# Patient Record
Sex: Female | Born: 1955 | Race: White | Hispanic: No | Smoking: Never smoker
Health system: Southern US, Community
[De-identification: ages and names within clinical notes are randomized; demographics above are authoritative.]

## PROBLEM LIST (undated history)

## (undated) DIAGNOSIS — F419 Anxiety disorder, unspecified: Secondary | ICD-10-CM

## (undated) DIAGNOSIS — E876 Hypokalemia: Secondary | ICD-10-CM

## (undated) DIAGNOSIS — F329 Major depressive disorder, single episode, unspecified: Secondary | ICD-10-CM

## (undated) DIAGNOSIS — R112 Nausea with vomiting, unspecified: Secondary | ICD-10-CM

## (undated) DIAGNOSIS — F32A Depression, unspecified: Secondary | ICD-10-CM

## (undated) DIAGNOSIS — Z9889 Other specified postprocedural states: Secondary | ICD-10-CM

## (undated) DIAGNOSIS — M47816 Spondylosis without myelopathy or radiculopathy, lumbar region: Secondary | ICD-10-CM

## (undated) DIAGNOSIS — R29898 Other symptoms and signs involving the musculoskeletal system: Secondary | ICD-10-CM

## (undated) DIAGNOSIS — G4730 Sleep apnea, unspecified: Secondary | ICD-10-CM

## (undated) DIAGNOSIS — K219 Gastro-esophageal reflux disease without esophagitis: Secondary | ICD-10-CM

## (undated) DIAGNOSIS — M549 Dorsalgia, unspecified: Secondary | ICD-10-CM

## (undated) DIAGNOSIS — I10 Essential (primary) hypertension: Secondary | ICD-10-CM

## (undated) HISTORY — DX: Depression, unspecified: F32.A

## (undated) HISTORY — PX: NECK SURGERY: SHX720

## (undated) HISTORY — PX: CHOLECYSTECTOMY: SHX55

## (undated) HISTORY — DX: Anxiety disorder, unspecified: F41.9

## (undated) HISTORY — DX: Other symptoms and signs involving the musculoskeletal system: R29.898

## (undated) HISTORY — PX: BACK SURGERY: SHX140

## (undated) HISTORY — DX: Dorsalgia, unspecified: M54.9

## (undated) HISTORY — DX: Major depressive disorder, single episode, unspecified: F32.9

## (undated) HISTORY — PX: ABDOMINAL HYSTERECTOMY: SHX81

## (undated) HISTORY — DX: Spondylosis without myelopathy or radiculopathy, lumbar region: M47.816

## (undated) HISTORY — PX: APPENDECTOMY: SHX54

---

## 1999-06-08 ENCOUNTER — Encounter: Payer: Self-pay | Admitting: Nephrology

## 1999-06-08 ENCOUNTER — Observation Stay (HOSPITAL_COMMUNITY): Admission: AD | Admit: 1999-06-08 | Discharge: 1999-06-09 | Payer: Self-pay | Admitting: Nephrology

## 2003-11-21 ENCOUNTER — Encounter: Admission: RE | Admit: 2003-11-21 | Discharge: 2003-11-21 | Payer: Self-pay | Admitting: Orthopedic Surgery

## 2010-06-22 ENCOUNTER — Emergency Department (HOSPITAL_BASED_OUTPATIENT_CLINIC_OR_DEPARTMENT_OTHER)
Admission: EM | Admit: 2010-06-22 | Discharge: 2010-06-22 | Disposition: A | Discharge status: Home / Self Care | Payer: Managed Care, Other (non HMO) | Attending: Emergency Medicine | Admitting: Emergency Medicine

## 2010-06-22 DIAGNOSIS — E119 Type 2 diabetes mellitus without complications: Secondary | ICD-10-CM | POA: Insufficient documentation

## 2010-06-22 DIAGNOSIS — E860 Dehydration: Secondary | ICD-10-CM | POA: Insufficient documentation

## 2010-06-22 DIAGNOSIS — I951 Orthostatic hypotension: Secondary | ICD-10-CM | POA: Insufficient documentation

## 2010-06-22 DIAGNOSIS — E785 Hyperlipidemia, unspecified: Secondary | ICD-10-CM | POA: Insufficient documentation

## 2010-06-22 DIAGNOSIS — J45909 Unspecified asthma, uncomplicated: Secondary | ICD-10-CM | POA: Insufficient documentation

## 2010-06-22 DIAGNOSIS — I10 Essential (primary) hypertension: Secondary | ICD-10-CM | POA: Insufficient documentation

## 2010-06-22 DIAGNOSIS — E876 Hypokalemia: Secondary | ICD-10-CM | POA: Insufficient documentation

## 2010-06-22 DIAGNOSIS — Z79899 Other long term (current) drug therapy: Secondary | ICD-10-CM | POA: Insufficient documentation

## 2010-06-22 DIAGNOSIS — G8929 Other chronic pain: Secondary | ICD-10-CM | POA: Insufficient documentation

## 2010-06-22 LAB — BASIC METABOLIC PANEL
BUN: 24 mg/dL — ABNORMAL HIGH (ref 6–23)
CO2: 27 mEq/L (ref 19–32)
Calcium: 11.1 mg/dL — ABNORMAL HIGH (ref 8.4–10.5)
Chloride: 86 mEq/L — ABNORMAL LOW (ref 96–112)
Creatinine, Ser: 0.9 mg/dL (ref 0.4–1.2)
GFR calc Af Amer: >60 mL/min (ref >60)
GFR calc non Af Amer: >60 mL/min (ref >60)
Glucose, Bld: 273 mg/dL — ABNORMAL HIGH (ref 70–99)
Potassium: 2.8 mEq/L — ABNORMAL LOW (ref 3.5–5.1)
Sodium: 132 mEq/L — ABNORMAL LOW (ref 135–145)

## 2010-06-22 LAB — CBC
HCT: 44.4 % (ref 36.0–46.0)
Hemoglobin: 15.6 g/dL — ABNORMAL HIGH (ref 12.0–15.0)
MCH: 28.7 pg (ref 26.0–34.0)
MCHC: 35.1 g/dL (ref 30.0–36.0)
MCV: 81.6 fL (ref 78.0–100.0)
Platelets: 385 10*3/uL (ref 150–400)
RBC: 5.44 MIL/uL — ABNORMAL HIGH (ref 3.87–5.11)
RDW: 13.1 % (ref 11.5–15.5)
WBC: 13.9 10*3/uL — ABNORMAL HIGH (ref 4.0–10.5)

## 2010-06-22 LAB — CK TOTAL AND CKMB (NOT AT ARMC)
CK, MB: 1.7 ng/mL (ref 0.3–4.0)
Relative Index: 1.4 (ref 0.0–2.5)
Total CK: 123 U/L (ref 7–177)

## 2010-06-22 LAB — URINALYSIS, ROUTINE W REFLEX MICROSCOPIC
Bilirubin Urine: NEGATIVE
Glucose, UA: 250 mg/dL — AB
Hgb urine dipstick: NEGATIVE
Ketones, ur: NEGATIVE mg/dL
Nitrite: NEGATIVE
Protein, ur: NEGATIVE mg/dL
Specific Gravity, Urine: 1.016 (ref 1.005–1.030)
Urobilinogen, UA: 0.2 mg/dL (ref 0.0–1.0)
pH: 8 (ref 5.0–8.0)

## 2010-06-22 LAB — MAGNESIUM: Magnesium: 1.7 mg/dL (ref 1.5–2.5)

## 2010-06-22 LAB — DIFFERENTIAL
Basophils Absolute: 0 10*3/uL (ref 0.0–0.1)
Basophils Relative: 0 % (ref 0–1)
Eosinophils Absolute: 0 10*3/uL (ref 0.0–0.7)
Eosinophils Relative: 0 % (ref 0–5)
Lymphocytes Relative: 21 % (ref 12–46)
Lymphs Abs: 2.9 10*3/uL (ref 0.7–4.0)
Monocytes Absolute: 0.8 10*3/uL (ref 0.1–1.0)
Monocytes Relative: 6 % (ref 3–12)
Neutro Abs: 10.2 10*3/uL — ABNORMAL HIGH (ref 1.7–7.7)
Neutrophils Relative %: 73 % (ref 43–77)

## 2010-06-22 LAB — TROPONIN I: Troponin I: <0.3 ng/mL (ref <0.30)

## 2010-06-22 LAB — GLUCOSE, CAPILLARY: Glucose-Capillary: 317 mg/dL — ABNORMAL HIGH (ref 70–99)

## 2010-09-15 ENCOUNTER — Encounter: Payer: Self-pay | Admitting: Emergency Medicine

## 2010-09-15 ENCOUNTER — Emergency Department (INDEPENDENT_AMBULATORY_CARE_PROVIDER_SITE_OTHER): Payer: Managed Care, Other (non HMO)

## 2010-09-15 ENCOUNTER — Emergency Department (HOSPITAL_BASED_OUTPATIENT_CLINIC_OR_DEPARTMENT_OTHER)
Admission: EM | Admit: 2010-09-15 | Discharge: 2010-09-15 | Disposition: A | Discharge status: Other Acute Inpatient Hospital | Payer: Managed Care, Other (non HMO) | Attending: Emergency Medicine | Admitting: Emergency Medicine

## 2010-09-15 ENCOUNTER — Other Ambulatory Visit: Payer: Self-pay

## 2010-09-15 DIAGNOSIS — I959 Hypotension, unspecified: Secondary | ICD-10-CM | POA: Insufficient documentation

## 2010-09-15 DIAGNOSIS — R109 Unspecified abdominal pain: Secondary | ICD-10-CM | POA: Insufficient documentation

## 2010-09-15 DIAGNOSIS — E119 Type 2 diabetes mellitus without complications: Secondary | ICD-10-CM | POA: Insufficient documentation

## 2010-09-15 DIAGNOSIS — R42 Dizziness and giddiness: Secondary | ICD-10-CM | POA: Insufficient documentation

## 2010-09-15 DIAGNOSIS — I10 Essential (primary) hypertension: Secondary | ICD-10-CM | POA: Insufficient documentation

## 2010-09-15 DIAGNOSIS — E876 Hypokalemia: Secondary | ICD-10-CM | POA: Insufficient documentation

## 2010-09-15 DIAGNOSIS — Z79899 Other long term (current) drug therapy: Secondary | ICD-10-CM | POA: Insufficient documentation

## 2010-09-15 DIAGNOSIS — R079 Chest pain, unspecified: Secondary | ICD-10-CM

## 2010-09-15 HISTORY — DX: Essential (primary) hypertension: I10

## 2010-09-15 HISTORY — DX: Gastro-esophageal reflux disease without esophagitis: K21.9

## 2010-09-15 HISTORY — DX: Hypokalemia: E87.6

## 2010-09-15 LAB — CBC
HCT: 41.6 % (ref 36.0–46.0)
Hemoglobin: 14.4 g/dL (ref 12.0–15.0)
MCH: 29.4 pg (ref 26.0–34.0)
MCHC: 34.6 g/dL (ref 30.0–36.0)
MCV: 85.1 fL (ref 78.0–100.0)
Platelets: 394 10*3/uL (ref 150–400)
RBC: 4.89 MIL/uL (ref 3.87–5.11)
RDW: 13.1 % (ref 11.5–15.5)
WBC: 9.8 10*3/uL (ref 4.0–10.5)

## 2010-09-15 LAB — URINALYSIS, ROUTINE W REFLEX MICROSCOPIC
Bilirubin Urine: NEGATIVE
Glucose, UA: NEGATIVE mg/dL
Hgb urine dipstick: NEGATIVE
Ketones, ur: NEGATIVE mg/dL
Leukocytes, UA: NEGATIVE
Nitrite: NEGATIVE
Protein, ur: NEGATIVE mg/dL
Specific Gravity, Urine: 1.008 (ref 1.005–1.030)
Urobilinogen, UA: 0.2 mg/dL (ref 0.0–1.0)
pH: 7.5 (ref 5.0–8.0)

## 2010-09-15 LAB — DIFFERENTIAL
Basophils Absolute: 0 10*3/uL (ref 0.0–0.1)
Basophils Relative: 0 % (ref 0–1)
Eosinophils Absolute: 0.2 10*3/uL (ref 0.0–0.7)
Eosinophils Relative: 2 % (ref 0–5)
Lymphocytes Relative: 40 % (ref 12–46)
Lymphs Abs: 3.9 10*3/uL (ref 0.7–4.0)
Monocytes Absolute: 0.8 10*3/uL (ref 0.1–1.0)
Monocytes Relative: 8 % (ref 3–12)
Neutro Abs: 4.9 10*3/uL (ref 1.7–7.7)
Neutrophils Relative %: 49 % (ref 43–77)

## 2010-09-15 LAB — COMPREHENSIVE METABOLIC PANEL
ALT: 20 U/L (ref 0–35)
AST: 31 U/L (ref 0–37)
Albumin: 4.2 g/dL (ref 3.5–5.2)
Alkaline Phosphatase: 84 U/L (ref 39–117)
BUN: 12 mg/dL (ref 6–23)
CO2: 32 mEq/L (ref 19–32)
Calcium: 9.9 mg/dL (ref 8.4–10.5)
Chloride: 95 mEq/L — ABNORMAL LOW (ref 96–112)
Creatinine, Ser: 0.7 mg/dL (ref 0.50–1.10)
GFR calc Af Amer: >60 mL/min (ref >60)
GFR calc non Af Amer: >60 mL/min (ref >60)
Glucose, Bld: 94 mg/dL (ref 70–99)
Potassium: 2.9 mEq/L — ABNORMAL LOW (ref 3.5–5.1)
Sodium: 138 mEq/L (ref 135–145)
Total Bilirubin: 0.4 mg/dL (ref 0.3–1.2)
Total Protein: 7.8 g/dL (ref 6.0–8.3)

## 2010-09-15 LAB — POTASSIUM: Potassium: 2.9 mEq/L — ABNORMAL LOW (ref 3.5–5.1)

## 2010-09-15 MED ORDER — SODIUM CHLORIDE 0.9 % IV BOLUS (SEPSIS)
1000.0000 mL | Freq: Once | INTRAVENOUS | Status: AC
  Administered 2010-09-15: 1000 mL via INTRAVENOUS

## 2010-09-15 MED ORDER — POTASSIUM CHLORIDE 10 MEQ/100ML IV SOLN
10.0000 meq | Freq: Once | INTRAVENOUS | Status: AC
  Administered 2010-09-15: 10 meq via INTRAVENOUS
  Filled 2010-09-15: qty 100

## 2010-09-15 NOTE — ED Notes (Signed)
Pt ambulatory to [facility] with asst.; sts she feels "wobbly" when ambulating

## 2010-09-15 NOTE — ED Provider Notes (Signed)
History     CSN: [id number] Arrival date & time: 09/15/2010 11:18 AM  Chief Complaint  Patient presents with  . Abdominal Pain   HPI Comments: Pt states that over the last several days she has had dizziness,nausea and weakness. Pt states that last week she was admitted to [facility] for a syncopal episode and was told that she had low potassium:pt states that she has been taking supplement and had lab work drawn by pcp 2 days ago and was told that it was back down to 3:pt states that she is having muscle cramping and nausea from taking the potasium. Pt denies abdominal pain she just feels sick to her stomach.Pt denies fever, cp.  The history is provided by the patient. No language interpreter was used.    Past Medical History  Diagnosis Date  . Diabetes mellitus   . Hypokalemia   . GERD (gastroesophageal reflux disease)   . Asthma   . Hypertension     Past Surgical History  Procedure Date  . Cholecystectomy   . Appendectomy   . Abdominal hysterectomy   . Back surgery   . Neck surgery     No family history on file.  History  Substance Use Topics  . Smoking status: Never Smoker   . Smokeless tobacco: Not on file  . Alcohol Use: No    OB History    Grav Para Term Preterm Abortions TAB SAB Ect Mult Living                  Review of Systems  All other systems reviewed and are negative.    Physical Exam  BP 91/61  Pulse 78  Temp(Src) 98 F (36.7 C) (Oral)  Resp 16  Ht 5\' 3"  (1.6 m)  Wt 162 lb (73.483 kg)  BMI 28.70 kg/m2  SpO2 98%  Physical Exam  Nursing note and vitals reviewed. Constitutional: She appears well-developed and well-nourished.  HENT:  Head: Normocephalic and atraumatic.  Eyes: Pupils are equal, round, and reactive to light.  Neck: Normal range of motion. Neck supple.  Cardiovascular: Regular rhythm.   Pulmonary/Chest: Breath sounds normal.  Abdominal: Soft. Bowel sounds are normal.  Musculoskeletal: Normal range of motion.    Neurological: She is alert. Coordination normal.  Skin: Skin is warm and dry.  Psychiatric: She has a normal mood and affect.    ED Course  Procedures  Date: 09/15/2010  Rate: 69  Rhythm: normal sinus rhythm  QRS Axis: normal  Intervals: normal  ST/T Wave abnormalities: normal  Conduction Disutrbances:none  Narrative Interpretation:   Old EKG Reviewed: none available  Results for orders placed during the hospital encounter of 09/15/10  CBC      Component Value Range   WBC 9.8  4.0 - 10.5 (K/uL)   RBC 4.89  3.87 - 5.11 (MIL/uL)   Hemoglobin 14.4  12.0 - 15.0 (g/dL)   HCT 40.9  81.1 - 91.4 (%)   MCV 85.1  78.0 - 100.0 (fL)   MCH 29.4  26.0 - 34.0 (pg)   MCHC 34.6  30.0 - 36.0 (g/dL)   RDW 78.2  95.6 - 21.3 (%)   Platelets 394  150 - 400 (K/uL)  DIFFERENTIAL      Component Value Range   Neutrophils Relative 49  43 - 77 (%)   Neutro Abs 4.9  1.7 - 7.7 (K/uL)   Lymphocytes Relative 40  12 - 46 (%)   Lymphs Abs 3.9  0.7 - 4.0 (K/uL)  Monocytes Relative 8  3 - 12 (%)   Monocytes Absolute 0.8  0.1 - 1.0 (K/uL)   Eosinophils Relative 2  0 - 5 (%)   Eosinophils Absolute 0.2  0.0 - 0.7 (K/uL)   Basophils Relative 0  0 - 1 (%)   Basophils Absolute 0.0  0.0 - 0.1 (K/uL)  COMPREHENSIVE METABOLIC PANEL      Component Value Range   Sodium 138  135 - 145 (mEq/L)   Potassium 2.9 (*) 3.5 - 5.1 (mEq/L)   Chloride 95 (*) 96 - 112 (mEq/L)   CO2 32  19 - 32 (mEq/L)   Glucose, Bld 94  70 - 99 (mg/dL)   BUN 12  6 - 23 (mg/dL)   Creatinine, Ser 1.61  0.50 - 1.10 (mg/dL)   Calcium 9.9  8.4 - 09.6 (mg/dL)   Total Protein 7.8  6.0 - 8.3 (g/dL)   Albumin 4.2  3.5 - 5.2 (g/dL)   AST 31  0 - 37 (U/L)   ALT 20  0 - 35 (U/L)   Alkaline Phosphatase 84  39 - 117 (U/L)   Total Bilirubin 0.4  0.3 - 1.2 (mg/dL)   GFR calc non Af Amer >60  >60 (mL/min)   GFR calc Af Amer >60  >60 (mL/min)  URINALYSIS, ROUTINE W REFLEX MICROSCOPIC      Component Value Range   Color, Urine YELLOW  YELLOW     Appearance CLEAR  CLEAR    Specific Gravity, Urine 1.008  1.005 - 1.030    pH 7.5  5.0 - 8.0    Glucose, UA NEGATIVE  NEGATIVE (mg/dL)   Hgb urine dipstick NEGATIVE  NEGATIVE    Bilirubin Urine NEGATIVE  NEGATIVE    Ketones, ur NEGATIVE  NEGATIVE (mg/dL)   Protein, ur NEGATIVE  NEGATIVE (mg/dL)   Urobilinogen, UA 0.2  0.0 - 1.0 (mg/dL)   Nitrite NEGATIVE  NEGATIVE    Leukocytes, UA NEGATIVE  NEGATIVE      Results for orders placed during the hospital encounter of 09/15/10  CBC      Component Value Range   WBC 9.8  4.0 - 10.5 (K/uL)   RBC 4.89  3.87 - 5.11 (MIL/uL)   Hemoglobin 14.4  12.0 - 15.0 (g/dL)   HCT 04.5  40.9 - 81.1 (%)   MCV 85.1  78.0 - 100.0 (fL)   MCH 29.4  26.0 - 34.0 (pg)   MCHC 34.6  30.0 - 36.0 (g/dL)   RDW 91.4  78.2 - 95.6 (%)   Platelets 394  150 - 400 (K/uL)  DIFFERENTIAL      Component Value Range   Neutrophils Relative 49  43 - 77 (%)   Neutro Abs 4.9  1.7 - 7.7 (K/uL)   Lymphocytes Relative 40  12 - 46 (%)   Lymphs Abs 3.9  0.7 - 4.0 (K/uL)   Monocytes Relative 8  3 - 12 (%)   Monocytes Absolute 0.8  0.1 - 1.0 (K/uL)   Eosinophils Relative 2  0 - 5 (%)   Eosinophils Absolute 0.2  0.0 - 0.7 (K/uL)   Basophils Relative 0  0 - 1 (%)   Basophils Absolute 0.0  0.0 - 0.1 (K/uL)  COMPREHENSIVE METABOLIC PANEL      Component Value Range   Sodium 138  135 - 145 (mEq/L)   Potassium 2.9 (*) 3.5 - 5.1 (mEq/L)   Chloride 95 (*) 96 - 112 (mEq/L)   CO2 32  19 - 32 (  mEq/L)   Glucose, Bld 94  70 - 99 (mg/dL)   BUN 12  6 - 23 (mg/dL)   Creatinine, Ser 1.61  0.50 - 1.10 (mg/dL)   Calcium 9.9  8.4 - 09.6 (mg/dL)   Total Protein 7.8  6.0 - 8.3 (g/dL)   Albumin 4.2  3.5 - 5.2 (g/dL)   AST 31  0 - 37 (U/L)   ALT 20  0 - 35 (U/L)   Alkaline Phosphatase 84  39 - 117 (U/L)   Total Bilirubin 0.4  0.3 - 1.2 (mg/dL)   GFR calc non Af Amer >60  >60 (mL/min)   GFR calc Af Amer >60  >60 (mL/min)  URINALYSIS, ROUTINE W REFLEX MICROSCOPIC      Component Value Range    Color, Urine YELLOW  YELLOW    Appearance CLEAR  CLEAR    Specific Gravity, Urine 1.008  1.005 - 1.030    pH 7.5  5.0 - 8.0    Glucose, UA NEGATIVE  NEGATIVE (mg/dL)   Hgb urine dipstick NEGATIVE  NEGATIVE    Bilirubin Urine NEGATIVE  NEGATIVE    Ketones, ur NEGATIVE  NEGATIVE (mg/dL)   Protein, ur NEGATIVE  NEGATIVE (mg/dL)   Urobilinogen, UA 0.2  0.0 - 1.0 (mg/dL)   Nitrite NEGATIVE  NEGATIVE    Leukocytes, UA NEGATIVE  NEGATIVE    Dg Chest 2 View  09/15/2010  *RADIOLOGY REPORT*  Clinical Data: Dizziness.  Abdominal pain and cramping.  Chest pain.  CHEST - 2 VIEW  Comparison: None.  Findings: Lower cervical spine fixation.  Cholecystectomy clips. Midline trachea.  Normal heart size and mediastinal contours. No pleural effusion or pneumothorax.  Mildly low lung volumes. Clear lungs.  No free intraperitoneal air.  IMPRESSION: Mildly low lung volumes without acute disease.  Original Report Authenticated By: Consuello Bossier, M.D.    MDM Pt given a run of potassium and is taking 40 bid at home with continued low potassium:pt blood pressure here after 2 liters continues to be 85:will call [facility] to get admitted

## 2010-09-15 NOTE — ED Notes (Signed)
Pt reports she is having persistent abd pain & nausea, feels weak; was admitted to [facility] Sunday after syncopal episode- was found to have low K+; saw PMD Thurs, K+ was 3.0

## 2010-09-15 NOTE — ED Notes (Signed)
PCP Zollie [id number] [facility]

## 2010-09-15 NOTE — ED Notes (Signed)
Pt asymptomatic with hypotension.  Rubin Payor, NP made aware- orders received. NS rate increased to 999/hr.

## 2010-09-16 NOTE — ED Provider Notes (Signed)
Medical screening examination/treatment/procedure(s) were performed by non-physician practitioner and as supervising physician I was immediately available for consultation/collaboration.   Charles B. Bernette Mayers, MD 09/16/10 [phone]

## 2010-09-16 NOTE — ED Provider Notes (Signed)
Medical screening examination/treatment/procedure(s) were performed by non-physician practitioner and as supervising physician I was immediately available for consultation/collaboration.   Stefhanie Kachmar B. Bernette Mayers, MD 09/16/10 [phone]

## 2012-03-25 ENCOUNTER — Emergency Department (HOSPITAL_BASED_OUTPATIENT_CLINIC_OR_DEPARTMENT_OTHER)
Admission: EM | Admit: 2012-03-25 | Discharge: 2012-03-25 | Disposition: A | Discharge status: Home / Self Care | Payer: Managed Care, Other (non HMO) | Attending: Emergency Medicine | Admitting: Emergency Medicine

## 2012-03-25 ENCOUNTER — Encounter (HOSPITAL_BASED_OUTPATIENT_CLINIC_OR_DEPARTMENT_OTHER): Payer: Self-pay

## 2012-03-25 ENCOUNTER — Emergency Department (HOSPITAL_BASED_OUTPATIENT_CLINIC_OR_DEPARTMENT_OTHER): Payer: Managed Care, Other (non HMO)

## 2012-03-25 DIAGNOSIS — E876 Hypokalemia: Secondary | ICD-10-CM | POA: Insufficient documentation

## 2012-03-25 DIAGNOSIS — R197 Diarrhea, unspecified: Secondary | ICD-10-CM | POA: Insufficient documentation

## 2012-03-25 DIAGNOSIS — Z79899 Other long term (current) drug therapy: Secondary | ICD-10-CM | POA: Insufficient documentation

## 2012-03-25 DIAGNOSIS — Z794 Long term (current) use of insulin: Secondary | ICD-10-CM | POA: Insufficient documentation

## 2012-03-25 DIAGNOSIS — E119 Type 2 diabetes mellitus without complications: Secondary | ICD-10-CM | POA: Insufficient documentation

## 2012-03-25 DIAGNOSIS — R1110 Vomiting, unspecified: Secondary | ICD-10-CM

## 2012-03-25 DIAGNOSIS — I10 Essential (primary) hypertension: Secondary | ICD-10-CM | POA: Insufficient documentation

## 2012-03-25 DIAGNOSIS — J45909 Unspecified asthma, uncomplicated: Secondary | ICD-10-CM | POA: Insufficient documentation

## 2012-03-25 DIAGNOSIS — K219 Gastro-esophageal reflux disease without esophagitis: Secondary | ICD-10-CM | POA: Insufficient documentation

## 2012-03-25 DIAGNOSIS — R112 Nausea with vomiting, unspecified: Secondary | ICD-10-CM | POA: Insufficient documentation

## 2012-03-25 DIAGNOSIS — R109 Unspecified abdominal pain: Secondary | ICD-10-CM | POA: Insufficient documentation

## 2012-03-25 LAB — CBC WITH DIFFERENTIAL/PLATELET
Eosinophils Absolute: 0.1 10*3/uL (ref 0.0–0.7)
HCT: 42.1 % (ref 36.0–46.0)
Hemoglobin: 14.4 g/dL (ref 12.0–15.0)
Lymphs Abs: 4.3 10*3/uL — ABNORMAL HIGH (ref 0.7–4.0)
MCH: 29.9 pg (ref 26.0–34.0)
Monocytes Absolute: 0.6 10*3/uL (ref 0.1–1.0)
Monocytes Relative: 7 % (ref 3–12)
Neutro Abs: 3.4 10*3/uL (ref 1.7–7.7)
Neutrophils Relative %: 41 % — ABNORMAL LOW (ref 43–77)
RBC: 4.81 MIL/uL (ref 3.87–5.11)

## 2012-03-25 LAB — URINALYSIS, ROUTINE W REFLEX MICROSCOPIC
Bilirubin Urine: NEGATIVE
Glucose, UA: NEGATIVE mg/dL
Hgb urine dipstick: NEGATIVE
Specific Gravity, Urine: 1.005 (ref 1.005–1.030)

## 2012-03-25 LAB — COMPREHENSIVE METABOLIC PANEL
AST: 23 U/L (ref 0–37)
CO2: 29 mEq/L (ref 19–32)
Calcium: 9.9 mg/dL (ref 8.4–10.5)
Creatinine, Ser: 0.8 mg/dL (ref 0.50–1.10)
GFR calc non Af Amer: 81 mL/min — ABNORMAL LOW (ref >90)
Sodium: 139 mEq/L (ref 135–145)
Total Protein: 7.3 g/dL (ref 6.0–8.3)

## 2012-03-25 LAB — LIPASE, BLOOD: Lipase: 18 U/L (ref 11–59)

## 2012-03-25 MED ORDER — IOHEXOL 300 MG/ML  SOLN
50.0000 mL | Freq: Once | INTRAMUSCULAR | Status: AC | PRN
  Administered 2012-03-25: 50 mL via ORAL

## 2012-03-25 MED ORDER — ONDANSETRON HCL 4 MG/2ML IJ SOLN
4.0000 mg | Freq: Once | INTRAMUSCULAR | Status: AC
  Administered 2012-03-25: 4 mg via INTRAVENOUS
  Filled 2012-03-25: qty 2

## 2012-03-25 NOTE — ED Notes (Signed)
Pt states that she has sore throat, abdominal pain in bilateral lower abdomen, n/v with constipation.  Denies fever.  Pt has been seen by PCP, had EGD (03/17/2012) which showed gastroparesis (retained food noted in stomach at time of exam) and US performed on 03/24/2012.  Pt states that she wants a CT scan of her abdomen and IVF as per her discussion with PCP.

## 2012-03-25 NOTE — ED Provider Notes (Signed)
History     CSN: [id number]  Arrival date & time 03/25/12  1201   First MD Initiated Contact with Patient 03/25/12 1220      Chief Complaint  Patient presents with  . Abdominal Pain    (Consider location/radiation/quality/duration/timing/severity/associated sxs/prior treatment) HPI Comments: Pt states that she has had vomiting and diarrhea but in the last couple of days she has had vomiting:pt states that she has had ultrasound and egd from doctors and nothing was found:pt states that she was sent over by her pcp to have ultrasound:  Patient is a 57 y.o. female presenting with abdominal pain. The history is provided by the patient. No language interpreter was used.  Abdominal Pain Pain location:  LLQ and RLQ Pain quality: aching   Pain severity:  Moderate Onset quality:  Gradual Duration:  3 weeks Timing:  Intermittent Associated symptoms: diarrhea, nausea and vomiting   Associated symptoms: no fever     Past Medical History  Diagnosis Date  . Diabetes mellitus   . Hypokalemia   . GERD (gastroesophageal reflux disease)   . Asthma   . Hypertension     Past Surgical History  Procedure Laterality Date  . Cholecystectomy    . Appendectomy    . Abdominal hysterectomy    . Back surgery    . Neck surgery      History reviewed. No pertinent family history.  History  Substance Use Topics  . Smoking status: Never Smoker   . Smokeless tobacco: Never Used  . Alcohol Use: No    OB History   Grav Para Term Preterm Abortions TAB SAB Ect Mult Living                  Review of Systems  Constitutional: Negative for fever.  Respiratory: Negative.   Cardiovascular: Negative.   Gastrointestinal: Positive for nausea, vomiting, abdominal pain and diarrhea.    Allergies  Clarithromycin; Codeine; Ibuprofen; Darvocet; Other; Ranitidine hcl; Stadol; and Sulfa antibiotics  Home Medications   Current Outpatient Rx  Name  Route  Sig  Dispense  Refill  . acyclovir  (ZOVIRAX) 400 MG tablet   Oral   Take 400 mg by mouth 2 (two) times daily.           Marland Kitchen atenolol (TENORMIN) 25 MG tablet   Oral   Take 25 mg by mouth daily.           . budesonide-formoterol (SYMBICORT) 80-4.5 MCG/ACT inhaler   Inhalation   Inhale 2 puffs into the lungs 2 (two) times daily.           Marland Kitchen dexlansoprazole (DEXILANT) 60 MG capsule   Oral   Take 60 mg by mouth daily.           Marland Kitchen doxepin (SINEQUAN) 25 MG capsule   Oral   Take 25 mg by mouth at bedtime.           Marland Kitchen estradiol (CLIMARA) 0.1 mg/24hr   Transdermal   Place 1 patch onto the skin once a week.           . estrogen-methylTESTOSTERone (ESTRATEST) 1.25-2.5 MG per tablet   Oral   Take 1 tablet by mouth daily.           Marland Kitchen gabapentin (NEURONTIN) 600 MG tablet   Oral   Take 1,200 mg by mouth 2 (two) times daily.           Marland Kitchen guaifenesin (HUMIBID E) 400 MG TABS   Oral  Take 400 mg by mouth 2 (two) times daily.           . insulin aspart (NOVOLOG) 100 UNIT/ML injection   Subcutaneous   Inject into the skin.           Marland Kitchen levocetirizine (XYZAL) 5 MG tablet   Oral   Take 5 mg by mouth daily.           . metolazone (ZAROXOLYN) 2.5 MG tablet   Oral   Take 2.5 mg by mouth 2 (two) times daily.           . montelukast (SINGULAIR) 10 MG tablet   Oral   Take 10 mg by mouth daily.           . phentermine 37.5 MG capsule   Oral   Take 37.5 mg by mouth every morning.         . potassium chloride SA (K-DUR,KLOR-CON) 20 MEQ tablet   Oral   Take 20 mEq by mouth 2 (two) times daily.           . rosuvastatin (CRESTOR) 10 MG tablet   Oral   Take 10 mg by mouth daily.           Marland Kitchen topiramate (TOPAMAX) 25 MG capsule   Oral   Take 25 mg by mouth 2 (two) times daily.         Marland Kitchen esomeprazole (NEXIUM) 40 MG capsule   Oral   Take 40 mg by mouth at bedtime.           Marland Kitchen lubiprostone (AMITIZA) 8 MCG capsule   Oral   Take 8 mcg by mouth 2 (two) times daily with a meal.              BP 110/73  Pulse 74  Temp(Src) 97.5 F (36.4 C) (Oral)  Resp 18  Ht 5\' 3"  (1.6 m)  Wt 166 lb (75.297 kg)  BMI 29.41 kg/m2  SpO2 100%  Physical Exam  Nursing note and vitals reviewed. Constitutional: She is oriented to person, place, and time. She appears well-developed and well-nourished.  HENT:  Head: Normocephalic and atraumatic.  Eyes: Conjunctivae and EOM are normal.  Neck: Normal range of motion. Neck supple.  Cardiovascular: Normal rate and regular rhythm.   Pulmonary/Chest: Effort normal and breath sounds normal.  Abdominal: Soft. Bowel sounds are normal. There is no tenderness.  Musculoskeletal: Normal range of motion.  Neurological: She is alert and oriented to person, place, and time.  Skin: Skin is warm and dry.  Psychiatric: She has a normal mood and affect.    ED Course  Procedures (including critical care time)  Labs Reviewed  COMPREHENSIVE METABOLIC PANEL - Abnormal; Notable for the following:    Potassium 3.0 (*)    Glucose, Bld 119 (*)    GFR calc non Af Amer 81 (*)    All other components within normal limits  CBC WITH DIFFERENTIAL - Abnormal; Notable for the following:    Neutrophils Relative 41 (*)    Lymphocytes Relative 51 (*)    Lymphs Abs 4.3 (*)    All other components within normal limits  URINALYSIS, ROUTINE W REFLEX MICROSCOPIC  LIPASE, BLOOD   Ct Abdomen Pelvis Wo Contrast  03/25/2012  *RADIOLOGY REPORT*  Clinical Data: Vomiting and diarrhea since endoscopy on 03/17/2012.  CT ABDOMEN AND PELVIS WITHOUT CONTRAST  Technique:  Multidetector CT imaging of the abdomen and pelvis was performed following the standard protocol without intravenous contrast.  Comparison: None.  Findings: Lung bases are clear.  No pleural or pericardial fluid. Coronary artery calcification is noted.  The liver has a normal appearance without contrast.  There has been previous cholecystectomy.  The spleen is normal.  The pancreas is normal.  There are surgical clips  in the region of the pancreatic head.  The adrenal glands are normal.  The kidneys are normal.  No cyst, mass, stone or hydronephrosis.  The aorta and IVC are unremarkable.  No retroperitoneal mass or adenopathy.  No free intraperitoneal fluid or air.  There is a large amount of fecal matter in the colon.  Small bowel appears unremarkable.  There is been previous hysterectomy.  Bladder appears normal.  Ordinary degenerative changes effect the lower lumbar spine.  IMPRESSION: No acute finding.  Moderate amount of fecal matter throughout the colon.   Original Report Authenticated By: Paulina Fusi, M.D.      1. Abdominal pain   2. Vomiting       MDM  No acute abdominal finding noted:pt is okay to follow up with pcp:pt has not had vomiting here:pt has potasium at home        Teressa Lower, NP 03/25/12 1532

## 2012-03-26 NOTE — ED Provider Notes (Signed)
Medical screening examination/treatment/procedure(s) were performed by non-physician practitioner and as supervising physician I was immediately available for consultation/collaboration.  Taleigh Gero, MD 03/26/12 0724 

## 2014-06-02 DIAGNOSIS — Z87898 Personal history of other specified conditions: Secondary | ICD-10-CM | POA: Insufficient documentation

## 2014-07-10 DIAGNOSIS — F4329 Adjustment disorder with other symptoms: Secondary | ICD-10-CM | POA: Insufficient documentation

## 2014-07-10 DIAGNOSIS — F419 Anxiety disorder, unspecified: Secondary | ICD-10-CM | POA: Insufficient documentation

## 2014-07-10 DIAGNOSIS — R079 Chest pain, unspecified: Secondary | ICD-10-CM | POA: Insufficient documentation

## 2014-07-10 DIAGNOSIS — J45909 Unspecified asthma, uncomplicated: Secondary | ICD-10-CM | POA: Insufficient documentation

## 2014-07-10 DIAGNOSIS — E1169 Type 2 diabetes mellitus with other specified complication: Secondary | ICD-10-CM | POA: Insufficient documentation

## 2014-07-10 DIAGNOSIS — E039 Hypothyroidism, unspecified: Secondary | ICD-10-CM | POA: Insufficient documentation

## 2014-07-10 DIAGNOSIS — F4381 Prolonged grief disorder: Secondary | ICD-10-CM | POA: Insufficient documentation

## 2014-07-16 ENCOUNTER — Encounter (HOSPITAL_BASED_OUTPATIENT_CLINIC_OR_DEPARTMENT_OTHER): Payer: Self-pay | Admitting: *Deleted

## 2014-07-16 ENCOUNTER — Emergency Department (HOSPITAL_BASED_OUTPATIENT_CLINIC_OR_DEPARTMENT_OTHER)
Admission: EM | Admit: 2014-07-16 | Discharge: 2014-07-16 | Disposition: A | Discharge status: Home / Self Care | Payer: Medicare HMO | Attending: Emergency Medicine | Admitting: Emergency Medicine

## 2014-07-16 DIAGNOSIS — E119 Type 2 diabetes mellitus without complications: Secondary | ICD-10-CM | POA: Insufficient documentation

## 2014-07-16 DIAGNOSIS — Z794 Long term (current) use of insulin: Secondary | ICD-10-CM | POA: Diagnosis not present

## 2014-07-16 DIAGNOSIS — Z79899 Other long term (current) drug therapy: Secondary | ICD-10-CM | POA: Insufficient documentation

## 2014-07-16 DIAGNOSIS — S61211A Laceration without foreign body of left index finger without damage to nail, initial encounter: Secondary | ICD-10-CM | POA: Insufficient documentation

## 2014-07-16 DIAGNOSIS — I10 Essential (primary) hypertension: Secondary | ICD-10-CM | POA: Diagnosis not present

## 2014-07-16 DIAGNOSIS — Y9289 Other specified places as the place of occurrence of the external cause: Secondary | ICD-10-CM | POA: Diagnosis not present

## 2014-07-16 DIAGNOSIS — Y998 Other external cause status: Secondary | ICD-10-CM | POA: Diagnosis not present

## 2014-07-16 DIAGNOSIS — K219 Gastro-esophageal reflux disease without esophagitis: Secondary | ICD-10-CM | POA: Diagnosis not present

## 2014-07-16 DIAGNOSIS — E876 Hypokalemia: Secondary | ICD-10-CM | POA: Diagnosis not present

## 2014-07-16 DIAGNOSIS — W260XXA Contact with knife, initial encounter: Secondary | ICD-10-CM | POA: Insufficient documentation

## 2014-07-16 DIAGNOSIS — Y9389 Activity, other specified: Secondary | ICD-10-CM | POA: Insufficient documentation

## 2014-07-16 DIAGNOSIS — J45909 Unspecified asthma, uncomplicated: Secondary | ICD-10-CM | POA: Insufficient documentation

## 2014-07-16 NOTE — ED Notes (Signed)
Pt cut her right index finger and had it evaluated adn steri strips were placed.  Pt is concerned due to continued oozing and pain in her forearm.  Site is dry, steri strips in place, no bleeding, no s/s of infection.

## 2014-07-16 NOTE — Discharge Instructions (Signed)
Leave Steri-Strips in place until they follow off on their own.  Keep the wound clean and dry.  Follow-up with your primary Dr. next week for a wound check, and return to the ER if you develop increased redness, pus draining from the wound, or other new and concerning symptoms.   Laceration Care, Adult A laceration is a cut or lesion that goes through all layers of the skin and into the tissue just beneath the skin. TREATMENT  Some lacerations may not require closure. Some lacerations may not be able to be closed due to an increased risk of infection. It is important to see your caregiver as soon as possible after an injury to minimize the risk of infection and maximize the opportunity for successful closure. If closure is appropriate, pain medicines may be given, if needed. The wound will be cleaned to help prevent infection. Your caregiver will use stitches (sutures), staples, wound glue (adhesive), or skin adhesive strips to repair the laceration. These tools bring the skin edges together to allow for faster healing and a better cosmetic outcome. However, all wounds will heal with a scar. Once the wound has healed, scarring can be minimized by covering the wound with sunscreen during the day for 1 full year. HOME CARE INSTRUCTIONS  For sutures or staples:  Keep the wound clean and dry.  If you were given a bandage (dressing), you should change it at least once a day. Also, change the dressing if it becomes wet or dirty, or as directed by your caregiver.  Wash the wound with soap and water 2 times a day. Rinse the wound off with water to remove all soap. Pat the wound dry with a clean towel.  After cleaning, apply a thin layer of the antibiotic ointment as recommended by your caregiver. This will help prevent infection and keep the dressing from sticking.  You may shower as usual after the first 24 hours. Do not soak the wound in water until the sutures are removed.  Only take  over-the-counter or prescription medicines for pain, discomfort, or fever as directed by your caregiver.  Get your sutures or staples removed as directed by your caregiver. For skin adhesive strips:  Keep the wound clean and dry.  Do not get the skin adhesive strips wet. You may bathe carefully, using caution to keep the wound dry.  If the wound gets wet, pat it dry with a clean towel.  Skin adhesive strips will fall off on their own. You may trim the strips as the wound heals. Do not remove skin adhesive strips that are still stuck to the wound. They will fall off in time. For wound adhesive:  You may briefly wet your wound in the shower or bath. Do not soak or scrub the wound. Do not swim. Avoid periods of heavy perspiration until the skin adhesive has fallen off on its own. After showering or bathing, gently pat the wound dry with a clean towel.  Do not apply liquid medicine, cream medicine, or ointment medicine to your wound while the skin adhesive is in place. This may loosen the film before your wound is healed.  If a dressing is placed over the wound, be careful not to apply tape directly over the skin adhesive. This may cause the adhesive to be pulled off before the wound is healed.  Avoid prolonged exposure to sunlight or tanning lamps while the skin adhesive is in place. Exposure to ultraviolet light in the first year will darken the  scar. °· The skin adhesive will usually remain in place for 5 to 10 days, then naturally fall off the skin. Do not pick at the adhesive film. °You may need a tetanus shot if: °· You cannot remember when you had your last tetanus shot. °· You have never had a tetanus shot. °If you get a tetanus shot, your arm may swell, get red, and feel warm to the touch. This is common and not a problem. If you need a tetanus shot and you choose not to have one, there is a rare chance of getting tetanus. Sickness from tetanus can be serious. °SEEK MEDICAL CARE IF:  °· You  have redness, swelling, or increasing pain in the wound. °· You see a red line that goes away from the wound. °· You have yellowish-white fluid (pus) coming from the wound. °· You have a fever. °· You notice a bad smell coming from the wound or dressing. °· Your wound breaks open before or after sutures have been removed. °· You notice something coming out of the wound such as wood or glass. °· Your wound is on your hand or foot and you cannot move a finger or toe. °SEEK IMMEDIATE MEDICAL CARE IF:  °· Your pain is not controlled with prescribed medicine. °· You have severe swelling around the wound causing pain and numbness or a change in color in your arm, hand, leg, or foot. °· Your wound splits open and starts bleeding. °· You have worsening numbness, weakness, or loss of function of any joint around or beyond the wound. °· You develop painful lumps near the wound or on the skin anywhere on your body. °MAKE SURE YOU:  °· Understand these instructions. °· Will watch your condition. °· Will get help right away if you are not doing well or get worse. °Document Released: 01/28/2005 Document Revised: 04/22/2011 Document Reviewed: 07/24/2010 °[employer]® Patient Information ©2015 [employer]. This information is not intended to replace advice given to you by your health care provider. Make sure you discuss any questions you have with your health care provider. ° °

## 2014-07-16 NOTE — ED Provider Notes (Signed)
CSN: [id number]     Arrival date & time 07/16/14  1345 History  This chart was scribed for Geoffery Lyonsouglas Marks Scalera, MD by Lyndel SafeKaitlyn Shelton, ED Scribe. This patient was seen in room MHFT1/MHFT1 and the patient's care was started 4:00 PM.   Chief Complaint  Patient presents with  . Extremity Laceration   Patient is a 59 y.o. female presenting with skin laceration. The history is provided by the patient. No language interpreter was used.  Laceration Location:  Finger Finger laceration location:  L index finger Depth:  Cutaneous Bleeding: controlled   Time since incident:  20 hours Laceration mechanism:  Knife   HPI Comments: Carolyn Benson is a 59 y.o. female who presents to the Emergency Department complaining of a laceration to her left index finger that occurred 20 hours ago. She reports associated swelling, numbness, a cold feeling, pain and bleeding in her finger, and pains shooting down her left hand/wrist. The pt states she was opening a box with a steak knife when she cut her finger. She went to [facility] and they did not perform sutures due to the amount of hours since the laceration, per patient.  Past Medical History  Diagnosis Date  . Diabetes mellitus   . Hypokalemia   . GERD (gastroesophageal reflux disease)   . Asthma   . Hypertension    Past Surgical History  Procedure Laterality Date  . Cholecystectomy    . Appendectomy    . Abdominal hysterectomy    . Back surgery    . Neck surgery     No family history on file. History  Substance Use Topics  . Smoking status: Never Smoker   . Smokeless tobacco: Never Used  . Alcohol Use: No   OB History    No data available     Review of Systems  Constitutional: Negative for fever.  Skin: Positive for wound.   A complete 10 system review of systems was obtained and is otherwise negative except at noted in the HPI and PMH.  Allergies  Clarithromycin; Codeine; Ibuprofen; Omnipaque; Darvocet; Other; Ranitidine hcl; Stadol; and  Sulfa antibiotics  Home Medications   Prior to Admission medications   Medication Sig Start Date End Date Taking? Authorizing Provider  acyclovir (ZOVIRAX) 400 MG tablet Take 400 mg by mouth 2 (two) times daily.      Historical Provider, MD  atenolol (TENORMIN) 25 MG tablet Take 25 mg by mouth daily.      Historical Provider, MD  budesonide-formoterol (SYMBICORT) 80-4.5 MCG/ACT inhaler Inhale 2 puffs into the lungs 2 (two) times daily.      Historical Provider, MD  dexlansoprazole (DEXILANT) 60 MG capsule Take 60 mg by mouth daily.      Historical Provider, MD  doxepin (SINEQUAN) 25 MG capsule Take 25 mg by mouth at bedtime.      Historical Provider, MD  esomeprazole (NEXIUM) 40 MG capsule Take 40 mg by mouth at bedtime.      Historical Provider, MD  estradiol (CLIMARA) 0.1 mg/24hr Place 1 patch onto the skin once a week.      Historical Provider, MD  estrogen-methylTESTOSTERone (ESTRATEST) 1.25-2.5 MG per tablet Take 1 tablet by mouth daily.      Historical Provider, MD  gabapentin (NEURONTIN) 600 MG tablet Take 1,200 mg by mouth 2 (two) times daily.      Historical Provider, MD  guaifenesin (HUMIBID E) 400 MG TABS Take 400 mg by mouth 2 (two) times daily.      Historical  Provider, MD  insulin aspart (NOVOLOG) 100 UNIT/ML injection Inject into the skin.      Historical Provider, MD  levocetirizine (XYZAL) 5 MG tablet Take 5 mg by mouth daily.      Historical Provider, MD  lubiprostone (AMITIZA) 8 MCG capsule Take 8 mcg by mouth 2 (two) times daily with a meal.      Historical Provider, MD  metolazone (ZAROXOLYN) 2.5 MG tablet Take 2.5 mg by mouth 2 (two) times daily.      Historical Provider, MD  montelukast (SINGULAIR) 10 MG tablet Take 10 mg by mouth daily.      Historical Provider, MD  phentermine 37.5 MG capsule Take 37.5 mg by mouth every morning.    Historical Provider, MD  potassium chloride SA (K-DUR,KLOR-CON) 20 MEQ tablet Take 20 mEq by mouth 2 (two) times daily.      Historical  Provider, MD  rosuvastatin (CRESTOR) 10 MG tablet Take 10 mg by mouth daily.      Historical Provider, MD  topiramate (TOPAMAX) 25 MG capsule Take 25 mg by mouth 2 (two) times daily.    Historical Provider, MD   BP 123/74 mmHg  Pulse 89  Temp(Src) 98.2 F (36.8 C) (Oral)  Resp 18  Wt 166 lb (75.297 kg)  SpO2 95% Physical Exam  Constitutional: She appears well-developed and well-nourished.  HENT:  Head: Normocephalic and atraumatic.  Eyes: Conjunctivae are normal. Right eye exhibits no discharge. Left eye exhibits no discharge. No scleral icterus.  Cardiovascular: Normal rate, regular rhythm and normal heart sounds.   Pulmonary/Chest: Effort normal and breath sounds normal. No respiratory distress.  Neurological: She is alert. Coordination normal.  Skin: Skin is warm and dry. No rash noted. She is not diaphoretic. There is erythema.  Left index finger has a 1.5 cm laceration distal to PIP joint. Good ROM and is able to extend the finger with no limitation. There are two steri strips in place with good hemostasis. Cap refill is brisk distally.   Psychiatric: She has a normal mood and affect.  Nursing note and vitals reviewed.  ED Course  Procedures  DIAGNOSTIC STUDIES: Oxygen Saturation is 95% on RA, adequate by my interpretation.    COORDINATION OF CARE: 4:04 PM Discussed with pt that sutures are normally effective within 8 hours after a laceration and that draining of the laceration is normal.Discussed treatment plan to dress wound on her left index finger. Pt acknowledges and agrees to plan.   Labs Review Labs Reviewed - No data to display  Imaging Review No results found.   EKG Interpretation None      MDM   Final diagnoses:  None    Patient presents with a laceration to her right index finger that occurred yesterday evening. The laceration is to the dorsal aspect just distal to the PIP joint. She is able to flex and extend the finger without difficulty and I see  no evidence of a tendon injury. Capillary refill is brisk. She was seen at urgent care before coming here and had Steri-Strips placed. These appear to be approximating the laceration very well and I plan to leave these in place during the healing process. She will be placed in a dressing and advised to return as needed for any problems.  I personally performed the services described in this documentation, which was scribed in my presence. The recorded information has been reviewed and is accurate.      Geoffery Lyons, MD 07/16/14 2258

## 2014-07-21 DIAGNOSIS — F330 Major depressive disorder, recurrent, mild: Secondary | ICD-10-CM | POA: Insufficient documentation

## 2014-09-23 DIAGNOSIS — S93402A Sprain of unspecified ligament of left ankle, initial encounter: Secondary | ICD-10-CM | POA: Insufficient documentation

## 2014-11-24 ENCOUNTER — Ambulatory Visit: Payer: Self-pay | Admitting: Pediatrics

## 2014-12-08 ENCOUNTER — Ambulatory Visit: Payer: Self-pay | Admitting: Pediatrics

## 2014-12-27 ENCOUNTER — Encounter: Payer: Self-pay | Admitting: Pediatrics

## 2014-12-27 ENCOUNTER — Ambulatory Visit (INDEPENDENT_AMBULATORY_CARE_PROVIDER_SITE_OTHER): Payer: Medicare HMO | Admitting: Pediatrics

## 2014-12-27 VITALS — BP 100/58 | HR 70 | Temp 98.6°F | Resp 16 | Ht 63.0 in | Wt 165.0 lb

## 2014-12-27 DIAGNOSIS — J3089 Other allergic rhinitis: Secondary | ICD-10-CM | POA: Insufficient documentation

## 2014-12-27 DIAGNOSIS — B370 Candidal stomatitis: Secondary | ICD-10-CM | POA: Diagnosis not present

## 2014-12-27 DIAGNOSIS — J4530 Mild persistent asthma, uncomplicated: Secondary | ICD-10-CM | POA: Diagnosis not present

## 2014-12-27 DIAGNOSIS — E119 Type 2 diabetes mellitus without complications: Secondary | ICD-10-CM | POA: Insufficient documentation

## 2014-12-27 DIAGNOSIS — J309 Allergic rhinitis, unspecified: Secondary | ICD-10-CM | POA: Insufficient documentation

## 2014-12-27 MED ORDER — NYSTATIN 100000 UNIT/ML MT SUSP: OROMUCOSAL | Status: DC

## 2014-12-27 NOTE — Patient Instructions (Signed)
Continue on the treatment plan outlined above but stopped Xyzal 2 days before testing Mycostatin oral suspension-4 mL 4 times a day gargle and swallow-60 ML bottle Follow-up to do allergy skin testing

## 2014-12-27 NOTE — Progress Notes (Signed)
  [address] [place] [address] Dept: [phone]  FOLLOW UP NOTE  Patient ID: Carolyn Benson, female    DOB: Jul 04, 1955  Age: 59 y.o. MRN: [id number] Date of Office Visit: 12/27/2014  Assessment Chief Complaint: Follow-up and Allergies  HPI Carolyn Benson presents for follow-up of her asthma and allergic rhinitis. Her asthma has been well controlled. Over the past few months she had a severe sinus infection which was treated with antibiotics. With the use of antibiotics she developed some thrush. She was advised to reconsider going back on allergy injections. Her allergy injections were stopped in April of this year  Current medications are Xyzal 5 mg once a day, Singulair 10 mg once a day, Proventil 2 puffs every 4 hours if needed, fluticasone one or 2 sprays per nostril once a day if needed. Her other medications are outlined in the chart    Drug Allergies:  Allergies  Allergen Reactions  . Ibuprofen Swelling  . Ciprofloxacin Rash  . Clarithromycin Nausea And Vomiting  . Codeine Rash    Swelling  . Doxycycline Nausea And Vomiting  . Ibuprofen Swelling  . Metoclopramide Rash    PT DOES NOT KNOW  . Omnipaque [Iohexol] Other (See Comments)    Kidneys bleed Kidneys bleed  . Darvocet [Propoxyphene N-Acetaminophen] Nausea Only  . Other Rash    Fluoroquinolones  . Ranitidine Hcl Rash  . Stadol [Butorphanol Tartrate] Nausea Only  . Sulfa Antibiotics Rash    Physical Exam: BP 100/58 mmHg  Pulse 70  Temp(Src) 98.6 F (37 C) (Oral)  Resp 16  Ht 5\' 3"  (1.6 m)  Wt 165 lb (74.844 kg)  BMI 29.24 kg/m2   Physical Exam  Constitutional: She is oriented to person, place, and time. She appears well-developed and well-nourished.  HENT:  Eyes normal. Ears normal. Nose normal. Pharynx normal except for mild thrush  Neck: Neck supple.  Cardiovascular:  S1 and S2 normal no murmurs  Pulmonary/Chest:  Clear to percussion and auscultation  Lymphadenopathy:    She  has no cervical adenopathy.  Neurological: She is alert and oriented to person, place, and time.  Psychiatric: She has a normal mood and affect. Her behavior is normal. Judgment and thought content normal.  Vitals reviewed.   Diagnostics:  FVC 2.00 L FEV1 1.61 L predicted FVC 3.21 L predicted FEV1 2.49 L-this shows a moderate reduction in the forced vital capacity which is stable.  Assessment and Plan: 1. Mild persistent asthma, uncomplicated   2. Other allergic rhinitis   3. Thrush   4. Diabetes requiring an insulin pump 5.  hypertension  Meds ordered this encounter  Medications  . nystatin (MYCOSTATIN) 100000 UNIT/ML suspension    Sig: SWISH AND SPIT 4 ML BY MOUTH THREE TIMES A DAY AS DIRECTED.    Dispense:  60 mL    Refill:  0    Patient Instructions  Continue on the treatment plan outlined above but stopped Xyzal 2 days before testing Mycostatin oral suspension-4 mL 4 times a day gargle and swallow-60 ML bottle Follow-up to do allergy skin testing    Return in about 2 days (around 12/29/2014).    Thank you for the opportunity to care for this patient.  Please do not hesitate to contact me with questions.  Tonette BihariJ. A. Bardelas, M.D.  [facility] [address] [place], [place] [address] [phone]

## 2014-12-29 ENCOUNTER — Ambulatory Visit: Payer: Medicare HMO | Admitting: Pediatrics

## 2015-01-10 ENCOUNTER — Ambulatory Visit: Payer: Medicare HMO | Admitting: Pediatrics

## 2015-01-17 ENCOUNTER — Encounter: Payer: Self-pay | Admitting: Pediatrics

## 2015-01-17 ENCOUNTER — Ambulatory Visit (INDEPENDENT_AMBULATORY_CARE_PROVIDER_SITE_OTHER): Payer: Medicare HMO | Admitting: Pediatrics

## 2015-01-17 VITALS — BP 118/62 | HR 78 | Temp 98.1°F | Resp 20 | Ht 63.0 in | Wt 165.0 lb

## 2015-01-17 DIAGNOSIS — J4530 Mild persistent asthma, uncomplicated: Secondary | ICD-10-CM

## 2015-01-17 DIAGNOSIS — J309 Allergic rhinitis, unspecified: Secondary | ICD-10-CM

## 2015-01-17 DIAGNOSIS — K219 Gastro-esophageal reflux disease without esophagitis: Secondary | ICD-10-CM

## 2015-01-17 NOTE — Patient Instructions (Addendum)
Environmental control of dust and mold Nasal saline irrigations at night followed by fluticasone 2 sprays per nostril at night Continue on her current medications but she will call me she's not doing well on this treatment plan We will see in 6 weeks if she is needs to go back on allergy injections

## 2015-01-18 MED ORDER — FLUTICASONE PROPIONATE 50 MCG/ACT NA SUSP: NASAL | Status: DC

## 2015-01-18 NOTE — Progress Notes (Signed)
  [address] [place] [address] Dept: [phone]  FOLLOW UP NOTE  Patient ID: Carolyn HissVictoria B Teaney, female    DOB: [birth year]  Age: 59 y.o. MRN: [id number] Date of Office Visit: 01/17/2015  Assessment Chief Complaint: Allergy Testing  HPI Carolyn HissVictoria B Balin presents for allergy testing to determine if any to resume her allergy injections. Over the past few months she had a severe sinus infection. Her asthma has been well controlled.  Current medications are Xyzal 5 mg once a day, Singulair 10 mg once a day, Proventil 2 puffs every 4 hours if needed, fluticasone 2 sprays per nostril once a day. Her other medications are outlined in the chart   Drug Allergies:  Allergies  Allergen Reactions  . Ibuprofen Swelling  . Ciprofloxacin Rash  . Clarithromycin Nausea And Vomiting  . Codeine Rash    Swelling  . Doxycycline Nausea And Vomiting  . Ibuprofen Swelling  . Metoclopramide Rash    PT DOES NOT KNOW  . Omnipaque [Iohexol] Other (See Comments)    Kidneys bleed Kidneys bleed  . Darvocet [Propoxyphene N-Acetaminophen] Nausea Only  . Other Rash    Fluoroquinolones  . Ranitidine Hcl Rash  . Stadol [Butorphanol Tartrate] Nausea Only  . Sulfa Antibiotics Rash    Physical Exam: BP 118/62 mmHg  Pulse 78  Temp(Src) 98.1 F (36.7 C) (Oral)  Resp 20  Ht 5\' 3"  (1.6 m)  Wt 165 lb (74.844 kg)  BMI 29.24 kg/m2   Physical Exam  Constitutional: She is oriented to person, place, and time. She appears well-developed and well-nourished.  HENT:  Eyes normal. Ears normal. Nose mild swelling of nasal turbinates. Pharynx normal.  Neck: Neck supple.  Cardiovascular:  S1 and S2 normal no murmurs  Pulmonary/Chest:  Clear to percussion and auscultation  Lymphadenopathy:    She has no cervical adenopathy.  Neurological: She is alert and oriented to person, place, and time.  Psychiatric: She has a normal mood and affect. Her behavior is normal. Judgment and thought content normal.    Vitals reviewed.   Diagnostics:  Allergy skin test shows some reactivity to some molds on intradermal testing only  FVC 1.99 L FEV1 1.53 L. Predicted FVC 3.21 L predicted FEV1 2 L-this shows a moderate reduction in the FVC but stable for her  Assessment and Plan: 1. Allergic rhinitis, unspecified allergic rhinitis type   2. Mild persistent asthma, uncomplicated   3. Gastroesophageal reflux disease without esophagitis   4.      Diabetes requiring insulin pump  Meds ordered this encounter  Medications  . fluticasone (FLONASE) 50 MCG/ACT nasal spray    Sig: TWO SPRAYS EACH NOSTRIL ONCE A DAY FOR NASAL CONGESTION OR DRAINAGE.    Dispense:  16 g    Refill:  5    Patient Instructions  Environmental control of dust and mold Nasal saline irrigations at night followed by fluticasone 2 sprays per nostril at night Continue on her current medications but she will call me she's not doing well on this treatment plan We will see in 6 weeks if she is needs to go back on allergy injections    Return in about 6 weeks (around 02/28/2015).    Thank you for the opportunity to care for this patient.  Please do not hesitate to contact me with questions.  Tonette BihariJ. A. Bardelas, M.D.  [facility] [address] [place], [place] [address] [phone]

## 2015-02-22 DIAGNOSIS — J3089 Other allergic rhinitis: Secondary | ICD-10-CM | POA: Diagnosis not present

## 2015-03-07 ENCOUNTER — Ambulatory Visit: Payer: Medicare HMO

## 2015-03-07 ENCOUNTER — Ambulatory Visit (INDEPENDENT_AMBULATORY_CARE_PROVIDER_SITE_OTHER): Payer: Medicare HMO | Admitting: *Deleted

## 2015-03-07 DIAGNOSIS — J309 Allergic rhinitis, unspecified: Secondary | ICD-10-CM | POA: Diagnosis not present

## 2015-03-07 NOTE — Progress Notes (Signed)
Immunotherapy   Patient Details  Name: Carolyn Benson MRN: [id number] Date of Birth: 1955/09/16  03/07/2015  [name] here to pick up  Green 1:1000 Molds  Following schedule: A  Frequency:1 time per week Epi-Pen:Epi-Pen Avaiable  Consent signed and patient instructions given. Patient given 0.05cc today.    Chales Salmon 03/07/2015, 11:30 AM

## 2015-03-21 ENCOUNTER — Ambulatory Visit: Payer: Medicare HMO | Admitting: Allergy and Immunology

## 2015-03-21 ENCOUNTER — Ambulatory Visit: Payer: Medicare HMO | Admitting: Pediatrics

## 2015-04-27 DIAGNOSIS — E11319 Type 2 diabetes mellitus with unspecified diabetic retinopathy without macular edema: Secondary | ICD-10-CM | POA: Insufficient documentation

## 2015-04-27 DIAGNOSIS — E1142 Type 2 diabetes mellitus with diabetic polyneuropathy: Secondary | ICD-10-CM | POA: Insufficient documentation

## 2015-06-30 ENCOUNTER — Encounter: Payer: Self-pay | Admitting: Pediatrics

## 2015-06-30 ENCOUNTER — Telehealth: Payer: Self-pay | Admitting: *Deleted

## 2015-06-30 ENCOUNTER — Ambulatory Visit (INDEPENDENT_AMBULATORY_CARE_PROVIDER_SITE_OTHER): Payer: Medicare HMO | Admitting: Pediatrics

## 2015-06-30 VITALS — BP 122/64 | HR 79 | Temp 98.4°F | Resp 20

## 2015-06-30 DIAGNOSIS — Z79899 Other long term (current) drug therapy: Secondary | ICD-10-CM | POA: Diagnosis not present

## 2015-06-30 DIAGNOSIS — Z9641 Presence of insulin pump (external) (internal): Secondary | ICD-10-CM | POA: Insufficient documentation

## 2015-06-30 DIAGNOSIS — J309 Allergic rhinitis, unspecified: Secondary | ICD-10-CM | POA: Diagnosis not present

## 2015-06-30 DIAGNOSIS — J4531 Mild persistent asthma with (acute) exacerbation: Secondary | ICD-10-CM

## 2015-06-30 DIAGNOSIS — K219 Gastro-esophageal reflux disease without esophagitis: Secondary | ICD-10-CM

## 2015-06-30 DIAGNOSIS — J4541 Moderate persistent asthma with (acute) exacerbation: Secondary | ICD-10-CM | POA: Insufficient documentation

## 2015-06-30 MED ORDER — ALBUTEROL SULFATE (2.5 MG/3ML) 0.083% IN NEBU: 2.5000 mg | INHALATION_SOLUTION | RESPIRATORY_TRACT | Status: DC | PRN

## 2015-06-30 MED ORDER — FLUTICASONE PROPIONATE 50 MCG/ACT NA SUSP: 1.0000 | Freq: Two times a day (BID) | NASAL | Status: DC

## 2015-06-30 MED ORDER — BUDESONIDE-FORMOTEROL FUMARATE 80-4.5 MCG/ACT IN AERO: 2.0000 | INHALATION_SPRAY | Freq: Two times a day (BID) | RESPIRATORY_TRACT | Status: DC

## 2015-06-30 MED ORDER — ALBUTEROL SULFATE HFA 108 (90 BASE) MCG/ACT IN AERS: 2.0000 | INHALATION_SPRAY | RESPIRATORY_TRACT | Status: DC | PRN

## 2015-06-30 NOTE — Progress Notes (Signed)
[address] [place] [address] Dept: [phone]  FOLLOW UP NOTE  Patient ID: Carolyn Benson, female    DOB: 1955/12/14  Age: 60 y.o. MRN: [id number] Date of Office Visit: 06/30/2015  Assessment Chief Complaint: Wheezing and Cough  HPI Carolyn Benson presents for evaluation of coughing and wheezing and shortness of breath. She saw her primary care physician on May 13 of this year and was given a steroid injection and an antibiotic. The steroid  injection raised her blood sugars. She has diabetes. She was given a Zithromax pack with one refill. She has not refilled Zithromax pack. She was also started on Advair Diskus 100-50 with one puff every 6 hours . She has had mild nasal congestion and a discolored mucus.. She is not using Symbicort any longer. She is on allergy injections every 3 weeks and has not had any reactions.  Current medications .Marland Kitchen She is on fluticasone 2 sprays per nostril once a day, Advair as noted, montelukast  10 mg once a day, levocetirizine 5 mg once a day, insulin pump,  esomeprazole 40 mg once a day. Her other medications are outlined in the chart   Drug Allergies:  Allergies  Allergen Reactions  . Ibuprofen Swelling  . Ciprofloxacin Rash  . Clarithromycin Nausea And Vomiting  . Codeine Rash    Swelling  . Doxycycline Nausea And Vomiting  . Ibuprofen Swelling  . Metoclopramide Rash    PT DOES NOT KNOW  . Omnipaque [Iohexol] Other (See Comments)    Kidneys bleed Kidneys bleed  . Darvocet [Propoxyphene N-Acetaminophen] Nausea Only  . Other Rash    Fluoroquinolones  . Ranitidine Hcl Rash  . Stadol [Butorphanol Tartrate] Nausea Only  . Sulfa Antibiotics Rash    Physical Exam: BP 122/64 mmHg  Pulse 79  Temp(Src) 98.4 F (36.9 C) (Oral)  Resp 20   Physical Exam  Constitutional: She is oriented to person, place, and time. She appears well-developed and well-nourished.  HENT:  Eyes normal. Ears normal. Nose mild swelling of nasal  turbinates. Pharynx normal.  Neck: Neck supple.  Cardiovascular:  S1 and S2 normal no murmurs  Pulmonary/Chest:  Clear to percussion auscultation  Lymphadenopathy:    She has no cervical adenopathy.  Neurological: She is alert and oriented to person, place, and time.  Psychiatric: She has a normal mood and affect. Her behavior is normal. Thought content normal.  Vitals reviewed.   Diagnostics:  FVC 1.74 L FEV1 1.61 L. Predicted FVC 3.18 L predicted FEV1 2.46 L. After albuterol by nebulization FVC 1.65 L FEV1 1.49 L-This shows a moderate reduction in the forced vital capacity with no significant improvement after albuterol but her breath sounds were better. He'll pulse ox was 96%  Assessment and Plan: 1. Allergic rhinitis, unspecified allergic rhinitis type   2. Asthma with acute exacerbation, mild persistent   3. Gastroesophageal reflux disease without esophagitis   4. Insulin pump in place   5. Current use of beta blocker     Meds ordered this encounter  Medications  . albuterol (PROVENTIL) (2.5 MG/3ML) 0.083% nebulizer solution    Sig: Take 3 mLs (2.5 mg total) by nebulization every 4 (four) hours as needed for wheezing or shortness of breath.    Dispense:  75 mL    Refill:  3  . albuterol (PROVENTIL HFA;VENTOLIN HFA) 108 (90 Base) MCG/ACT inhaler    Sig: Inhale 2 puffs into the lungs every 4 (four) hours as needed for wheezing or shortness of  breath.    Dispense:  1 Inhaler    Refill:  3  . budesonide-formoterol (SYMBICORT) 80-4.5 MCG/ACT inhaler    Sig: Inhale 2 puffs into the lungs 2 (two) times daily. Reported on 06/30/2015    Dispense:  1 Inhaler    Refill:  5  . fluticasone (FLONASE) 50 MCG/ACT nasal spray    Sig: Place 1 spray into both nostrils 2 (two) times daily. TWO SPRAYS EACH NOSTRIL ONCE A DAY FOR NASAL CONGESTION OR DRAINAGE.    Dispense:  16 g    Refill:  5    Patient Instructions  Albuterol 0.083% one unit dose every 4 hours if needed for coughing or  wheezing Proventil 2 puffs every 4 hours if needed for wheezing or coughing spells Montelukast 10 mg once a day for coughing or wheezing Symbicort 80-2 puffs twice a day to prevent coughing or wheezing Fluticasone 1 spray per nostril twice a day if needed for stuffy nose Levocetirizine 5 mg once a day for runny nose No allergy injections for 2 weeks. See me then to see if you can go back to her allergy injections. May need to update her allergy testing Add prednisone 10 mg twice a day for 4 days, 10 mg on the fifth day Refill  Zithromax pack Stop Advair Continue on your other medications    Return in about 2 weeks (around 07/14/2015).    Thank you for the opportunity to care for this patient.  Please do not hesitate to contact me with questions.  Tonette BihariJ. A. Shaheen Mende, M.D.  [facility]  [address] [place], [place] [address] [phone]

## 2015-06-30 NOTE — Patient Instructions (Addendum)
Albuterol 0.083% one unit dose every 4 hours if needed for coughing or wheezing Proventil 2 puffs every 4 hours if needed for wheezing or coughing spells Montelukast 10 mg once a day for coughing or wheezing Symbicort 80-2 puffs twice a day to prevent coughing or wheezing Fluticasone 1 spray per nostril twice a day if needed for stuffy nose Levocetirizine 5 mg once a day for runny nose No allergy injections for 2 weeks. See me then to see if you can go back to her allergy injections. May need to update her allergy testing Add prednisone 10 mg twice a day for 4 days, 10 mg on the fifth day Refill  Zithromax pack Stop Advair Continue on your other medications

## 2015-06-30 NOTE — Telephone Encounter (Signed)
Left message, pt needs to make a follow up appt with dr Carolyn Benson for 2 weeks and no allergy shots until then.

## 2015-07-06 DIAGNOSIS — J3089 Other allergic rhinitis: Secondary | ICD-10-CM

## 2015-07-18 ENCOUNTER — Encounter: Payer: Self-pay | Admitting: Pediatrics

## 2015-07-18 ENCOUNTER — Ambulatory Visit (INDEPENDENT_AMBULATORY_CARE_PROVIDER_SITE_OTHER): Payer: Medicare HMO | Admitting: Pediatrics

## 2015-07-18 ENCOUNTER — Ambulatory Visit: Payer: Medicare HMO

## 2015-07-18 ENCOUNTER — Other Ambulatory Visit: Payer: Self-pay

## 2015-07-18 VITALS — BP 122/70 | HR 85 | Temp 98.4°F | Resp 20

## 2015-07-18 DIAGNOSIS — Z79899 Other long term (current) drug therapy: Secondary | ICD-10-CM | POA: Diagnosis not present

## 2015-07-18 DIAGNOSIS — J309 Allergic rhinitis, unspecified: Secondary | ICD-10-CM

## 2015-07-18 DIAGNOSIS — J4540 Moderate persistent asthma, uncomplicated: Secondary | ICD-10-CM

## 2015-07-18 DIAGNOSIS — K219 Gastro-esophageal reflux disease without esophagitis: Secondary | ICD-10-CM

## 2015-07-18 DIAGNOSIS — Z4681 Encounter for fitting and adjustment of insulin pump: Secondary | ICD-10-CM

## 2015-07-18 MED ORDER — EPINEPHRINE 0.3 MG/0.3ML IJ SOAJ: 0.3000 mg | Freq: Once | INTRAMUSCULAR | Status: DC

## 2015-07-18 MED ORDER — ALBUTEROL SULFATE HFA 108 (90 BASE) MCG/ACT IN AERS: 2.0000 | INHALATION_SPRAY | RESPIRATORY_TRACT | Status: DC | PRN

## 2015-07-18 NOTE — Patient Instructions (Addendum)
Continue on her current medications She may resume her allergy injections Call me if  she's not doing well on this treatment plan.

## 2015-07-18 NOTE — Progress Notes (Signed)
[address] [place] [address] Dept: [phone]  FOLLOW UP NOTE  Patient ID: Carolyn HissVictoria B Benson, female    DOB: March 25, 1955  Age: 60 y.o. MRN: [id number] Date of Office Visit: 07/18/2015  Assessment Chief Complaint: Follow-up  HPI Carolyn HissVictoria B Benson presents for follow-up of asthma and allergic rhinitis. Her asthma has improved significantly since her last visit. She is doing better with Symbicort 80-4.5-2 puffs twice a day. Her nasal symptoms are under control. She has never had any adverse reactions to her allergy injections.  Current medications albuterol 0.083% one unit dose every 4 hours if needed, Ventolin 2 puffs every 4 hours if needed, Symbicort 80-4.5 -2 puffs twice a day, fluticasone 1 spray per nostril twice a day, montelukast 10 mg once a day and levocetirizine 5 mg once a day.   Drug Allergies:  Allergies  Allergen Reactions  . Ibuprofen Swelling  . Ciprofloxacin Rash  . Clarithromycin Nausea And Vomiting  . Codeine Rash    Swelling  . Doxycycline Nausea And Vomiting  . Ibuprofen Swelling  . Metoclopramide Rash    PT DOES NOT KNOW  . Omnipaque [Iohexol] Other (See Comments)    Kidneys bleed Kidneys bleed  . Darvocet [Propoxyphene N-Acetaminophen] Nausea Only  . Other Rash    Fluoroquinolones  . Ranitidine Hcl Rash  . Stadol [Butorphanol Tartrate] Nausea Only  . Sulfa Antibiotics Rash    Physical Exam: BP 122/70 mmHg  Pulse 85  Temp(Src) 98.4 F (36.9 C) (Oral)  Resp 20  SpO2 94%   Physical Exam  Constitutional: She appears well-developed and well-nourished.  HENT:  Eyes normal. Ears normal. Nose normal. Pharynx normal.  Neck: Neck supple.  Cardiovascular:  S1 and S2 normal no murmurs  Pulmonary/Chest:  Clear to percussion and auscultation  Lymphadenopathy:    She has no cervical adenopathy.  Neurological: She is alert.  Skin:  Clear  Psychiatric: She has a normal mood and affect. Her behavior is normal. Judgment and thought  content normal.  Vitals reviewed.   Diagnostics:   FVC 1.87 L FEV1 1.61 L. Predicted FVC 3.18 L predicted FEV1 2.46 L-this shows a moderate reduction in the forced vital capacity Assessment and Plan: 1. Allergic rhinitis, unspecified allergic rhinitis type   2. Moderate persistent asthma, uncomplicated   3. Gastroesophageal reflux disease without esophagitis   4. Insulin pump fitting or adjustment   5. Current use of beta blocker     Meds ordered this encounter  Medications  . albuterol (PROVENTIL HFA;VENTOLIN HFA) 108 (90 Base) MCG/ACT inhaler    Sig: Inhale 2 puffs into the lungs every 4 (four) hours as needed for wheezing or shortness of breath.    Dispense:  1 Inhaler    Refill:  3    PATIENT WILL CALL  . EPINEPHrine (EPIPEN 2-PAK) 0.3 mg/0.3 mL IJ SOAJ injection    Sig: Inject 0.3 mLs (0.3 mg total) into the muscle once.    Dispense:  2 Device    Refill:  1    Patient Instructions  Continue on her current medications She may resume her allergy injections Call me if  she's not doing well on this treatment plan.    Return in about 6 months (around 01/17/2016).    Thank you for the opportunity to care for this patient.  Please do not hesitate to contact me with questions.  Tonette BihariJ. A. Bardelas, M.D.  [facility] [address] [place], [place] [phone]

## 2015-07-24 ENCOUNTER — Ambulatory Visit (INDEPENDENT_AMBULATORY_CARE_PROVIDER_SITE_OTHER): Payer: Medicare HMO

## 2015-07-24 DIAGNOSIS — J309 Allergic rhinitis, unspecified: Secondary | ICD-10-CM

## 2015-08-02 ENCOUNTER — Ambulatory Visit (INDEPENDENT_AMBULATORY_CARE_PROVIDER_SITE_OTHER): Payer: Medicare HMO

## 2015-08-02 DIAGNOSIS — J309 Allergic rhinitis, unspecified: Secondary | ICD-10-CM

## 2015-08-10 ENCOUNTER — Ambulatory Visit (INDEPENDENT_AMBULATORY_CARE_PROVIDER_SITE_OTHER): Payer: Medicare HMO | Admitting: *Deleted

## 2015-08-10 DIAGNOSIS — J309 Allergic rhinitis, unspecified: Secondary | ICD-10-CM

## 2015-08-16 ENCOUNTER — Ambulatory Visit (INDEPENDENT_AMBULATORY_CARE_PROVIDER_SITE_OTHER): Payer: Medicare HMO

## 2015-08-16 DIAGNOSIS — J309 Allergic rhinitis, unspecified: Secondary | ICD-10-CM

## 2015-09-05 ENCOUNTER — Ambulatory Visit (INDEPENDENT_AMBULATORY_CARE_PROVIDER_SITE_OTHER): Payer: Medicare HMO | Admitting: *Deleted

## 2015-09-05 ENCOUNTER — Telehealth: Payer: Self-pay | Admitting: Allergy and Immunology

## 2015-09-05 DIAGNOSIS — J309 Allergic rhinitis, unspecified: Secondary | ICD-10-CM | POA: Diagnosis not present

## 2015-09-05 NOTE — Telephone Encounter (Signed)
Patient came in for 4th injection and has concern that there is no charges yet. Please check and call her with outcome. Thanks!

## 2015-09-05 NOTE — Telephone Encounter (Signed)
TOLD PT SHE HAD A 0 BAL - INS IS PAYING FOR INJS

## 2015-10-02 ENCOUNTER — Ambulatory Visit (INDEPENDENT_AMBULATORY_CARE_PROVIDER_SITE_OTHER): Payer: Medicare HMO

## 2015-10-02 DIAGNOSIS — J309 Allergic rhinitis, unspecified: Secondary | ICD-10-CM | POA: Diagnosis not present

## 2015-11-07 ENCOUNTER — Ambulatory Visit (INDEPENDENT_AMBULATORY_CARE_PROVIDER_SITE_OTHER): Payer: Medicare HMO

## 2015-11-07 DIAGNOSIS — J309 Allergic rhinitis, unspecified: Secondary | ICD-10-CM

## 2015-11-27 ENCOUNTER — Ambulatory Visit (INDEPENDENT_AMBULATORY_CARE_PROVIDER_SITE_OTHER): Payer: Medicare HMO

## 2015-11-27 DIAGNOSIS — J309 Allergic rhinitis, unspecified: Secondary | ICD-10-CM

## 2015-12-21 ENCOUNTER — Ambulatory Visit (INDEPENDENT_AMBULATORY_CARE_PROVIDER_SITE_OTHER): Payer: Medicare HMO | Admitting: *Deleted

## 2015-12-21 DIAGNOSIS — J309 Allergic rhinitis, unspecified: Secondary | ICD-10-CM

## 2016-01-18 ENCOUNTER — Ambulatory Visit (INDEPENDENT_AMBULATORY_CARE_PROVIDER_SITE_OTHER): Payer: Medicare HMO | Admitting: Allergy and Immunology

## 2016-01-18 ENCOUNTER — Encounter: Payer: Self-pay | Admitting: Allergy and Immunology

## 2016-01-18 VITALS — BP 108/74 | HR 68 | Temp 98.0°F | Resp 16

## 2016-01-18 DIAGNOSIS — J3089 Other allergic rhinitis: Secondary | ICD-10-CM

## 2016-01-18 DIAGNOSIS — J4540 Moderate persistent asthma, uncomplicated: Secondary | ICD-10-CM

## 2016-01-18 NOTE — Progress Notes (Signed)
Follow-up Note  RE: Carolyn Benson MRN: [id number] DOB: 08/21/1955 Date of Office Visit: 01/18/2016  Primary care provider: Pola CornJONES, AUTUMN SHARIE, PA-C Referring provider: Pola CornJones, Autumn Sharie, P*  History of present illness: Carolyn Benson is a 60 y.o. female with persistent asthma and allergic rhinitis presenting today for follow up.  She was last seen in this clinic on 07/18/2015.  She reports that in the interval since her previous visit her upper and lower respiration symptoms have been well-controlled.  Over the past several months she has not required asthma rescue medication, experienced nocturnal awakenings due to lower respiratory symptoms, nor have activities of daily living been limited.  She currently takes Symbicort 160/4.5 g, 2 inhalations via spacer device twice a day.  She states that she has noted significant symptom relief with aeroallergen immunotherapy.  She is receiving allergy injections without problems or complications.  She has no nasal symptom complaints today.   Assessment and plan: Moderate persistent asthma Well-controlled and stable.  Continue Symbicort 160/4.5 g, 2 inhalations via spacer device twice a day, and albuterol HFA, 1-2 ablations every 4-6 hours as needed.  Subjective and objective measures of pulmonary function will be followed and the treatment plan will be adjusted accordingly.  Other allergic rhinitis Stable.  Continue appropriate allergen avoidance measures, aeroallergen immunotherapy as prescribed and as tolerated, and fluticasone nasal spray as needed.   Diagnostics: Spirometry reveals FVC of 1.2 L and an FEV1 of 1.50 L.  Moderate restrictive pattern consistent with previous studies.  Please see scanned spirometry results for details.    Physical examination: Blood pressure 108/74, pulse 68, temperature 98 F (36.7 C), temperature source Oral, resp. rate 16.  General: Alert, interactive, in no acute distress. HEENT: TMs  pearly gray, turbinates mildly edematous without discharge, post-pharynx unremarkable. Neck: Supple without lymphadenopathy. Lungs: Mildly decreased breath sounds bilaterally without wheezing, rhonchi or rales. CV: Normal S1, S2 without murmurs. Skin: Warm and dry, without lesions or rashes.  The following portions of the patient's history were reviewed and updated as appropriate: allergies, current medications, past family history, past medical history, past social history, past surgical history and problem list.    Medication List       Accurate as of 01/18/16  1:35 PM. Always use your most recent med list.          acyclovir 400 MG tablet Commonly known as:  ZOVIRAX Take 400 mg by mouth 2 (two) times daily.   albuterol (2.5 MG/3ML) 0.083% nebulizer solution Commonly known as:  PROVENTIL Take 3 mLs (2.5 mg total) by nebulization every 4 (four) hours as needed for wheezing or shortness of breath.   albuterol 108 (90 Base) MCG/ACT inhaler Commonly known as:  PROVENTIL HFA;VENTOLIN HFA Inhale 2 puffs into the lungs every 4 (four) hours as needed for wheezing or shortness of breath.   atenolol 25 MG tablet Commonly known as:  TENORMIN Take 25 mg by mouth daily.   BELVIQ 10 MG Tabs Generic drug:  Lorcaserin HCl TAKE 1 TABLET OP TWICE A DAY   benzonatate 100 MG capsule Commonly known as:  TESSALON Take 100 mg by mouth every 6 (six) hours as needed for cough.   budesonide-formoterol 80-4.5 MCG/ACT inhaler Commonly known as:  SYMBICORT Inhale 2 puffs into the lungs 2 (two) times daily. Reported on 06/30/2015   CRESTOR 10 MG tablet Generic drug:  rosuvastatin Take 10 mg by mouth.   DEXILANT 60 MG capsule Generic drug:  dexlansoprazole Take 60 mg by  mouth daily.   Diethylpropion HCl CR 75 MG Tb24 Take by mouth.   doxepin 25 MG capsule Commonly known as:  SINEQUAN Take 25 mg by mouth at bedtime.   EPINEPHrine 0.3 mg/0.3 mL Soaj injection Commonly known as:  EPIPEN  2-PAK Inject 0.3 mLs (0.3 mg total) into the muscle once.   escitalopram 10 MG tablet Commonly known as:  LEXAPRO Take 1 a day   esomeprazole 40 MG capsule Commonly known as:  NEXIUM Take 40 mg by mouth at bedtime.   estrogens-methylTEST 1.25-2.5 MG tablet Commonly known as:  ESTRATEST TAKE 1 TABLET BY MOUTH EVERY DAY   fluconazole 150 MG tablet Commonly known as:  DIFLUCAN TAKE 1 TABLET EACH WEEK FOR YEAST INFECTION   fluticasone 50 MCG/ACT nasal spray Commonly known as:  FLONASE INSTILL 2 SPRAYS IN EACH NOSTRIL EVERY DAY   gabapentin 600 MG tablet Commonly known as:  NEURONTIN Take 1,200 mg by mouth 2 (two) times daily.   guaifenesin 400 MG Tabs tablet Commonly known as:  HUMIBID E Take 400 mg by mouth 2 (two) times daily.   HUMALOG 100 UNIT/ML cartridge Generic drug:  insulin lispro Inject into the skin 3 (three) times daily with meals.   Insulin Infusion Pump Devi 80 Units by Insulin Pump route once a week.   levocetirizine 5 MG tablet Commonly known as:  XYZAL   levothyroxine 50 MCG tablet Commonly known as:  SYNTHROID, LEVOTHROID   linaclotide 290 MCG Caps capsule Commonly known as:  LINZESS Take by mouth.   LORazepam 0.5 MG tablet Commonly known as:  ATIVAN Take 1 or 2 at night as needed   lubiprostone 8 MCG capsule Commonly known as:  AMITIZA Take 8 mcg by mouth 2 (two) times daily with a meal.   metolazone 2.5 MG tablet Commonly known as:  ZAROXOLYN Take 2.5 mg by mouth 2 (two) times daily.   montelukast 10 MG tablet Commonly known as:  SINGULAIR Take 10 mg by mouth daily.   nystatin 100000 UNIT/ML suspension Commonly known as:  MYCOSTATIN SWISH AND SPIT 4 ML BY MOUTH THREE TIMES A DAY AS DIRECTED.   ONE TOUCH ULTRA TEST test strip Generic drug:  glucose blood   phentermine 37.5 MG capsule Take 37.5 mg by mouth every morning.   potassium chloride 20 MEQ packet Commonly known as:  KLOR-CON Take by mouth.   sertraline 50 MG  tablet Commonly known as:  ZOLOFT Take 50 mg by mouth.   terbinafine 250 MG tablet Commonly known as:  LAMISIL   topiramate 25 MG tablet Commonly known as:  TOPAMAX       Allergies  Allergen Reactions  . Ibuprofen Swelling  . Ciprofloxacin Rash  . Clarithromycin Nausea And Vomiting  . Codeine Rash    Swelling  . Doxycycline Nausea And Vomiting  . Ibuprofen Swelling  . Metoclopramide Rash    PT DOES NOT KNOW  . Omnipaque [Iohexol] Other (See Comments)    Kidneys bleed Kidneys bleed  . Sulfasalazine Rash  . Phenylpropanol Nausea And Vomiting  . Butorphanol Tartrate Nausea And Vomiting and Nausea Only  . Darvocet [Propoxyphene N-Acetaminophen] Nausea Only  . Other Rash    Fluoroquinolones  . Ranitidine Hcl Rash  . Stadol [Butorphanol Tartrate] Nausea Only  . Sulfa Antibiotics Rash    I appreciate the opportunity to take part in Carolyn Benson's care. Please do not hesitate to contact me with questions.  Sincerely,   R. Jorene Guestarter Kenndra Morris, MD

## 2016-01-18 NOTE — Patient Instructions (Addendum)
Moderate persistent asthma Well-controlled and stable.  Continue Symbicort 160/4.5 g, 2 inhalations via spacer device twice a day, and albuterol HFA, 1-2 ablations every 4-6 hours as needed.  Subjective and objective measures of pulmonary function will be followed and the treatment plan will be adjusted accordingly.  Other allergic rhinitis Stable.  Continue appropriate allergen avoidance measures, aeroallergen immunotherapy as prescribed and as tolerated, and fluticasone nasal spray as needed.   Return in about 4 months (around 05/18/2016), or if symptoms worsen or fail to improve.

## 2016-01-18 NOTE — Assessment & Plan Note (Signed)
Stable.  Continue appropriate allergen avoidance measures, aeroallergen immunotherapy as prescribed and as tolerated, and fluticasone nasal spray as needed. 

## 2016-01-18 NOTE — Assessment & Plan Note (Signed)
Well-controlled and stable.  Continue Symbicort 160/4.5 g, 2 inhalations via spacer device twice a day, and albuterol HFA, 1-2 ablations every 4-6 hours as needed.  Subjective and objective measures of pulmonary function will be followed and the treatment plan will be adjusted accordingly.

## 2016-01-31 ENCOUNTER — Ambulatory Visit (INDEPENDENT_AMBULATORY_CARE_PROVIDER_SITE_OTHER): Payer: Medicare HMO | Admitting: *Deleted

## 2016-01-31 DIAGNOSIS — J309 Allergic rhinitis, unspecified: Secondary | ICD-10-CM

## 2016-02-15 ENCOUNTER — Ambulatory Visit (INDEPENDENT_AMBULATORY_CARE_PROVIDER_SITE_OTHER): Payer: Medicare HMO

## 2016-02-15 DIAGNOSIS — J309 Allergic rhinitis, unspecified: Secondary | ICD-10-CM

## 2016-02-26 ENCOUNTER — Other Ambulatory Visit: Payer: Self-pay | Admitting: Physical Medicine and Rehabilitation

## 2016-02-26 DIAGNOSIS — M5416 Radiculopathy, lumbar region: Secondary | ICD-10-CM

## 2016-03-01 NOTE — Addendum Note (Signed)
Addended by: Berna BueWHITAKER, CARRIE L on: 03/01/2016 03:06 PM   Modules accepted: Orders

## 2016-03-03 ENCOUNTER — Ambulatory Visit
Admission: RE | Admit: 2016-03-03 | Discharge: 2016-03-03 | Disposition: A | Discharge status: Home / Self Care | Payer: Managed Care, Other (non HMO) | Source: Ambulatory Visit | Attending: Physical Medicine and Rehabilitation | Admitting: Physical Medicine and Rehabilitation

## 2016-03-03 DIAGNOSIS — M5416 Radiculopathy, lumbar region: Secondary | ICD-10-CM

## 2016-03-03 MED ORDER — GADOBENATE DIMEGLUMINE 529 MG/ML IV SOLN
17.0000 mL | Freq: Once | INTRAVENOUS | Status: AC | PRN
  Administered 2016-03-03: 17 mL via INTRAVENOUS

## 2016-03-05 ENCOUNTER — Ambulatory Visit (INDEPENDENT_AMBULATORY_CARE_PROVIDER_SITE_OTHER): Payer: Medicare HMO

## 2016-03-05 DIAGNOSIS — J309 Allergic rhinitis, unspecified: Secondary | ICD-10-CM | POA: Diagnosis not present

## 2016-03-21 ENCOUNTER — Ambulatory Visit (INDEPENDENT_AMBULATORY_CARE_PROVIDER_SITE_OTHER): Payer: Medicare HMO

## 2016-03-21 DIAGNOSIS — J309 Allergic rhinitis, unspecified: Secondary | ICD-10-CM | POA: Diagnosis not present

## 2016-04-03 ENCOUNTER — Ambulatory Visit (INDEPENDENT_AMBULATORY_CARE_PROVIDER_SITE_OTHER): Payer: Medicare HMO | Admitting: *Deleted

## 2016-04-03 DIAGNOSIS — J309 Allergic rhinitis, unspecified: Secondary | ICD-10-CM | POA: Diagnosis not present

## 2016-04-18 ENCOUNTER — Ambulatory Visit (INDEPENDENT_AMBULATORY_CARE_PROVIDER_SITE_OTHER): Payer: Medicare HMO | Admitting: *Deleted

## 2016-04-18 DIAGNOSIS — J309 Allergic rhinitis, unspecified: Secondary | ICD-10-CM | POA: Diagnosis not present

## 2016-05-09 ENCOUNTER — Ambulatory Visit (INDEPENDENT_AMBULATORY_CARE_PROVIDER_SITE_OTHER): Payer: Medicare HMO

## 2016-05-09 DIAGNOSIS — J309 Allergic rhinitis, unspecified: Secondary | ICD-10-CM

## 2016-05-22 ENCOUNTER — Encounter: Payer: Self-pay | Admitting: Allergy and Immunology

## 2016-05-22 ENCOUNTER — Ambulatory Visit (INDEPENDENT_AMBULATORY_CARE_PROVIDER_SITE_OTHER): Payer: Medicare HMO | Admitting: Allergy and Immunology

## 2016-05-22 VITALS — BP 118/64 | HR 88 | Resp 16 | Ht 62.5 in | Wt 195.0 lb

## 2016-05-22 DIAGNOSIS — J309 Allergic rhinitis, unspecified: Secondary | ICD-10-CM | POA: Diagnosis not present

## 2016-05-22 DIAGNOSIS — J45901 Unspecified asthma with (acute) exacerbation: Secondary | ICD-10-CM | POA: Diagnosis not present

## 2016-05-22 DIAGNOSIS — J0100 Acute maxillary sinusitis, unspecified: Secondary | ICD-10-CM

## 2016-05-22 DIAGNOSIS — J3089 Other allergic rhinitis: Secondary | ICD-10-CM

## 2016-05-22 DIAGNOSIS — J019 Acute sinusitis, unspecified: Secondary | ICD-10-CM | POA: Insufficient documentation

## 2016-05-22 DIAGNOSIS — E0800 Diabetes mellitus due to underlying condition with hyperosmolarity without nonketotic hyperglycemic-hyperosmolar coma (NKHHC): Secondary | ICD-10-CM

## 2016-05-22 MED ORDER — PREDNISONE 1 MG PO TABS: 10.0000 mg | ORAL_TABLET | Freq: Every day | ORAL | Status: DC

## 2016-05-22 NOTE — Assessment & Plan Note (Addendum)
   Prednisone has been provided, 20 mg x 4 days, 10 mg x1 day, then stop.  I recommended using Symbicort 80-4.5 g, 2 inhalations via spacer device twice a day, on a scheduled basis rather than as needed basis.  Continue montelukast 10 mg daily bedtime and albuterol HFA, 1-2 inhalations every 4-6 hours as needed.  The patient has been asked to contact me if her symptoms persist or progress. Otherwise, she may return for follow up in 4 months.

## 2016-05-22 NOTE — Assessment & Plan Note (Signed)
   Continue appropriate allergen avoidance measures and fluticasone nasal spray as needed.  Resume aeroallergen immunotherapy when the current asthma exacerbation and sinus infection have resolved.

## 2016-05-22 NOTE — Assessment & Plan Note (Signed)
   Prednisone has been provided (as above).  Continue fluticasone nasal spray, for now she should take this medication twice daily until the sinus infection has resolved.  Nasal saline lavage (NeilMed) has been recommended as needed and prior to medicated nasal sprays along with instructions for proper administration.  For thick post nasal drainage, add guaifenesin 1200 mg (Mucinex Maximum Strength)  twice daily as needed with adequate hydration as discussed.  The patient has been asked to contact me if her symptoms persist, progress, or if she becomes febrile.

## 2016-05-22 NOTE — Progress Notes (Signed)
Follow-up Note  RE: Carolyn Benson MRN: [id number] DOB: 1955-12-22 Date of Office Visit: 05/22/2016  Primary care provider: Pola Corn, PA-C Referring provider: Pola Corn, P*  History of present illness: Carolyn Benson is a 61 y.o. female with persistent asthma and allergic rhinitis presenting today for sick visit.  She was last seen in this clinic in December 2017.  She reports that over the past one or 2 weeks she has been experiencing more frequent asthma symptoms.  She had been "asthma attack" approximately one or 2 weeks ago and again today.  She currently uses Symbicort as needed rather than on a scheduled basis and albuterol as needed.  She takes montelukast 10 mg daily.  She also complains of nasal congestion, sinus pressure, rhinorrhea, postnasal drainage, sore throat, and sneezing.  She has been experiencing these nasal/sinus symptoms over the past few days.  She denies fevers, chills, or foul mucus production.   Assessment and plan: Asthma with acute exacerbation  Prednisone has been provided, 20 mg x 4 days, 10 mg x1 day, then stop.  I recommended using Symbicort 80-4.5 g, 2 inhalations via spacer device twice a day, on a scheduled basis rather than as needed basis.  Continue montelukast 10 mg daily bedtime and albuterol HFA, 1-2 inhalations every 4-6 hours as needed.  The patient has been asked to contact me if her symptoms persist or progress. Otherwise, she may return for follow up in 4 months.  Acute sinusitis  Prednisone has been provided (as above).  Continue fluticasone nasal spray, for now she should take this medication twice daily until the sinus infection has resolved.  Nasal saline lavage (NeilMed) has been recommended as needed and prior to medicated nasal sprays along with instructions for proper administration.  For thick post nasal drainage, add guaifenesin 1200 mg (Mucinex Maximum Strength)  twice daily as needed with  adequate hydration as discussed.  The patient has been asked to contact me if her symptoms persist, progress, or if she becomes febrile.  Other allergic rhinitis  Continue appropriate allergen avoidance measures and fluticasone nasal spray as needed.  Resume aeroallergen immunotherapy when the current asthma exacerbation and sinus infection have resolved.  Diabetes mellitus (HCC)  The patient has been asked to carefully monitor blood glucose levels while on prednisone.  She/He has verbalized understanding and has agreed to do so.   Meds ordered this encounter  Medications  . predniSONE (DELTASONE) tablet 10 mg    Diagnostics: Spirometry reveals an FVC of 1.99 L and an FEV1 of 1.68 L (71% predicted) without postbronchodilator improvement.  Please see scanned spirometry results for details.    Physical examination: Blood pressure 118/64, pulse 88, resp. rate 16, height 5' 2.5" (1.588 m), weight 195 lb (88.5 kg), SpO2 96 %.  General: Alert, interactive, in no acute distress. HEENT: TMs pearly gray, turbinates moderately edematous with thick discharge, post-pharynx moderately erythematous. Neck: Supple without lymphadenopathy. Lungs: Mildly decreased breath sounds bilaterally without wheezing, rhonchi or rales. CV: Normal S1, S2 without murmurs. Skin: Warm and dry, without lesions or rashes.  The following portions of the patient's history were reviewed and updated as appropriate: allergies, current medications, past family history, past medical history, past social history, past surgical history and problem list.  Allergies as of 05/22/2016      Reactions   Ibuprofen Swelling   Ciprofloxacin Rash   Clarithromycin Nausea And Vomiting   Codeine Rash   Swelling   Doxycycline Nausea And Vomiting  Metoclopramide Rash   PT DOES NOT KNOW   Omnipaque [iohexol] Other (See Comments)   Kidneys bleed Kidneys bleed   Sulfasalazine Rash   Phenylpropanol Nausea And Vomiting    Butorphanol Tartrate Nausea And Vomiting, Nausea Only   Darvocet [propoxyphene N-acetaminophen] Nausea Only   Other Rash   Fluoroquinolones   Ranitidine Hcl Rash   Stadol [butorphanol Tartrate] Nausea Only   Sulfa Antibiotics Rash      Medication List       Accurate as of 05/22/16  5:11 PM. Always use your most recent med list.          acyclovir 400 MG tablet Commonly known as:  ZOVIRAX Take 400 mg by mouth 2 (two) times daily.   albuterol (2.5 MG/3ML) 0.083% nebulizer solution Commonly known as:  PROVENTIL Take 3 mLs (2.5 mg total) by nebulization every 4 (four) hours as needed for wheezing or shortness of breath.   albuterol 108 (90 Base) MCG/ACT inhaler Commonly known as:  PROVENTIL HFA;VENTOLIN HFA Inhale 2 puffs into the lungs every 4 (four) hours as needed for wheezing or shortness of breath.   atenolol 25 MG tablet Commonly known as:  TENORMIN Take 25 mg by mouth daily.   budesonide-formoterol 80-4.5 MCG/ACT inhaler Commonly known as:  SYMBICORT Inhale 2 puffs into the lungs 2 (two) times daily. Reported on 06/30/2015   CRESTOR 10 MG tablet Generic drug:  rosuvastatin Take 10 mg by mouth.   DEXILANT 60 MG capsule Generic drug:  dexlansoprazole Take 60 mg by mouth daily.   Diethylpropion HCl CR 75 MG Tb24 Take by mouth.   doxepin 25 MG capsule Commonly known as:  SINEQUAN Take 25 mg by mouth at bedtime.   EPINEPHrine 0.3 mg/0.3 mL Soaj injection Commonly known as:  EPIPEN 2-PAK Inject 0.3 mLs (0.3 mg total) into the muscle once.   escitalopram 10 MG tablet Commonly known as:  LEXAPRO Take 1 a day   estrogens-methylTEST 1.25-2.5 MG tablet Commonly known as:  ESTRATEST TAKE 1 TABLET BY MOUTH EVERY DAY   fluticasone 50 MCG/ACT nasal spray Commonly known as:  FLONASE INSTILL 2 SPRAYS IN EACH NOSTRIL EVERY DAY   gabapentin 600 MG tablet Commonly known as:  NEURONTIN Take 1,200 mg by mouth 2 (two) times daily.   guaifenesin 400 MG Tabs  tablet Commonly known as:  HUMIBID E Take 400 mg by mouth 2 (two) times daily.   HUMALOG 100 UNIT/ML cartridge Generic drug:  insulin lispro Inject into the skin 3 (three) times daily with meals.   Insulin Infusion Pump Devi 80 Units by Insulin Pump route once a week.   levocetirizine 5 MG tablet Commonly known as:  XYZAL Take 5 mg by mouth every evening.   levothyroxine 50 MCG tablet Commonly known as:  SYNTHROID, LEVOTHROID Take 50 mcg by mouth daily before breakfast.   linaclotide 290 MCG Caps capsule Commonly known as:  LINZESS Take by mouth.   LORazepam 0.5 MG tablet Commonly known as:  ATIVAN Take 1 or 2 at night as needed   lubiprostone 8 MCG capsule Commonly known as:  AMITIZA Take 8 mcg by mouth 2 (two) times daily with a meal.   metolazone 2.5 MG tablet Commonly known as:  ZAROXOLYN Take 2.5 mg by mouth 2 (two) times daily.   montelukast 10 MG tablet Commonly known as:  SINGULAIR Take 10 mg by mouth daily.   nystatin 100000 UNIT/ML suspension Commonly known as:  MYCOSTATIN SWISH AND SPIT 4 ML BY MOUTH THREE  TIMES A DAY AS DIRECTED.   ONE TOUCH ULTRA TEST test strip Generic drug:  glucose blood   potassium chloride 20 MEQ packet Commonly known as:  KLOR-CON Take by mouth.   sertraline 50 MG tablet Commonly known as:  ZOLOFT Take 50 mg by mouth.   terbinafine 250 MG tablet Commonly known as:  LAMISIL Take 250 mg by mouth daily.   topiramate 25 MG tablet Commonly known as:  TOPAMAX Take 25 mg by mouth daily.       Allergies  Allergen Reactions  . Ibuprofen Swelling  . Ciprofloxacin Rash  . Clarithromycin Nausea And Vomiting  . Codeine Rash    Swelling  . Doxycycline Nausea And Vomiting  . Metoclopramide Rash    PT DOES NOT KNOW  . Omnipaque [Iohexol] Other (See Comments)    Kidneys bleed Kidneys bleed  . Sulfasalazine Rash  . Phenylpropanol Nausea And Vomiting  . Butorphanol Tartrate Nausea And Vomiting and Nausea Only  .  Darvocet [Propoxyphene N-Acetaminophen] Nausea Only  . Other Rash    Fluoroquinolones  . Ranitidine Hcl Rash  . Stadol [Butorphanol Tartrate] Nausea Only  . Sulfa Antibiotics Rash   Review of systems: Review of systems negative except as noted in HPI / PMHx or noted below: Constitutional: Negative.  HENT: Negative.   Eyes: Negative.  Respiratory: Negative.   Cardiovascular: Negative.  Gastrointestinal: Negative.  Genitourinary: Negative.  Musculoskeletal: Negative.  Neurological: Negative.  Endo/Heme/Allergies: Negative.  Cutaneous: Negative.  Past Medical History:  Diagnosis Date  . Asthma   . Diabetes mellitus   . GERD (gastroesophageal reflux disease)   . Hypertension   . Hypokalemia     Family History  Problem Relation Age of Onset  . Asthma Mother   . Allergic rhinitis Father   . Asthma Brother   . Angioedema Neg Hx   . Atopy Neg Hx   . Eczema Neg Hx   . Immunodeficiency Neg Hx     Social History   Social History  . Marital status: Widowed    Spouse name: N/A  . Number of children: N/A  . Years of education: N/A   Occupational History  . Not on file.   Social History Main Topics  . Smoking status: Never Smoker  . Smokeless tobacco: Never Used  . Alcohol use No  . Drug use: No  . Sexual activity: No   Other Topics Concern  . Not on file   Social History Narrative  . No narrative on file    I appreciate the opportunity to take part in Carolyn Benson's care. Please do not hesitate to contact me with questions.  Sincerely,   R. Jorene Guest, MD

## 2016-05-22 NOTE — Assessment & Plan Note (Signed)
   The patient has been asked to carefully monitor blood glucose levels while on prednisone.  She/He has verbalized understanding and has agreed to do so. 

## 2016-05-22 NOTE — Patient Instructions (Addendum)
Asthma with acute exacerbation  Prednisone has been provided, 20 mg x 4 days, 10 mg x1 day, then stop.  I recommended using Symbicort 80-4.5 g, 2 inhalations via spacer device twice a day, on a scheduled basis rather than as needed basis.  Continue montelukast 10 mg daily bedtime and albuterol HFA, 1-2 inhalations every 4-6 hours as needed.  The patient has been asked to contact me if her symptoms persist or progress. Otherwise, she may return for follow up in 4 months.  Acute sinusitis  Prednisone has been provided (as above).  Continue fluticasone nasal spray, for now she should take this medication twice daily until the sinus infection has resolved.  Nasal saline lavage (NeilMed) has been recommended as needed and prior to medicated nasal sprays along with instructions for proper administration.  For thick post nasal drainage, add guaifenesin 1200 mg (Mucinex Maximum Strength)  twice daily as needed with adequate hydration as discussed.  The patient has been asked to contact me if her symptoms persist, progress, or if she becomes febrile.  Other allergic rhinitis  Continue appropriate allergen avoidance measures and fluticasone nasal spray as needed.  Resume aeroallergen immunotherapy when the current asthma exacerbation and sinus infection have resolved.  Diabetes mellitus (HCC)  The patient has been asked to carefully monitor blood glucose levels while on prednisone.  She/He has verbalized understanding and has agreed to do so.   Return in about 4 months (around 09/21/2016), or if symptoms worsen or fail to improve.

## 2016-06-13 ENCOUNTER — Ambulatory Visit (INDEPENDENT_AMBULATORY_CARE_PROVIDER_SITE_OTHER): Payer: Medicare HMO

## 2016-06-13 DIAGNOSIS — J309 Allergic rhinitis, unspecified: Secondary | ICD-10-CM

## 2016-06-23 DIAGNOSIS — J3089 Other allergic rhinitis: Secondary | ICD-10-CM | POA: Diagnosis not present

## 2016-06-24 DIAGNOSIS — J3089 Other allergic rhinitis: Secondary | ICD-10-CM | POA: Diagnosis not present

## 2016-06-24 NOTE — Progress Notes (Signed)
Vials to be made 06-24-16 jm 

## 2016-06-25 ENCOUNTER — Ambulatory Visit (INDEPENDENT_AMBULATORY_CARE_PROVIDER_SITE_OTHER): Payer: Medicare HMO

## 2016-06-25 DIAGNOSIS — J309 Allergic rhinitis, unspecified: Secondary | ICD-10-CM

## 2016-07-12 ENCOUNTER — Ambulatory Visit (INDEPENDENT_AMBULATORY_CARE_PROVIDER_SITE_OTHER): Payer: Medicare HMO

## 2016-07-12 DIAGNOSIS — J309 Allergic rhinitis, unspecified: Secondary | ICD-10-CM | POA: Diagnosis not present

## 2016-08-02 ENCOUNTER — Ambulatory Visit (INDEPENDENT_AMBULATORY_CARE_PROVIDER_SITE_OTHER): Payer: Medicare HMO | Admitting: *Deleted

## 2016-08-02 DIAGNOSIS — J309 Allergic rhinitis, unspecified: Secondary | ICD-10-CM | POA: Diagnosis not present

## 2016-08-05 ENCOUNTER — Other Ambulatory Visit: Payer: Self-pay | Admitting: *Deleted

## 2016-08-05 MED ORDER — ALBUTEROL SULFATE HFA 108 (90 BASE) MCG/ACT IN AERS: 2.0000 | INHALATION_SPRAY | RESPIRATORY_TRACT | 0 refills | Status: DC | PRN

## 2016-08-09 ENCOUNTER — Ambulatory Visit (INDEPENDENT_AMBULATORY_CARE_PROVIDER_SITE_OTHER): Payer: Medicare HMO

## 2016-08-09 DIAGNOSIS — J309 Allergic rhinitis, unspecified: Secondary | ICD-10-CM

## 2016-08-09 DIAGNOSIS — M542 Cervicalgia: Secondary | ICD-10-CM | POA: Insufficient documentation

## 2016-08-12 ENCOUNTER — Other Ambulatory Visit: Payer: Self-pay

## 2016-08-12 MED ORDER — BUDESONIDE-FORMOTEROL FUMARATE 80-4.5 MCG/ACT IN AERO: 2.0000 | INHALATION_SPRAY | Freq: Two times a day (BID) | RESPIRATORY_TRACT | 5 refills | Status: DC

## 2016-08-12 NOTE — Telephone Encounter (Signed)
Received fax from [facility] in regards to needing a script for Symbicort 80. Patient was last seen 05/22/2016. I sent script to [facility].

## 2016-08-15 ENCOUNTER — Other Ambulatory Visit: Payer: Self-pay

## 2016-08-15 ENCOUNTER — Ambulatory Visit (INDEPENDENT_AMBULATORY_CARE_PROVIDER_SITE_OTHER): Payer: Medicare HMO

## 2016-08-15 DIAGNOSIS — J309 Allergic rhinitis, unspecified: Secondary | ICD-10-CM | POA: Diagnosis not present

## 2016-08-15 MED ORDER — ALBUTEROL SULFATE HFA 108 (90 BASE) MCG/ACT IN AERS: 2.0000 | INHALATION_SPRAY | RESPIRATORY_TRACT | 1 refills | Status: DC | PRN

## 2016-08-15 NOTE — Telephone Encounter (Signed)
Patient here for ITX.  Requesting refill for Ventolin HFA

## 2016-08-29 ENCOUNTER — Ambulatory Visit (INDEPENDENT_AMBULATORY_CARE_PROVIDER_SITE_OTHER): Payer: Medicare HMO

## 2016-08-29 DIAGNOSIS — J309 Allergic rhinitis, unspecified: Secondary | ICD-10-CM

## 2016-09-03 ENCOUNTER — Ambulatory Visit (INDEPENDENT_AMBULATORY_CARE_PROVIDER_SITE_OTHER): Payer: Medicare HMO | Admitting: *Deleted

## 2016-09-03 DIAGNOSIS — J309 Allergic rhinitis, unspecified: Secondary | ICD-10-CM

## 2016-09-26 ENCOUNTER — Ambulatory Visit (INDEPENDENT_AMBULATORY_CARE_PROVIDER_SITE_OTHER): Payer: Medicare HMO | Admitting: Allergy and Immunology

## 2016-09-26 ENCOUNTER — Encounter: Payer: Self-pay | Admitting: Allergy and Immunology

## 2016-09-26 VITALS — BP 110/64 | HR 72 | Temp 98.2°F | Resp 16

## 2016-09-26 DIAGNOSIS — J309 Allergic rhinitis, unspecified: Secondary | ICD-10-CM | POA: Diagnosis not present

## 2016-09-26 DIAGNOSIS — J4540 Moderate persistent asthma, uncomplicated: Secondary | ICD-10-CM | POA: Diagnosis not present

## 2016-09-26 DIAGNOSIS — J3089 Other allergic rhinitis: Secondary | ICD-10-CM | POA: Diagnosis not present

## 2016-09-26 MED ORDER — FLUTICASONE-SALMETEROL 115-21 MCG/ACT IN AERO: INHALATION_SPRAY | RESPIRATORY_TRACT | 5 refills | Status: DC

## 2016-09-26 MED ORDER — ALBUTEROL SULFATE HFA 108 (90 BASE) MCG/ACT IN AERS: 2.0000 | INHALATION_SPRAY | RESPIRATORY_TRACT | 1 refills | Status: DC | PRN

## 2016-09-26 NOTE — Assessment & Plan Note (Signed)
Stable.  Continue appropriate allergen avoidance measures, aeroallergen immunotherapy as prescribed and as tolerated, and fluticasone nasal spray as needed. 

## 2016-09-26 NOTE — Assessment & Plan Note (Signed)
Well-controlled and stable.  A sample and prescription have been provided for Advair HFA 115/21 g, 2 inhalations via spacer device twice a day.  Discontinue Symbicort.  Continue albuterol HFA, 1-2 inhalations every 4-6 hours as needed.  Subjective and objective measures of pulmonary function will be followed and the treatment plan will be adjusted accordingly.

## 2016-09-26 NOTE — Progress Notes (Signed)
Follow-up Note  RE: Carolyn Benson MRN: [id number] DOB: [birth year] Date of Office Visit: 09/26/2016  Primary care provider: Pola Corn, PA-C Referring provider: Pola Corn, P*  History of present illness: Carolyn Benson is a 61 y.o. female with persistent asthma and allergic rhinitis presenting today for follow up.  She was last seen in this clinic on 05/22/2016.  She reports that in the interval since her previous visit her asthma has been well-controlled.  She rarely requires albuterol rescue and denies nocturnal awakenings due to lower respiratory symptoms.  She is currently taking Symbicort 160-4.5 g, 2 inhalations via spacer device twice a day.  She is concerned that this medication is costing over $50 per month and one stenosed there is a less expensive option available.  She is tolerating immunotherapy injections without problems or complications and has no nasal symptom complaints today.   Assessment and plan: Moderate persistent asthma Well-controlled and stable.  A sample and prescription have been provided for Advair HFA 115/21 g, 2 inhalations via spacer device twice a day.  Discontinue Symbicort.  Continue albuterol HFA, 1-2 inhalations every 4-6 hours as needed.  Subjective and objective measures of pulmonary function will be followed and the treatment plan will be adjusted accordingly.  Other allergic rhinitis Stable.  Continue appropriate allergen avoidance measures, aeroallergen immunotherapy as prescribed and as tolerated, and fluticasone nasal spray as needed.   Meds ordered this encounter  Medications  . fluticasone-salmeterol (ADVAIR HFA) 115-21 MCG/ACT inhaler    Sig: 2 puffs twice daily with spacer to prevent coughing or wheezing.    Dispense:  1 Inhaler    Refill:  5  . albuterol (VENTOLIN HFA) 108 (90 Base) MCG/ACT inhaler    Sig: Inhale 2 puffs into the lungs every 4 (four) hours as needed for wheezing or shortness of  breath.    Dispense:  1 Inhaler    Refill:  1    Diagnostics: Spirometry reveals an FVC of 1.66 L and an FEV1 of 1.35 L.  Moderate restrictive pattern which may be due to questionable effort secondary to back pain.   Please see scanned spirometry results for details.    Physical examination: Blood pressure 110/64, pulse 72, temperature 98.2 F (36.8 C), temperature source Oral, resp. rate 16.  General: Alert, interactive, in no acute distress. HEENT: TMs pearly gray, turbinates mildly edematous without discharge, post-pharynx unremarkable. Neck: Supple without lymphadenopathy. Lungs: Clear to auscultation without wheezing, rhonchi or rales. CV: Normal S1, S2 without murmurs. Skin: Warm and dry, without lesions or rashes.  The following portions of the patient's history were reviewed and updated as appropriate: allergies, current medications, past family history, past medical history, past social history, past surgical history and problem list.  Allergies as of 09/26/2016      Reactions   Ibuprofen Swelling   Ciprofloxacin Rash   Clarithromycin Nausea And Vomiting   Codeine Rash   Swelling   Doxycycline Nausea And Vomiting   Metoclopramide Rash   PT DOES NOT KNOW   Omnipaque [iohexol] Other (See Comments)   Kidneys bleed Kidneys bleed   Sulfasalazine Rash   Phenylpropanol Nausea And Vomiting   Butorphanol Tartrate Nausea And Vomiting, Nausea Only   Darvocet [propoxyphene N-acetaminophen] Nausea Only   Other Rash   Fluoroquinolones   Ranitidine Hcl Rash   Stadol [butorphanol Tartrate] Nausea Only   Sulfa Antibiotics Rash      Medication List       Accurate as of 09/26/16 12:14  PM. Always use your most recent med list.          acyclovir 400 MG tablet Commonly known as:  ZOVIRAX Take 400 mg by mouth 2 (two) times daily.   albuterol (2.5 MG/3ML) 0.083% nebulizer solution Commonly known as:  PROVENTIL Take 3 mLs (2.5 mg total) by nebulization every 4 (four) hours  as needed for wheezing or shortness of breath.   albuterol 108 (90 Base) MCG/ACT inhaler Commonly known as:  PROVENTIL HFA;VENTOLIN HFA Inhale into the lungs.   albuterol 108 (90 Base) MCG/ACT inhaler Commonly known as:  VENTOLIN HFA Inhale 2 puffs into the lungs every 4 (four) hours as needed for wheezing or shortness of breath.   atenolol 25 MG tablet Commonly known as:  TENORMIN Take 25 mg by mouth daily.   budesonide-formoterol 160-4.5 MCG/ACT inhaler Commonly known as:  SYMBICORT Inhale 2 puffs into the lungs 2 (two) times daily.   budesonide-formoterol 80-4.5 MCG/ACT inhaler Commonly known as:  SYMBICORT Inhale 2 puffs into the lungs 2 (two) times daily. Reported on 06/30/2015   CRESTOR 10 MG tablet Generic drug:  rosuvastatin Take 10 mg by mouth.   DEXILANT 60 MG capsule Generic drug:  dexlansoprazole Take 60 mg by mouth daily.   Diethylpropion HCl CR 75 MG Tb24 Take by mouth.   doxepin 25 MG capsule Commonly known as:  SINEQUAN Take 25 mg by mouth at bedtime.   EPINEPHrine 0.3 mg/0.3 mL Soaj injection Commonly known as:  EPIPEN 2-PAK Inject 0.3 mLs (0.3 mg total) into the muscle once.   escitalopram 10 MG tablet Commonly known as:  LEXAPRO Take 1 a day   estrogens-methylTEST 1.25-2.5 MG tablet Commonly known as:  ESTRATEST TAKE 1 TABLET BY MOUTH EVERY DAY   fluticasone 50 MCG/ACT nasal spray Commonly known as:  FLONASE INSTILL 2 SPRAYS IN EACH NOSTRIL EVERY DAY   fluticasone-salmeterol 115-21 MCG/ACT inhaler Commonly known as:  ADVAIR HFA 2 puffs twice daily with spacer to prevent coughing or wheezing.   gabapentin 600 MG tablet Commonly known as:  NEURONTIN Take 1,200 mg by mouth 2 (two) times daily.   guaifenesin 400 MG Tabs tablet Commonly known as:  HUMIBID E Take 400 mg by mouth 2 (two) times daily.   HUMALOG 100 UNIT/ML cartridge Generic drug:  insulin lispro Inject into the skin 3 (three) times daily with meals.     HYDROcodone-acetaminophen 10-325 MG tablet Commonly known as:  NORCO TAKE 1 TABLET 4 TIMES A DAY AS NEEDED FOR PAIN(TAKE WITH FOOD/SNACK)   insulin aspart 100 UNIT/ML injection Commonly known as:  novoLOG Use as directed in insulin pump.  MDD: 135 units/day   Insulin Infusion Pump Devi 80 Units by Insulin Pump route once a week.   levocetirizine 5 MG tablet Commonly known as:  XYZAL Take 5 mg by mouth every evening.   levothyroxine 50 MCG tablet Commonly known as:  SYNTHROID, LEVOTHROID Take 50 mcg by mouth daily before breakfast.   linaclotide 290 MCG Caps capsule Commonly known as:  LINZESS Take by mouth.   LORazepam 0.5 MG tablet Commonly known as:  ATIVAN Take 1 or 2 at night as needed   lubiprostone 8 MCG capsule Commonly known as:  AMITIZA Take 8 mcg by mouth 2 (two) times daily with a meal.   metolazone 2.5 MG tablet Commonly known as:  ZAROXOLYN Take 2.5 mg by mouth 2 (two) times daily.   montelukast 10 MG tablet Commonly known as:  SINGULAIR Take 10 mg by mouth daily.  nystatin 100000 UNIT/ML suspension Commonly known as:  MYCOSTATIN SWISH AND SPIT 4 ML BY MOUTH THREE TIMES A DAY AS DIRECTED.   CONTOUR NEXT TEST test strip Generic drug:  glucose blood by Misc.(Non-Drug; Combo Route) route 4 times daily.   ONE TOUCH ULTRA TEST test strip Generic drug:  glucose blood   potassium chloride 20 MEQ packet Commonly known as:  KLOR-CON Take by mouth.   sertraline 50 MG tablet Commonly known as:  ZOLOFT Take 50 mg by mouth.   terbinafine 250 MG tablet Commonly known as:  LAMISIL Take 250 mg by mouth daily.   topiramate 25 MG tablet Commonly known as:  TOPAMAX Take 25 mg by mouth daily.       Allergies  Allergen Reactions  . Ibuprofen Swelling  . Ciprofloxacin Rash  . Clarithromycin Nausea And Vomiting  . Codeine Rash    Swelling  . Doxycycline Nausea And Vomiting  . Metoclopramide Rash    PT DOES NOT KNOW  . Omnipaque [Iohexol] Other  (See Comments)    Kidneys bleed Kidneys bleed  . Sulfasalazine Rash  . Phenylpropanol Nausea And Vomiting  . Butorphanol Tartrate Nausea And Vomiting and Nausea Only  . Darvocet [Propoxyphene N-Acetaminophen] Nausea Only  . Other Rash    Fluoroquinolones  . Ranitidine Hcl Rash  . Stadol [Butorphanol Tartrate] Nausea Only  . Sulfa Antibiotics Rash   Review of systems: Review of systems negative except as noted in HPI / PMHx or noted below: Constitutional: Negative.  HENT: Negative.   Eyes: Negative.  Respiratory: Negative.   Cardiovascular: Negative.  Gastrointestinal: Negative.  Genitourinary: Negative.  Musculoskeletal: Negative.  Neurological: Negative.  Endo/Heme/Allergies: Negative.  Cutaneous: Negative.  Past Medical History:  Diagnosis Date  . Asthma   . Diabetes mellitus   . GERD (gastroesophageal reflux disease)   . Hypertension   . Hypokalemia     Family History  Problem Relation Age of Onset  . Asthma Mother   . Allergic rhinitis Father   . Asthma Brother   . Angioedema Neg Hx   . Atopy Neg Hx   . Eczema Neg Hx   . Immunodeficiency Neg Hx     Social History   Social History  . Marital status: Widowed    Spouse name: N/A  . Number of children: N/A  . Years of education: N/A   Occupational History  . Not on file.   Social History Main Topics  . Smoking status: Never Smoker  . Smokeless tobacco: Never Used  . Alcohol use No  . Drug use: No  . Sexual activity: No   Other Topics Concern  . Not on file   Social History Narrative  . No narrative on file    I appreciate the opportunity to take part in Jeyli's care. Please do not hesitate to contact me with questions.  Sincerely,   R. Jorene Guestarter Patricio Popwell, MD

## 2016-09-26 NOTE — Patient Instructions (Signed)
Moderate persistent asthma Well-controlled and stable.  A sample and prescription have been provided for Advair HFA 115/21 g, 2 inhalations via spacer device twice a day.  Discontinue Symbicort.  Continue albuterol HFA, 1-2 inhalations every 4-6 hours as needed.  Subjective and objective measures of pulmonary function will be followed and the treatment plan will be adjusted accordingly.  Other allergic rhinitis Stable.  Continue appropriate allergen avoidance measures, aeroallergen immunotherapy as prescribed and as tolerated, and fluticasone nasal spray as needed.   Return in about 4 months (around 01/26/2017), or if symptoms worsen or fail to improve.

## 2016-10-02 ENCOUNTER — Other Ambulatory Visit: Payer: Self-pay | Admitting: Physical Medicine and Rehabilitation

## 2016-10-02 DIAGNOSIS — M5416 Radiculopathy, lumbar region: Secondary | ICD-10-CM

## 2016-10-03 ENCOUNTER — Ambulatory Visit
Admission: RE | Admit: 2016-10-03 | Discharge: 2016-10-03 | Disposition: A | Discharge status: Home / Self Care | Payer: Medicare HMO | Source: Ambulatory Visit | Attending: Physical Medicine and Rehabilitation | Admitting: Physical Medicine and Rehabilitation

## 2016-10-03 DIAGNOSIS — M5416 Radiculopathy, lumbar region: Secondary | ICD-10-CM

## 2016-10-22 ENCOUNTER — Ambulatory Visit (INDEPENDENT_AMBULATORY_CARE_PROVIDER_SITE_OTHER): Payer: Medicare HMO

## 2016-10-22 DIAGNOSIS — J309 Allergic rhinitis, unspecified: Secondary | ICD-10-CM

## 2016-10-31 ENCOUNTER — Ambulatory Visit (INDEPENDENT_AMBULATORY_CARE_PROVIDER_SITE_OTHER): Payer: Medicare HMO

## 2016-10-31 DIAGNOSIS — J309 Allergic rhinitis, unspecified: Secondary | ICD-10-CM

## 2016-10-31 DIAGNOSIS — J4540 Moderate persistent asthma, uncomplicated: Secondary | ICD-10-CM

## 2016-10-31 MED ORDER — ALBUTEROL SULFATE HFA 108 (90 BASE) MCG/ACT IN AERS: 2.0000 | INHALATION_SPRAY | RESPIRATORY_TRACT | 1 refills | Status: DC | PRN

## 2016-11-26 ENCOUNTER — Ambulatory Visit (INDEPENDENT_AMBULATORY_CARE_PROVIDER_SITE_OTHER): Payer: Medicare HMO | Admitting: *Deleted

## 2016-11-26 DIAGNOSIS — J309 Allergic rhinitis, unspecified: Secondary | ICD-10-CM

## 2016-12-17 ENCOUNTER — Telehealth: Payer: Self-pay | Admitting: Allergy and Immunology

## 2016-12-17 ENCOUNTER — Ambulatory Visit (INDEPENDENT_AMBULATORY_CARE_PROVIDER_SITE_OTHER): Payer: Medicare HMO | Admitting: *Deleted

## 2016-12-17 DIAGNOSIS — J309 Allergic rhinitis, unspecified: Secondary | ICD-10-CM

## 2016-12-17 NOTE — Telephone Encounter (Signed)
Patient came in today for shots and made payment on her account but says the remaining $25.00 on account is incorrect for she has never not come to an appointment and this is reflected in her history as well. Please disregard this no show fee for this patient as she has attended all scheduled OV's. Thanks.

## 2016-12-18 NOTE — Telephone Encounter (Signed)
The $25 fee was from a NSF charge. I explained this to [place] and she said when she gets home she will call us and make a payment.

## 2017-01-08 ENCOUNTER — Other Ambulatory Visit: Payer: Self-pay | Admitting: Allergy and Immunology

## 2017-01-08 ENCOUNTER — Ambulatory Visit (INDEPENDENT_AMBULATORY_CARE_PROVIDER_SITE_OTHER): Payer: Medicare HMO

## 2017-01-08 DIAGNOSIS — J309 Allergic rhinitis, unspecified: Secondary | ICD-10-CM | POA: Diagnosis not present

## 2017-01-08 DIAGNOSIS — J4540 Moderate persistent asthma, uncomplicated: Secondary | ICD-10-CM

## 2017-01-15 ENCOUNTER — Ambulatory Visit (INDEPENDENT_AMBULATORY_CARE_PROVIDER_SITE_OTHER): Payer: Medicare HMO

## 2017-01-15 DIAGNOSIS — J309 Allergic rhinitis, unspecified: Secondary | ICD-10-CM

## 2017-01-23 ENCOUNTER — Ambulatory Visit: Payer: Medicare HMO | Admitting: Allergy and Immunology

## 2017-02-05 ENCOUNTER — Encounter: Payer: Self-pay | Admitting: Allergy and Immunology

## 2017-02-05 ENCOUNTER — Ambulatory Visit (INDEPENDENT_AMBULATORY_CARE_PROVIDER_SITE_OTHER): Payer: Medicare HMO | Admitting: Allergy and Immunology

## 2017-02-05 VITALS — BP 104/60 | HR 78 | Temp 98.6°F | Resp 18 | Ht 62.8 in | Wt 196.8 lb

## 2017-02-05 DIAGNOSIS — J0100 Acute maxillary sinusitis, unspecified: Secondary | ICD-10-CM

## 2017-02-05 DIAGNOSIS — J45901 Unspecified asthma with (acute) exacerbation: Secondary | ICD-10-CM | POA: Diagnosis not present

## 2017-02-05 MED ORDER — PREDNISONE 1 MG PO TABS: 10.0000 mg | ORAL_TABLET | Freq: Every day | ORAL | Status: AC

## 2017-02-05 MED ORDER — AMOXICILLIN-POT CLAVULANATE 875-125 MG PO TABS: ORAL_TABLET | ORAL | 0 refills | Status: DC

## 2017-02-05 NOTE — Assessment & Plan Note (Signed)
   Prednisone has been provided, 40 mg x3 days, 20 mg x1 day, 10 mg x1 day, then stop.  Continue Advair HFA 115-21 g, 2 inhalations via spacer device twice daily, montelukast 10 mg daily at bedtime, and albuterol every 4-6 hours if needed.  Return for follow up in 4 months, or sooner if the problem persists or progresses.

## 2017-02-05 NOTE — Patient Instructions (Addendum)
Asthma with acute exacerbation  Prednisone has been provided, 40 mg x3 days, 20 mg x1 day, 10 mg x1 day, then stop.  Continue Advair HFA 115-21 g, 2 inhalations via spacer device twice daily, montelukast 10 mg daily at bedtime, and albuterol every 4-6 hours if needed.  Return for follow up in 4 months, or sooner if the problem persists or progresses.  Acute sinusitis  Prednisone has been provided (as above).  A prescription has been provided for Augmentin 875/125 mg twice daily x 10 days.  Continue fluticasone nasal spray, for now she should take this medication twice daily until the sinus infection has resolved.  Nasal saline lavage (NeilMed) has been recommended as needed and prior to medicated nasal sprays along with instructions for proper administration.  For thick post nasal drainage, add guaifenesin 1200 mg (Mucinex Maximum Strength)  twice daily as needed with adequate hydration as discussed.   Return in about 4 months (around 06/06/2017), or if symptoms worsen or fail to improve.

## 2017-02-05 NOTE — Progress Notes (Signed)
Follow-up Note  RE: Carolyn Benson MRN: [id number] DOB: Sep 30, 1955 Date of Office Visit: 02/05/2017  Primary care provider: Pola CornJones, Autumn Sharie, PA-C Referring provider: Pola CornJones, Autumn Sharie, P*  History of present illness: Carolyn Benson is a 61 y.o. female with persistent asthma and allergic rhinitis presenting today for sick visit.  She was last seen in this clinic on September 26, 2016.  Approximately 3 weeks ago, she began to experience symptoms consistent with an upper respiratory tract infection, which she believes were triggered by the rapid weather changes.  Over the past 2 weeks she has experienced sneezing, sore throat, hoarseness, coughing, and chest tightness.  She was started on a Z-Pak for bronchitis without benefit.  Tests for strep throat and flu were negative.  Last week she went to see her primary care physician and was given a steroid injection and started on Cefdinir which she finished yesterday.  However, her symptoms have persisted and she has been experiencing chills and myalgia over the past 24 hours.   Assessment and plan: Asthma with acute exacerbation  Prednisone has been provided, 40 mg x3 days, 20 mg x1 day, 10 mg x1 day, then stop.  Continue Advair HFA 115-21 g, 2 inhalations via spacer device twice daily, montelukast 10 mg daily at bedtime, and albuterol every 4-6 hours if needed.  Return for follow up in 4 months, or sooner if the problem persists or progresses.  Acute sinusitis  Prednisone has been provided (as above).  A prescription has been provided for Augmentin 875/125 mg twice daily x 10 days.  Continue fluticasone nasal spray, for now she should take this medication twice daily until the sinus infection has resolved.  Nasal saline lavage (NeilMed) has been recommended as needed and prior to medicated nasal sprays along with instructions for proper administration.  For thick post nasal drainage, add guaifenesin 1200 mg (Mucinex Maximum  Strength)  twice daily as needed with adequate hydration as discussed.   Meds ordered this encounter  Medications  . amoxicillin-clavulanate (AUGMENTIN) 875-125 MG tablet    Sig: 1 tablet twice a day for 10 days for infection.    Dispense:  20 tablet    Refill:  0  . predniSONE (DELTASONE) tablet 10 mg    Diagnostics: Spirometry reveals an FVC of 1.90 L and an FEV1 of 1.59 L (65% predicted) without significant postbronchodilator improvement.  Please see scanned spirometry results for details.    Physical examination: Blood pressure 104/60, pulse 78, temperature 98.6 F (37 C), temperature source Oral, resp. rate 18, height 5' 2.8" (1.595 m), weight 196 lb 12.8 oz (89.3 kg).  General: Alert, interactive, in no acute distress. HEENT: TMs pearly gray, turbinates edematous with thick discharge, post-pharynx moderately erythematous. Neck: Supple without lymphadenopathy. Lungs: Mildly decreased breath sounds bilaterally without wheezing, rhonchi or rales. CV: Normal S1, S2 without murmurs. Skin: Warm and dry, without lesions or rashes.  The following portions of the patient's history were reviewed and updated as appropriate: allergies, current medications, past family history, past medical history, past social history, past surgical history and problem list.  Allergies as of 02/05/2017      Reactions   Ibuprofen Swelling   Ciprofloxacin Rash   Clarithromycin Nausea And Vomiting   Codeine Rash   Swelling   Doxycycline Nausea And Vomiting   Metoclopramide Rash   PT DOES NOT KNOW   Omnipaque [iohexol] Other (See Comments)   Kidneys bleed Kidneys bleed   Sulfasalazine Rash   Phenylpropanol Nausea And  Vomiting   Butorphanol Tartrate Nausea And Vomiting, Nausea Only   Darvocet [propoxyphene N-acetaminophen] Nausea Only   Other Rash   Fluoroquinolones   Ranitidine Hcl Rash   Stadol [butorphanol Tartrate] Nausea Only   Sulfa Antibiotics Rash      Medication List         Accurate as of 02/05/17  1:42 PM. Always use your most recent med list.          acyclovir 400 MG tablet Commonly known as:  ZOVIRAX Take 400 mg by mouth 2 (two) times daily.   albuterol (2.5 MG/3ML) 0.083% nebulizer solution Commonly known as:  PROVENTIL Take 3 mLs (2.5 mg total) by nebulization every 4 (four) hours as needed for wheezing or shortness of breath.   albuterol 108 (90 Base) MCG/ACT inhaler Commonly known as:  PROVENTIL HFA;VENTOLIN HFA Inhale 2 puffs into the lungs every 4 (four) hours as needed for wheezing or shortness of breath.   amoxicillin-clavulanate 875-125 MG tablet Commonly known as:  AUGMENTIN 1 tablet twice a day for 10 days for infection.   atenolol 25 MG tablet Commonly known as:  TENORMIN Take 25 mg by mouth daily.   azithromycin 250 MG tablet Commonly known as:  ZITHROMAX TAKE 2 TABLETS BY MOUTH TODAY, THEN TAKE 1 TABLET DAILY FOR 4 DAYS   cefdinir 300 MG capsule Commonly known as:  OMNICEF Take 300 mg by mouth 2 (two) times daily.   CRESTOR 10 MG tablet Generic drug:  rosuvastatin Take 10 mg by mouth.   DEXILANT 60 MG capsule Generic drug:  dexlansoprazole Take 60 mg by mouth daily.   Diethylpropion HCl CR 75 MG Tb24 Take by mouth.   doxepin 25 MG capsule Commonly known as:  SINEQUAN Take 25 mg by mouth at bedtime.   EPINEPHrine 0.3 mg/0.3 mL Soaj injection Commonly known as:  EPIPEN 2-PAK Inject 0.3 mLs (0.3 mg total) into the muscle once.   escitalopram 10 MG tablet Commonly known as:  LEXAPRO Take 1 a day   estrogens-methylTEST 1.25-2.5 MG tablet Commonly known as:  ESTRATEST TAKE 1 TABLET BY MOUTH EVERY DAY   fluticasone 50 MCG/ACT nasal spray Commonly known as:  FLONASE INSTILL 2 SPRAYS IN EACH NOSTRIL EVERY DAY   fluticasone-salmeterol 115-21 MCG/ACT inhaler Commonly known as:  ADVAIR HFA 2 puffs twice daily with spacer to prevent coughing or wheezing.   gabapentin 600 MG tablet Commonly known as:   NEURONTIN Take 1,200 mg by mouth 2 (two) times daily.   guaifenesin 400 MG Tabs tablet Commonly known as:  HUMIBID E Take 400 mg by mouth 2 (two) times daily.   HUMALOG 100 UNIT/ML cartridge Generic drug:  insulin lispro Inject into the skin 3 (three) times daily with meals.   HYDROcodone-acetaminophen 10-325 MG tablet Commonly known as:  NORCO TAKE 1 TABLET 4 TIMES A DAY AS NEEDED FOR PAIN(TAKE WITH FOOD/SNACK)   insulin aspart 100 UNIT/ML injection Commonly known as:  novoLOG Use as directed in insulin pump.  MDD: 135 units/day   Insulin Infusion Pump Devi 80 Units by Insulin Pump route once a week.   levocetirizine 5 MG tablet Commonly known as:  XYZAL Take 5 mg by mouth every evening.   levothyroxine 50 MCG tablet Commonly known as:  SYNTHROID, LEVOTHROID Take 50 mcg by mouth daily before breakfast.   linaclotide 290 MCG Caps capsule Commonly known as:  LINZESS Take by mouth.   LORazepam 0.5 MG tablet Commonly known as:  ATIVAN Take 1 or 2 at  night as needed   lubiprostone 8 MCG capsule Commonly known as:  AMITIZA Take 8 mcg by mouth 2 (two) times daily with a meal.   metolazone 2.5 MG tablet Commonly known as:  ZAROXOLYN Take 2.5 mg by mouth 2 (two) times daily.   montelukast 10 MG tablet Commonly known as:  SINGULAIR Take 10 mg by mouth daily.   CONTOUR NEXT TEST test strip Generic drug:  glucose blood by Misc.(Non-Drug; Combo Route) route 4 times daily.   ONE TOUCH ULTRA TEST test strip Generic drug:  glucose blood   potassium chloride 20 MEQ packet Commonly known as:  KLOR-CON Take by mouth.   sertraline 50 MG tablet Commonly known as:  ZOLOFT Take 50 mg by mouth.   topiramate 25 MG tablet Commonly known as:  TOPAMAX Take 25 mg by mouth daily.       Allergies  Allergen Reactions  . Ibuprofen Swelling  . Ciprofloxacin Rash  . Clarithromycin Nausea And Vomiting  . Codeine Rash    Swelling  . Doxycycline Nausea And Vomiting  .  Metoclopramide Rash    PT DOES NOT KNOW  . Omnipaque [Iohexol] Other (See Comments)    Kidneys bleed Kidneys bleed  . Sulfasalazine Rash  . Phenylpropanol Nausea And Vomiting  . Butorphanol Tartrate Nausea And Vomiting and Nausea Only  . Darvocet [Propoxyphene N-Acetaminophen] Nausea Only  . Other Rash    Fluoroquinolones  . Ranitidine Hcl Rash  . Stadol [Butorphanol Tartrate] Nausea Only  . Sulfa Antibiotics Rash    Review of systems: Review of systems negative except as noted in HPI / PMHx or noted below: Constitutional: Negative.  HENT: Negative.   Eyes: Negative.  Respiratory: Negative.   Cardiovascular: Negative.  Gastrointestinal: Negative.  Genitourinary: Negative.  Musculoskeletal: Negative.  Neurological: Negative.  Endo/Heme/Allergies: Negative.  Cutaneous: Negative.   Past Medical History:  Diagnosis Date  . Asthma   . Diabetes mellitus   . GERD (gastroesophageal reflux disease)   . Hypertension   . Hypokalemia     Family History  Problem Relation Age of Onset  . Asthma Mother   . Allergic rhinitis Father   . Asthma Brother   . Angioedema Neg Hx   . Atopy Neg Hx   . Eczema Neg Hx   . Immunodeficiency Neg Hx     Social History   Socioeconomic History  . Marital status: Widowed    Spouse name: Not on file  . Number of children: Not on file  . Years of education: Not on file  . Highest education level: Not on file  Social Needs  . Financial resource strain: Not on file  . Food insecurity - worry: Not on file  . Food insecurity - inability: Not on file  . Transportation needs - medical: Not on file  . Transportation needs - non-medical: Not on file  Occupational History  . Not on file  Tobacco Use  . Smoking status: Never Smoker  . Smokeless tobacco: Never Used  Substance and Sexual Activity  . Alcohol use: No  . Drug use: No  . Sexual activity: No  Other Topics Concern  . Not on file  Social History Narrative  . Not on file    I  appreciate the opportunity to take part in Misk's care. Please do not hesitate to contact me with questions.  Sincerely,   R. Jorene Guest, MD

## 2017-02-05 NOTE — Assessment & Plan Note (Signed)
   Prednisone has been provided (as above).  A prescription has been provided for Augmentin 875/125 mg twice daily x 10 days.  Continue fluticasone nasal spray, for now she should take this medication twice daily until the sinus infection has resolved.  Nasal saline lavage (NeilMed) has been recommended as needed and prior to medicated nasal sprays along with instructions for proper administration.  For thick post nasal drainage, add guaifenesin 1200 mg (Mucinex Maximum Strength)  twice daily as needed with adequate hydration as discussed.

## 2017-03-20 ENCOUNTER — Ambulatory Visit (INDEPENDENT_AMBULATORY_CARE_PROVIDER_SITE_OTHER): Payer: Medicare HMO | Admitting: *Deleted

## 2017-03-20 DIAGNOSIS — J309 Allergic rhinitis, unspecified: Secondary | ICD-10-CM | POA: Diagnosis not present

## 2017-03-24 DIAGNOSIS — J3089 Other allergic rhinitis: Secondary | ICD-10-CM | POA: Diagnosis not present

## 2017-03-27 ENCOUNTER — Ambulatory Visit (INDEPENDENT_AMBULATORY_CARE_PROVIDER_SITE_OTHER): Payer: Medicare HMO

## 2017-03-27 DIAGNOSIS — J309 Allergic rhinitis, unspecified: Secondary | ICD-10-CM | POA: Diagnosis not present

## 2017-04-03 ENCOUNTER — Ambulatory Visit (INDEPENDENT_AMBULATORY_CARE_PROVIDER_SITE_OTHER): Payer: Medicare HMO | Admitting: *Deleted

## 2017-04-03 DIAGNOSIS — J309 Allergic rhinitis, unspecified: Secondary | ICD-10-CM | POA: Diagnosis not present

## 2017-04-11 ENCOUNTER — Ambulatory Visit (INDEPENDENT_AMBULATORY_CARE_PROVIDER_SITE_OTHER): Payer: Medicare HMO

## 2017-04-11 DIAGNOSIS — J309 Allergic rhinitis, unspecified: Secondary | ICD-10-CM | POA: Diagnosis not present

## 2017-04-18 ENCOUNTER — Ambulatory Visit (INDEPENDENT_AMBULATORY_CARE_PROVIDER_SITE_OTHER): Payer: Medicare HMO

## 2017-04-18 DIAGNOSIS — J309 Allergic rhinitis, unspecified: Secondary | ICD-10-CM

## 2017-04-24 ENCOUNTER — Ambulatory Visit (INDEPENDENT_AMBULATORY_CARE_PROVIDER_SITE_OTHER): Payer: Medicare HMO | Admitting: *Deleted

## 2017-04-24 DIAGNOSIS — J309 Allergic rhinitis, unspecified: Secondary | ICD-10-CM

## 2017-04-29 ENCOUNTER — Ambulatory Visit (INDEPENDENT_AMBULATORY_CARE_PROVIDER_SITE_OTHER): Payer: Medicare HMO | Admitting: *Deleted

## 2017-04-29 DIAGNOSIS — J309 Allergic rhinitis, unspecified: Secondary | ICD-10-CM

## 2017-05-06 ENCOUNTER — Ambulatory Visit (INDEPENDENT_AMBULATORY_CARE_PROVIDER_SITE_OTHER): Payer: Medicare HMO

## 2017-05-06 DIAGNOSIS — J309 Allergic rhinitis, unspecified: Secondary | ICD-10-CM

## 2017-05-16 ENCOUNTER — Ambulatory Visit (INDEPENDENT_AMBULATORY_CARE_PROVIDER_SITE_OTHER): Payer: Medicare HMO

## 2017-05-16 DIAGNOSIS — J309 Allergic rhinitis, unspecified: Secondary | ICD-10-CM

## 2017-05-23 ENCOUNTER — Ambulatory Visit (INDEPENDENT_AMBULATORY_CARE_PROVIDER_SITE_OTHER): Payer: Medicare HMO | Admitting: *Deleted

## 2017-05-23 DIAGNOSIS — J309 Allergic rhinitis, unspecified: Secondary | ICD-10-CM | POA: Diagnosis not present

## 2017-05-23 MED ORDER — FLUTICASONE PROPIONATE 50 MCG/ACT NA SUSP: NASAL | 3 refills | Status: DC

## 2017-05-26 DIAGNOSIS — J3089 Other allergic rhinitis: Secondary | ICD-10-CM | POA: Diagnosis not present

## 2017-05-29 ENCOUNTER — Ambulatory Visit (INDEPENDENT_AMBULATORY_CARE_PROVIDER_SITE_OTHER): Payer: Medicare HMO

## 2017-05-29 DIAGNOSIS — J309 Allergic rhinitis, unspecified: Secondary | ICD-10-CM | POA: Diagnosis not present

## 2017-06-05 DIAGNOSIS — J3089 Other allergic rhinitis: Secondary | ICD-10-CM | POA: Diagnosis not present

## 2017-06-05 NOTE — Progress Notes (Signed)
VIALS EXP 06-07-18 

## 2017-06-06 ENCOUNTER — Ambulatory Visit (INDEPENDENT_AMBULATORY_CARE_PROVIDER_SITE_OTHER): Payer: Medicare HMO

## 2017-06-06 DIAGNOSIS — J309 Allergic rhinitis, unspecified: Secondary | ICD-10-CM

## 2017-06-09 DIAGNOSIS — J301 Allergic rhinitis due to pollen: Secondary | ICD-10-CM | POA: Diagnosis not present

## 2017-06-10 ENCOUNTER — Ambulatory Visit (INDEPENDENT_AMBULATORY_CARE_PROVIDER_SITE_OTHER): Payer: Medicare HMO

## 2017-06-10 DIAGNOSIS — J309 Allergic rhinitis, unspecified: Secondary | ICD-10-CM | POA: Diagnosis not present

## 2017-06-11 ENCOUNTER — Ambulatory Visit: Payer: Medicare HMO | Admitting: Allergy and Immunology

## 2017-06-26 ENCOUNTER — Ambulatory Visit: Payer: Medicare HMO | Admitting: Allergy and Immunology

## 2017-06-26 DIAGNOSIS — J309 Allergic rhinitis, unspecified: Secondary | ICD-10-CM

## 2017-07-01 ENCOUNTER — Ambulatory Visit (INDEPENDENT_AMBULATORY_CARE_PROVIDER_SITE_OTHER): Payer: Medicare HMO

## 2017-07-01 DIAGNOSIS — J309 Allergic rhinitis, unspecified: Secondary | ICD-10-CM | POA: Diagnosis not present

## 2017-07-10 ENCOUNTER — Ambulatory Visit: Payer: Medicare HMO | Admitting: Family Medicine

## 2017-07-10 ENCOUNTER — Ambulatory Visit: Payer: Self-pay

## 2017-07-10 ENCOUNTER — Encounter: Payer: Self-pay | Admitting: Family Medicine

## 2017-07-10 VITALS — BP 118/72 | HR 91 | Temp 98.4°F | Resp 16

## 2017-07-10 DIAGNOSIS — Z4681 Encounter for fitting and adjustment of insulin pump: Secondary | ICD-10-CM

## 2017-07-10 DIAGNOSIS — J4540 Moderate persistent asthma, uncomplicated: Secondary | ICD-10-CM

## 2017-07-10 DIAGNOSIS — K219 Gastro-esophageal reflux disease without esophagitis: Secondary | ICD-10-CM

## 2017-07-10 DIAGNOSIS — Z79899 Other long term (current) drug therapy: Secondary | ICD-10-CM

## 2017-07-10 DIAGNOSIS — J309 Allergic rhinitis, unspecified: Secondary | ICD-10-CM

## 2017-07-10 MED ORDER — FLUTICASONE-SALMETEROL 115-21 MCG/ACT IN AERO: INHALATION_SPRAY | RESPIRATORY_TRACT | 5 refills | Status: DC

## 2017-07-10 MED ORDER — ALBUTEROL SULFATE (2.5 MG/3ML) 0.083% IN NEBU: 2.5000 mg | INHALATION_SOLUTION | RESPIRATORY_TRACT | 1 refills | Status: DC | PRN

## 2017-07-10 MED ORDER — ALBUTEROL SULFATE HFA 108 (90 BASE) MCG/ACT IN AERS: 2.0000 | INHALATION_SPRAY | RESPIRATORY_TRACT | 1 refills | Status: DC | PRN
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
## 2017-07-10 NOTE — Patient Instructions (Addendum)
Asthma - Continue montelukast 10 mg once a day to prevent cough or wheeze - Continue Proventil inhaler 2 puffs every 4 hours as needed or albuterol via nebulizer 1 unit dose once every 4 hours as needed for cough or wheeze - Begin Advair 115-21 take 2 puffs twice a day with a spacer  Allergic rhinitis - Continue with your allergy shots on the current schedule.  - Continue Flonase nasal spray as needed for a stuffy nose - Continue Xyzal once a day as needed for a runny nose.  - Continue allergen avoidance measures.   Continue the other medications as listed in your chart.  Call me if this plan is not working well for you  Follow up in 2 months or sooner if needed

## 2017-07-17 ENCOUNTER — Ambulatory Visit (INDEPENDENT_AMBULATORY_CARE_PROVIDER_SITE_OTHER): Payer: Medicare HMO

## 2017-07-17 DIAGNOSIS — J309 Allergic rhinitis, unspecified: Secondary | ICD-10-CM

## 2017-07-22 ENCOUNTER — Ambulatory Visit (INDEPENDENT_AMBULATORY_CARE_PROVIDER_SITE_OTHER): Payer: Medicare HMO

## 2017-07-22 DIAGNOSIS — J309 Allergic rhinitis, unspecified: Secondary | ICD-10-CM

## 2017-07-31 ENCOUNTER — Ambulatory Visit (INDEPENDENT_AMBULATORY_CARE_PROVIDER_SITE_OTHER): Payer: Medicare HMO

## 2017-07-31 DIAGNOSIS — J309 Allergic rhinitis, unspecified: Secondary | ICD-10-CM | POA: Diagnosis not present

## 2017-08-25 ENCOUNTER — Ambulatory Visit (INDEPENDENT_AMBULATORY_CARE_PROVIDER_SITE_OTHER): Payer: Medicare HMO | Admitting: *Deleted

## 2017-08-25 DIAGNOSIS — J309 Allergic rhinitis, unspecified: Secondary | ICD-10-CM | POA: Diagnosis not present

## 2017-09-18 ENCOUNTER — Ambulatory Visit (INDEPENDENT_AMBULATORY_CARE_PROVIDER_SITE_OTHER): Payer: Medicare HMO

## 2017-09-18 DIAGNOSIS — J309 Allergic rhinitis, unspecified: Secondary | ICD-10-CM

## 2017-10-17 ENCOUNTER — Ambulatory Visit (INDEPENDENT_AMBULATORY_CARE_PROVIDER_SITE_OTHER): Payer: Medicare HMO

## 2017-10-17 DIAGNOSIS — J309 Allergic rhinitis, unspecified: Secondary | ICD-10-CM | POA: Diagnosis not present

## 2017-11-13 ENCOUNTER — Ambulatory Visit (INDEPENDENT_AMBULATORY_CARE_PROVIDER_SITE_OTHER): Payer: Medicare HMO

## 2017-11-13 DIAGNOSIS — J309 Allergic rhinitis, unspecified: Secondary | ICD-10-CM

## 2017-11-18 ENCOUNTER — Ambulatory Visit (INDEPENDENT_AMBULATORY_CARE_PROVIDER_SITE_OTHER): Payer: Medicare HMO

## 2017-11-18 DIAGNOSIS — J309 Allergic rhinitis, unspecified: Secondary | ICD-10-CM | POA: Diagnosis not present

## 2017-12-08 ENCOUNTER — Ambulatory Visit (INDEPENDENT_AMBULATORY_CARE_PROVIDER_SITE_OTHER): Payer: Medicare HMO

## 2017-12-08 DIAGNOSIS — J309 Allergic rhinitis, unspecified: Secondary | ICD-10-CM

## 2017-12-18 ENCOUNTER — Ambulatory Visit: Payer: Medicare HMO | Admitting: Family Medicine

## 2017-12-18 ENCOUNTER — Encounter: Payer: Self-pay | Admitting: Allergy and Immunology

## 2017-12-18 ENCOUNTER — Encounter: Payer: Self-pay | Admitting: Family Medicine

## 2017-12-18 VITALS — BP 108/62 | HR 87 | Temp 99.1°F | Resp 20

## 2017-12-18 DIAGNOSIS — J4540 Moderate persistent asthma, uncomplicated: Secondary | ICD-10-CM

## 2017-12-18 DIAGNOSIS — E1169 Type 2 diabetes mellitus with other specified complication: Secondary | ICD-10-CM

## 2017-12-18 DIAGNOSIS — Z79899 Other long term (current) drug therapy: Secondary | ICD-10-CM

## 2017-12-18 DIAGNOSIS — K219 Gastro-esophageal reflux disease without esophagitis: Secondary | ICD-10-CM | POA: Diagnosis not present

## 2017-12-18 DIAGNOSIS — J309 Allergic rhinitis, unspecified: Secondary | ICD-10-CM | POA: Diagnosis not present

## 2017-12-18 LAB — PULMONARY FUNCTION TEST

## 2017-12-18 MED ORDER — ALBUTEROL SULFATE HFA 108 (90 BASE) MCG/ACT IN AERS: 2.0000 | INHALATION_SPRAY | RESPIRATORY_TRACT | 0 refills | Status: DC | PRN

## 2017-12-18 NOTE — Progress Notes (Addendum)
[address] [place] [address] Dept: [phone]  FOLLOW UP NOTE  Patient ID: Carolyn Benson, female    DOB: [birth year]  Age: 62 y.o. MRN: [id number] Date of Office Visit: 12/18/2017  Assessment  Chief Complaint: Asthma (doing well)  HPI ELAYNE Benson is a 62 year old female who presents to the clinic for a follow up visit. She was last seen in this office on 07/10/2017 by Thermon Leyland, NP for evaluation of asthma, allergic rhinitis, and reflux. At today's visit, she reports her asthma has been well controlled with no shortness of breath, cough, or wheeze with activity or rest. She denies asthma symptoms throughout the night. She reports she did not get Advair 115/21 after her last visit to this office due to cost. She is currently taking montelukast 10 mg once a day and using ProAir about 2 times a week. She reports that she has used Advair before and could not remember if it made her feel better. She reports 2 recent sinus infections with each one requiring antibiotics and prednisone for resolution. She reports that she is having some clear drainage now. She is using Flonase and sinus rinses once a day. She reports her allergy injections are going well and she has had a significant decrease in her symptoms while receiving injections. Reflux is reported as well controlled with Dexilant from her primary care provider. Her current medications are listed in the chart.    Drug Allergies:  Allergies  Allergen Reactions  . Ibuprofen Swelling  . Ciprofloxacin Rash  . Clarithromycin Nausea And Vomiting  . Codeine Rash    Swelling  . Doxycycline Nausea And Vomiting  . Metoclopramide Rash    PT DOES NOT KNOW  . Omnipaque [Iohexol] Other (See Comments)    Kidneys bleed Kidneys bleed  . Sulfasalazine Rash  . Phenylpropanol Nausea And Vomiting  . Butorphanol Nausea Only  . Butorphanol Tartrate Nausea And Vomiting and Nausea Only  . Darvocet [Propoxyphene N-Acetaminophen] Nausea  Only  . Other Rash    Fluoroquinolones  . Ranitidine Hcl Rash  . Stadol [Butorphanol Tartrate] Nausea Only  . Sulfa Antibiotics Rash    Physical Exam: BP 108/62   Pulse 87   Temp 99.1 F (37.3 C) (Oral)   Resp 20   SpO2 95%    Physical Exam  Constitutional: She is oriented to person, place, and time. She appears well-developed and well-nourished.  HENT:  Head: Normocephalic.  Right Ear: External ear normal.  Left Ear: External ear normal.  Nose: Nose normal.  Mouth/Throat: Oropharynx is clear and moist.  Bilateral nares normal.  Pharynx normal.  Ears normal.  Eyes normal.  Eyes: Conjunctivae are normal.  Neck: Normal range of motion. Neck supple.  Cardiovascular: Normal rate, regular rhythm and normal heart sounds.  No murmur noted  Pulmonary/Chest: Effort normal and breath sounds normal.  Lungs clear to auscultation  Musculoskeletal: Normal range of motion.  Neurological: She is alert and oriented to person, place, and time.  Skin: Skin is warm and dry.  Psychiatric: She has a normal mood and affect. Her behavior is normal. Judgment and thought content normal.  Vitals reviewed.   Diagnostics: FVC 1.92, FEV1 1.54.  Predicted FVC 3.13, predicted FEV1 2.41.  Spirometry indicates moderate restriction.  This is consistent with previous spirometry readings.  Assessment and Plan: 1. Moderate persistent asthma without complication   2. Allergic rhinitis, unspecified seasonality, unspecified trigger   3. Current use of beta blocker   4.  Gastroesophageal reflux disease without esophagitis   5. Type 2 diabetes mellitus with other specified complication, unspecified whether long term insulin use (HCC)     Meds ordered this encounter  Medications  . albuterol (PROVENTIL HFA;VENTOLIN HFA) 108 (90 Base) MCG/ACT inhaler    Sig: Inhale 2 puffs into the lungs every 4 (four) hours as needed for wheezing or shortness of breath.    Dispense:  3 Inhaler    Refill:  0    90 day  supply    Patient Instructions  Asthma - Begin Dulera 100- 2 puffs twice a day with a spacer (samples given) - Continue montelukast 10 mg once a day to prevent cough or wheeze - Continue Proventil inhaler 2 puffs every 4 hours as needed or albuterol via nebulizer 1 unit dose once every 4 hours as needed for cough or wheeze   Allergic rhinitis - Continue with your allergy shots on the current schedule.  - Continue Flonase nasal spray as needed for a stuffy nose - Continue Xyzal once a day as needed for a runny nose.  - Continue allergen avoidance measures.   Reflux - Continue Dexilant 60 mg as ordered to decrease reflux. This may help with wheezing - Continue lifestyle modifications to decrease reflux  Continue the other medications as listed in your chart.  Call me if this plan is not working well for you  Follow up in 2 months or sooner if needed    Lifestyle Changes for Controlling GERD  When you have GERD, stomach acid feels as if it's backing up toward your mouth. Whether or not you take medication to control your GERD, your symptoms can often be improved with lifestyle changes.   Raise Your Head  Reflux is more likely to strike when you're lying down flat, because stomach fluid can  flow backward more easily. Raising the head of your bed 4-6 inches can help. To do this:  Slide blocks or books under the legs at the head of your bed. Or, place a wedge under  the mattress. Many foam stores can make a suitable wedge for you. The wedge  should run from your waist to the top of your head.  Don't just prop your head on several pillows. This increases pressure on your  stomach. It can make GERD worse.  Watch Your Eating Habits Certain foods may increase the acid in your stomach or relax the lower esophageal sphincter, making GERD more likely. It's best to avoid the following:  Coffee, tea, and carbonated drinks (with and without caffeine)  Fatty, fried, or spicy  food  Mint, chocolate, onions, and tomatoes  Any other foods that seem to irritate your stomach or cause you pain  Relieve the Pressure  Eat smaller meals, even if you have to eat more often.  Don't lie down right after you eat. Wait a few hours for your stomach to empty.  Avoid tight belts and tight-fitting clothes.  Lose excess weight.  Tobacco and Alcohol  Avoid smoking tobacco and drinking alcohol. They can make GERD symptoms worse.    Return in about 4 weeks (around 01/15/2018), or if symptoms worsen or fail to improve.    Thank you for the opportunity to care for this patient.  Please do not hesitate to contact me with questions.  Thermon Leyland, FNP [facility]   _________________________________________________  I have provided oversight concerning [facility]'s evaluation and treatment of this patient's health issues addressed during today's  encounter.  I agree with the assessment and therapeutic plan as outlined in the note.   Signed,   R Jorene Guest, MD

## 2017-12-18 NOTE — Patient Instructions (Addendum)
Asthma - Begin Dulera 100- 2 puffs twice a day with a spacer (samples given) - Continue montelukast 10 mg once a day to prevent cough or wheeze - Continue Proventil inhaler 2 puffs every 4 hours as needed or albuterol via nebulizer 1 unit dose once every 4 hours as needed for cough or wheeze   Allergic rhinitis - Continue with your allergy shots on the current schedule.  - Continue Flonase nasal spray as needed for a stuffy nose - Continue Xyzal once a day as needed for a runny nose.  - Continue allergen avoidance measures.   Reflux - Continue Dexilant 60 mg as ordered to decrease reflux. This may help with wheezing - Continue lifestyle modifications to decrease reflux  Continue the other medications as listed in your chart.  Call me if this plan is not working well for you  Follow up in 2 months or sooner if needed    Lifestyle Changes for Controlling GERD  When you have GERD, stomach acid feels as if it's backing up toward your mouth. Whether or not you take medication to control your GERD, your symptoms can often be improved with lifestyle changes.   Raise Your Head  Reflux is more likely to strike when you're lying down flat, because stomach fluid can  flow backward more easily. Raising the head of your bed 4-6 inches can help. To do this:  Slide blocks or books under the legs at the head of your bed. Or, place a wedge under  the mattress. Many foam stores can make a suitable wedge for you. The wedge  should run from your waist to the top of your head.  Don't just prop your head on several pillows. This increases pressure on your  stomach. It can make GERD worse.  Watch Your Eating Habits Certain foods may increase the acid in your stomach or relax the lower esophageal sphincter, making GERD more likely. It's best to avoid the following:  Coffee, tea, and carbonated drinks (with and without caffeine)  Fatty, fried, or spicy food  Mint, chocolate, onions, and  tomatoes  Any other foods that seem to irritate your stomach or cause you pain  Relieve the Pressure  Eat smaller meals, even if you have to eat more often.  Don't lie down right after you eat. Wait a few hours for your stomach to empty.  Avoid tight belts and tight-fitting clothes.  Lose excess weight.  Tobacco and Alcohol  Avoid smoking tobacco and drinking alcohol. They can make GERD symptoms worse.

## 2017-12-23 ENCOUNTER — Ambulatory Visit (INDEPENDENT_AMBULATORY_CARE_PROVIDER_SITE_OTHER): Payer: Medicare HMO

## 2017-12-23 DIAGNOSIS — J309 Allergic rhinitis, unspecified: Secondary | ICD-10-CM

## 2017-12-29 ENCOUNTER — Ambulatory Visit (INDEPENDENT_AMBULATORY_CARE_PROVIDER_SITE_OTHER): Payer: Medicare HMO

## 2017-12-29 DIAGNOSIS — J309 Allergic rhinitis, unspecified: Secondary | ICD-10-CM

## 2018-01-06 ENCOUNTER — Ambulatory Visit (INDEPENDENT_AMBULATORY_CARE_PROVIDER_SITE_OTHER): Payer: Medicare HMO

## 2018-01-06 DIAGNOSIS — J309 Allergic rhinitis, unspecified: Secondary | ICD-10-CM | POA: Diagnosis not present

## 2018-01-15 ENCOUNTER — Ambulatory Visit: Payer: Medicare HMO | Admitting: Family Medicine

## 2018-01-15 ENCOUNTER — Encounter: Payer: Self-pay | Admitting: Family Medicine

## 2018-01-15 VITALS — BP 127/70 | HR 85 | Temp 97.8°F | Resp 18 | Ht 62.68 in | Wt 211.8 lb

## 2018-01-15 DIAGNOSIS — J309 Allergic rhinitis, unspecified: Secondary | ICD-10-CM

## 2018-01-15 DIAGNOSIS — E1169 Type 2 diabetes mellitus with other specified complication: Secondary | ICD-10-CM

## 2018-01-15 DIAGNOSIS — J4541 Moderate persistent asthma with (acute) exacerbation: Secondary | ICD-10-CM | POA: Diagnosis not present

## 2018-01-15 DIAGNOSIS — Z79899 Other long term (current) drug therapy: Secondary | ICD-10-CM

## 2018-01-15 DIAGNOSIS — K219 Gastro-esophageal reflux disease without esophagitis: Secondary | ICD-10-CM

## 2018-01-15 MED ORDER — FLUTICASONE PROPIONATE 50 MCG/ACT NA SUSP: NASAL | 5 refills | Status: DC

## 2018-01-15 MED ORDER — BUDESONIDE-FORMOTEROL FUMARATE 160-4.5 MCG/ACT IN AERO: 2.0000 | INHALATION_SPRAY | Freq: Two times a day (BID) | RESPIRATORY_TRACT | 5 refills | Status: DC

## 2018-01-15 NOTE — Patient Instructions (Addendum)
Asthma with exacerbation - Stop Dulera. Start Symbicort 160- 2 puffs twice a day with a spacer (samples given). Application for AZ&Me printed and given - Begin prednisone 10 mg tablets. Take 2 tablets twice a day for 4 days, then take 1 tablet on the 5th day, then stop. Continue to check your blood sugars at least daily while taking prednisone - Continue montelukast 10 mg once a day to prevent cough or wheeze - Continue Proventil inhaler 2 puffs every 4 hours as needed or albuterol via nebulizer 1 unit dose once every 4 hours as needed for cough or wheeze - Request for labs over the last year from her primary care provider completed. Will check further labs if necessary  Allergic rhinitis - Continue with your allergy shots on the current schedule.  - Continue Flonase nasal spray as needed for a stuffy nose - Stop Xyzal for the next week while you are taking prednisone. In 1-2 weeks you may continue Xyzal once a day as needed for a runny nose.  - Begin nasal saline rinses at least once a day - Continue allergen avoidance measures.   Reflux - Continue Dexilant 60 mg as ordered to decrease reflux. This may help with wheezing - Continue lifestyle modifications to decrease reflux  Continue the other medications as listed in your chart.  Call me if this plan is not working well for you  Follow up in 1 month or sooner if needed

## 2018-01-15 NOTE — Progress Notes (Addendum)
[address] [place] [id number] Dept: [phone]  FOLLOW UP NOTE  Patient ID: Carolyn Benson, female    DOB: 1955/03/05  Age: 62 y.o. MRN: [id number] Date of Office Visit: 01/15/2018  Assessment  Chief Complaint: Follow-up (Medication check)  HPI Carolyn Benson is a 62 year old female who presents to the clinic for a follow up visit. She was last seen in this clinic on 12/18/2017 for evaluation of asthma, allergic rhinitis, and reflux. At that time, she began Dulera 200 for better control of her asthma. At today's visit, she reports that over the last 2-3 days she has started to experience wheezing, a dry cough, clear nasal drainage, sore throat, and voice hoarseness. She denies fever and sick contacts. She reports she is currently having difficulty affording her asthma medications. She has tried several controller medications including Symbicort, Dulera, and Advair which are reported as not affordable. She reports she prefers to use albuterol because it is inexpensive. Allergic rhinitis is reported as not well controlled with rhinorrhea, increased throat clearing, and post nasal drip. She is not currently taking Xyzal or using Flonase. She began using nasal saline rinses last night. Reflux is reported as well controlled with Dexilant once a day. Her current medications are listed in the chart.   Drug Allergies:  Allergies  Allergen Reactions  . Ibuprofen Swelling  . Ciprofloxacin Rash  . Clarithromycin Nausea And Vomiting  . Codeine Rash    Swelling  . Doxycycline Nausea And Vomiting  . Metoclopramide Rash    PT DOES NOT KNOW  . Omnipaque [Iohexol] Other (See Comments)    Kidneys bleed Kidneys bleed  . Sulfasalazine Rash  . Phenylpropanol Nausea And Vomiting  . Butorphanol Nausea Only  . Butorphanol Tartrate Nausea And Vomiting and Nausea Only  . Darvocet [Propoxyphene N-Acetaminophen] Nausea Only  . Other Rash    Fluoroquinolones  . Ranitidine Hcl Rash  .  Stadol [Butorphanol Tartrate] Nausea Only  . Sulfa Antibiotics Rash    Physical Exam: BP 127/70 (BP Location: Right Arm, Patient Position: Sitting, Cuff Size: Normal)   Pulse 85   Temp 97.8 F (36.6 C) (Oral)   Resp 18   Ht 5' 2.68" (1.592 m)   Wt 211 lb 12.8 oz (96.1 kg)   SpO2 96%   BMI 37.91 kg/m    Physical Exam  Constitutional: She is oriented to person, place, and time. She appears well-developed and well-nourished.  HENT:  Head: Normocephalic.  Right Ear: External ear normal.  Left Ear: External ear normal.  Bilateral nares slightly erythematous and edematous with clear nasal drainage noted.  Pharynx slightly erythematous with no exudate noted.  Ears normal.  Eyes normal.  Eyes: Conjunctivae are normal.  Neck: Normal range of motion. Neck supple.  Cardiovascular: Normal rate, regular rhythm and normal heart sounds.  No murmur noted  Pulmonary/Chest: Effort normal and breath sounds normal.  Bilateral expiratory wheezes noted which improved slightly post bronchodilator therapy  Musculoskeletal: Normal range of motion.  Neurological: She is alert and oriented to person, place, and time.  Skin: Skin is warm and dry.  Psychiatric: She has a normal mood and affect. Her behavior is normal. Judgment and thought content normal.  Vitals reviewed.   Diagnostics: FVC 1.85, FEV1 1.54.  Predicted FVC 3.13, predicted FEV1 2.41.  Spirometry reveals moderate restriction.  Postbronchodilator therapy FVC 1.82, FEV1 1.53.  Postbronchodilator spirometry with no significant bronchodilator response  Assessment and Plan: 1. Moderate persistent asthma with acute exacerbation  2. Allergic rhinitis, unspecified seasonality, unspecified trigger   3. Type 2 diabetes mellitus with other specified complication, unspecified whether long term insulin use (HCC)   4. Gastroesophageal reflux disease without esophagitis   5. Current use of beta blocker     Meds ordered this encounter  Medications    . fluticasone (FLONASE) 50 MCG/ACT nasal spray    Sig: INSTILL 2 SPRAYS IN EACH NOSTRIL EVERY DAY    Dispense:  16 g    Refill:  5  . budesonide-formoterol (SYMBICORT) 160-4.5 MCG/ACT inhaler    Sig: Inhale 2 puffs into the lungs 2 (two) times daily.    Dispense:  1 Inhaler    Refill:  5    On hold, pt will call.    Patient Instructions  Asthma with exacerbation - Start Symbicort 160- 2 puffs twice a day with a spacer (samples given). Application for AZ&Me printed and given - Begin prednisone 10 mg tablets. Take 2 tablets twice a day for 4 days, then take 1 tablet on the 5th day, then stop. Continue to check your blood sugars at least daily while taking prednisone - Continue montelukast 10 mg once a day to prevent cough or wheeze - Continue Proventil inhaler 2 puffs every 4 hours as needed or albuterol via nebulizer 1 unit dose once every 4 hours as needed for cough or wheeze - Request for labs over the last year from her primary care provider completed. Will check further labs if necessary  Allergic rhinitis - Continue with your allergy shots on the current schedule.  - Continue Flonase nasal spray as needed for a stuffy nose - Stop Xyzal for the next week while you are taking prednisone. In 1-2 weeks you may continue Xyzal once a day as needed for a runny nose.  - Begin nasal saline rinses at least once a day - Continue allergen avoidance measures.   Reflux - Continue Dexilant 60 mg as ordered to decrease reflux. This may help with wheezing - Continue lifestyle modifications to decrease reflux  Continue the other medications as listed in your chart.  Call me if this plan is not working well for you  Follow up in 1 month or sooner if needed    Return in about 4 weeks (around 02/12/2018), or if symptoms worsen or fail to improve.    Thank you for the opportunity to care for this patient.  Please do not hesitate to contact me with questions.  Thermon Leyland, FNP [facility]   _________________________________________________  I have provided oversight concerning [facility]'s evaluation and treatment of this patient's health issues addressed during today's encounter.  I agree with the assessment and therapeutic plan as outlined in the note.   Signed,   R Jorene Guest, MD

## 2018-01-30 ENCOUNTER — Ambulatory Visit (INDEPENDENT_AMBULATORY_CARE_PROVIDER_SITE_OTHER): Payer: Medicare HMO | Admitting: *Deleted

## 2018-01-30 DIAGNOSIS — J309 Allergic rhinitis, unspecified: Secondary | ICD-10-CM | POA: Diagnosis not present

## 2018-02-12 ENCOUNTER — Ambulatory Visit: Payer: Medicare HMO | Admitting: Family Medicine

## 2018-02-16 ENCOUNTER — Ambulatory Visit: Payer: Self-pay

## 2018-02-16 ENCOUNTER — Encounter: Payer: Self-pay | Admitting: Family Medicine

## 2018-02-16 ENCOUNTER — Ambulatory Visit: Payer: Medicare HMO | Admitting: Family Medicine

## 2018-02-16 VITALS — BP 120/68 | HR 92 | Temp 97.9°F | Resp 16

## 2018-02-16 DIAGNOSIS — J309 Allergic rhinitis, unspecified: Secondary | ICD-10-CM

## 2018-02-16 DIAGNOSIS — J4540 Moderate persistent asthma, uncomplicated: Secondary | ICD-10-CM | POA: Diagnosis not present

## 2018-02-16 DIAGNOSIS — E1169 Type 2 diabetes mellitus with other specified complication: Secondary | ICD-10-CM | POA: Diagnosis not present

## 2018-02-16 DIAGNOSIS — K219 Gastro-esophageal reflux disease without esophagitis: Secondary | ICD-10-CM

## 2018-02-16 DIAGNOSIS — Z4681 Encounter for fitting and adjustment of insulin pump: Secondary | ICD-10-CM

## 2018-02-16 DIAGNOSIS — Z79899 Other long term (current) drug therapy: Secondary | ICD-10-CM

## 2018-02-16 MED ORDER — BUDESONIDE-FORMOTEROL FUMARATE 160-4.5 MCG/ACT IN AERO: 2.0000 | INHALATION_SPRAY | Freq: Two times a day (BID) | RESPIRATORY_TRACT | 5 refills | Status: DC

## 2018-02-16 NOTE — Progress Notes (Signed)
[address] [place] [id number] Dept: [phone]  FOLLOW UP NOTE  Patient ID: Carolyn Benson, female    DOB: 1955/08/18  Age: 63 y.o. MRN: [id number] Date of Office Visit: 02/16/2018  Assessment  Chief Complaint: Asthma; Cough; and Wheezing  HPI Carolyn Benson is a 63 year old female who presents to the clinic for a follow up visit. She reports her asthma has been moderately well controlled with occasional shortness of breath and cough with clear mucus. She reports using Symbicort 160-2 puffs twice a day with a spacer, montelukast 10 mg once a day and albuterol infrequently. She reports that she has received steroid injections and antibiotics since her last visit to this office. Allergic rhinitis is reported as well controlled with Flonase and Xyzal as needed. She continues allergen immunotherapy with no adverse effects. Reflux is well controlled with Dexilant. Her current medications are listed in the chart. Review of recent labs indicates absolute eosinophil 313 on 01/30/2018.    Drug Allergies:  Allergies  Allergen Reactions  . Ibuprofen Swelling  . Ciprofloxacin Rash  . Clarithromycin Nausea And Vomiting  . Codeine Rash    Swelling  . Doxycycline Nausea And Vomiting  . Metoclopramide Rash    PT DOES NOT KNOW  . Omnipaque [Iohexol] Other (See Comments)    Kidneys bleed Kidneys bleed  . Sulfasalazine Rash  . Phenylpropanol Nausea And Vomiting  . Butorphanol Nausea Only  . Butorphanol Tartrate Nausea And Vomiting and Nausea Only  . Darvocet [Propoxyphene N-Acetaminophen] Nausea Only  . Other Rash    Fluoroquinolones  . Ranitidine Hcl Rash  . Stadol [Butorphanol Tartrate] Nausea Only  . Sulfa Antibiotics Rash    Physical Exam: BP 120/68 (BP Location: Right Arm, Patient Position: Sitting, Cuff Size: Normal)   Pulse 92   Temp 97.9 F (36.6 C) (Oral)   Resp 16   SpO2 95%    Physical Exam Vitals signs reviewed.  Constitutional:      Appearance:  Normal appearance.  HENT:     Head: Normocephalic and atraumatic.     Right Ear: Tympanic membrane normal.     Left Ear: Tympanic membrane normal.     Nose:     Comments: Bilateral nares normal. Pharynx normal. Ears normal. Eyes normal.    Mouth/Throat:     Pharynx: Oropharynx is clear.  Eyes:     Conjunctiva/sclera: Conjunctivae normal.  Neck:     Musculoskeletal: Normal range of motion and neck supple.  Cardiovascular:     Rate and Rhythm: Normal rate and regular rhythm.     Heart sounds: Normal heart sounds. No murmur.  Pulmonary:     Effort: Pulmonary effort is normal.     Breath sounds: Normal breath sounds.     Comments: Lungs clear to auscultation Musculoskeletal: Normal range of motion.  Skin:    General: Skin is warm and dry.  Neurological:     Mental Status: She is alert and oriented to person, place, and time.  Psychiatric:        Mood and Affect: Mood normal.        Behavior: Behavior normal.        Thought Content: Thought content normal.        Judgment: Judgment normal.     Diagnostics: FVC 1.77, FEV1 1.45. Predicted FVC 3.13, predicted FEV1 2.41. Spirometry indicates moderate restriction. This is consistent with previous spirometry.   Assessment and Plan: 1. Moderate persistent asthma without complication   2. Allergic  rhinitis, unspecified seasonality, unspecified trigger   3. Gastroesophageal reflux disease without esophagitis   4. Type 2 diabetes mellitus with other specified complication, unspecified whether long term insulin use (HCC)   5. Insulin pump fitting or adjustment   6. Current use of beta blocker     Meds ordered this encounter  Medications  . budesonide-formoterol (SYMBICORT) 160-4.5 MCG/ACT inhaler    Sig: Inhale 2 puffs into the lungs 2 (two) times daily.    Dispense:  1 Inhaler    Refill:  5    On hold, pt will call.    Patient Instructions  Asthma  - Continue Symbicort 160- 2 puffs twice a day with a spacer (return for  sample). Application for [place]&Me resubmitted. Call [phone] to get an update on your application - Continue montelukast 10 mg once a day to prevent cough or wheeze - Continue Proventil inhaler 2 puffs every 4 hours as needed or albuterol via nebulizer 1 unit dose once every 4 hours as needed for cough or wheeze - We will draw some blood for a lab that will help [place] determine the best treatment plan for your asthma. We will call you as soon as the results are available.   Allergic rhinitis - Continue with your allergy shots on the current schedule.  - Continue Flonase nasal spray as needed for a stuffy nose - Continue Xyzal once a day as needed for a runny nose.  - Begin nasal saline rinses at least once a day - Continue allergen avoidance measures.   Reflux - Continue Dexilant 60 mg as ordered to decrease reflux. This may help with wheezing - Continue lifestyle modifications to decrease reflux  Continue the other medications as listed in your chart.  Call me if this plan is not working well for you  Follow up in 2 months or sooner if needed    Return in about 2 months (around 04/17/2018), or if symptoms worsen or fail to improve.   Thank you for the opportunity to care for this patient.  Please do not hesitate to contact me with questions.  Thermon Leyland, FNP [facility]  I have provided oversight concerning Thermon Leyland' evaluation and treatment of this patient's health issues addressed during today's encounter. I agree with the assessment and therapeutic plan as outlined in the note.   Thank you for the opportunity to care for this patient.  Please do not hesitate to contact me with questions.  Tonette Bihari, M.D.  [facility] [address] [place], [place] [address] [phone]

## 2018-02-16 NOTE — Patient Instructions (Addendum)
Asthma  - Continue Symbicort 160- 2 puffs twice a day with a spacer (return for sample). Application for AZ&Me resubmitted. Call [phone] to get an update on your application - Continue montelukast 10 mg once a day to prevent cough or wheeze - Continue Proventil inhaler 2 puffs every 4 hours as needed or albuterol via nebulizer 1 unit dose once every 4 hours as needed for cough or wheeze - We will draw some blood for a lab that will help [place] determine the best treatment plan for your asthma. We will call you as soon as the results are available.   Allergic rhinitis - Continue with your allergy shots on the current schedule.  - Continue Flonase nasal spray as needed for a stuffy nose - Continue Xyzal once a day as needed for a runny nose.  - Begin nasal saline rinses at least once a day - Continue allergen avoidance measures.   Reflux - Continue Dexilant 60 mg as ordered to decrease reflux. This may help with wheezing - Continue lifestyle modifications to decrease reflux  Continue the other medications as listed in your chart.  Call me if this plan is not working well for you  Follow up in 2 months or sooner if needed

## 2018-02-18 LAB — IGE: IGE (IMMUNOGLOBULIN E), SERUM: 22 [IU]/mL (ref 6–495)

## 2018-02-19 ENCOUNTER — Telehealth: Payer: Self-pay | Admitting: Family Medicine

## 2018-02-19 NOTE — Progress Notes (Signed)
Can you please let this patient know that, based on her last lab tests, we are submitting her paperwork for Fasenra injections for better asthma control and she should hear from Tammy soon. Thank you

## 2018-02-19 NOTE — Telephone Encounter (Signed)
Patient notified that Symbicort sample is available for her to pick up at the front dest of the [facility] office. She reports she will call the 1-800 number provided by AZ&ME patient assistance program to find out her standing with her application for this program.

## 2018-03-02 ENCOUNTER — Other Ambulatory Visit: Payer: Self-pay | Admitting: Neurological Surgery

## 2018-03-02 DIAGNOSIS — M5416 Radiculopathy, lumbar region: Secondary | ICD-10-CM

## 2018-03-03 ENCOUNTER — Ambulatory Visit (INDEPENDENT_AMBULATORY_CARE_PROVIDER_SITE_OTHER): Payer: Medicare HMO

## 2018-03-03 DIAGNOSIS — J309 Allergic rhinitis, unspecified: Secondary | ICD-10-CM

## 2018-03-08 ENCOUNTER — Ambulatory Visit
Admission: RE | Admit: 2018-03-08 | Discharge: 2018-03-08 | Disposition: A | Discharge status: Home / Self Care | Payer: Medicare HMO | Source: Ambulatory Visit | Attending: Neurological Surgery | Admitting: Neurological Surgery

## 2018-03-08 DIAGNOSIS — M5416 Radiculopathy, lumbar region: Secondary | ICD-10-CM

## 2018-03-08 MED ORDER — GADOBENATE DIMEGLUMINE 529 MG/ML IV SOLN
20.0000 mL | Freq: Once | INTRAVENOUS | Status: AC | PRN
  Administered 2018-03-08: 20 mL via INTRAVENOUS

## 2018-03-17 ENCOUNTER — Ambulatory Visit (INDEPENDENT_AMBULATORY_CARE_PROVIDER_SITE_OTHER): Payer: Medicare HMO

## 2018-03-17 DIAGNOSIS — J309 Allergic rhinitis, unspecified: Secondary | ICD-10-CM

## 2018-03-18 ENCOUNTER — Encounter: Payer: Self-pay | Admitting: *Deleted

## 2018-04-01 ENCOUNTER — Telehealth: Payer: Self-pay | Admitting: *Deleted

## 2018-04-01 NOTE — Telephone Encounter (Signed)
Spoke with Carolyn Benson to let her know that her 3 month supply of Symbicort 160 came in the mail today and was put at the front desk for pick up.

## 2018-04-07 ENCOUNTER — Ambulatory Visit (INDEPENDENT_AMBULATORY_CARE_PROVIDER_SITE_OTHER): Payer: Medicare HMO

## 2018-04-07 DIAGNOSIS — J309 Allergic rhinitis, unspecified: Secondary | ICD-10-CM | POA: Diagnosis not present

## 2018-04-21 ENCOUNTER — Ambulatory Visit: Payer: Medicare HMO | Admitting: Family Medicine

## 2018-04-22 ENCOUNTER — Other Ambulatory Visit: Payer: Self-pay

## 2018-04-22 ENCOUNTER — Encounter: Payer: Self-pay | Admitting: Family Medicine

## 2018-04-22 ENCOUNTER — Ambulatory Visit: Payer: Medicare HMO | Admitting: Family Medicine

## 2018-04-22 ENCOUNTER — Ambulatory Visit: Payer: Self-pay

## 2018-04-22 VITALS — BP 106/62 | HR 90 | Temp 98.2°F | Resp 18

## 2018-04-22 DIAGNOSIS — J309 Allergic rhinitis, unspecified: Secondary | ICD-10-CM

## 2018-04-22 DIAGNOSIS — J4540 Moderate persistent asthma, uncomplicated: Secondary | ICD-10-CM

## 2018-04-22 DIAGNOSIS — Z79899 Other long term (current) drug therapy: Secondary | ICD-10-CM | POA: Diagnosis not present

## 2018-04-22 DIAGNOSIS — K219 Gastro-esophageal reflux disease without esophagitis: Secondary | ICD-10-CM

## 2018-04-22 DIAGNOSIS — E1169 Type 2 diabetes mellitus with other specified complication: Secondary | ICD-10-CM

## 2018-04-22 MED ORDER — FLUTICASONE PROPIONATE 50 MCG/ACT NA SUSP: NASAL | 5 refills | Status: DC

## 2018-04-22 NOTE — Progress Notes (Addendum)
[address] [place] [address] Dept: [phone]  FOLLOW UP NOTE  Patient ID: Carolyn Benson, female    DOB: 03-07-1955  Age: 63 y.o. MRN: [id number] Date of Office Visit: 04/22/2018  Assessment  Chief Complaint: Asthma  HPI Carolyn Benson is a 63 year old female who presents to the clinic for a follow up visit. She was last in this clinic on 02/16/2018 by Thermon Leyland, NP for evaluation of asthma, allergic rhinitis on IT, and reflux. She reports her asthma has been moderately well controlled with occasional wheezing and shortness of breath with activity. She continues Symbicort 160-2 puffs twice a day with a spacer, montelukast 10 mg once a day, and albuterol infrequently. She reports allergic rhinitis as moderately well controlled with occasional nasal congestion and sinus headache which resolves within several hours. She is currently taking cetirizine once a day and is out of Flonase at this time. She reports a significant reduction in her symptoms of allergic rhinitis while continuing on allergen immunotherapy. She denies reflux or heartburn and continues Dexilant daily. Her current medications are listed in the chart.    Drug Allergies:  Allergies  Allergen Reactions  . Ibuprofen Swelling  . Ciprofloxacin Rash  . Clarithromycin Nausea And Vomiting  . Codeine Rash    Swelling  . Doxycycline Nausea And Vomiting  . Metoclopramide Rash    PT DOES NOT KNOW  . Omnipaque [Iohexol] Other (See Comments)    Kidneys bleed Kidneys bleed  . Sulfasalazine Rash  . Phenylpropanol Nausea And Vomiting  . Butorphanol Nausea Only  . Butorphanol Tartrate Nausea And Vomiting and Nausea Only  . Darvocet [Propoxyphene N-Acetaminophen] Nausea Only  . Other Rash    Fluoroquinolones  . Ranitidine Hcl Rash  . Stadol [Butorphanol Tartrate] Nausea Only  . Sulfa Antibiotics Rash    Physical Exam: BP 106/62   Pulse 90   Temp 98.2 F (36.8 C) (Oral)   Resp 18   SpO2 97%     Physical Exam Vitals signs reviewed.  Constitutional:      Appearance: Normal appearance.  HENT:     Head: Normocephalic and atraumatic.     Right Ear: Tympanic membrane normal.     Left Ear: Tympanic membrane normal.     Nose: Nose normal.     Mouth/Throat:     Pharynx: Oropharynx is clear.     Comments: Bilateral nares slightly erythematous with clear nasal drainage noted. Pharynx normal. Ears normal. Eyes normal. Eyes:     Conjunctiva/sclera: Conjunctivae normal.  Neck:     Musculoskeletal: Normal range of motion and neck supple.  Cardiovascular:     Rate and Rhythm: Normal rate and regular rhythm.     Heart sounds: Normal heart sounds. No murmur.  Pulmonary:     Effort: Pulmonary effort is normal.     Breath sounds: Normal breath sounds.     Comments: Lungs clear to auscultation Musculoskeletal: Normal range of motion.  Skin:    General: Skin is warm and dry.  Neurological:     Mental Status: She is alert and oriented to person, place, and time.  Psychiatric:        Mood and Affect: Mood normal.        Behavior: Behavior normal.        Thought Content: Thought content normal.        Judgment: Judgment normal.     Diagnostics: FVC 1.77, FEV1 1.45. Predicted FVC 3.13, predicted FEV1 2.41. Spirometry indicates  moderate restriction. This is consistent with previous spirometry readings.   Assessment and Plan: 1. Moderate persistent asthma without complication   2. Allergic rhinitis, unspecified seasonality, unspecified trigger   3. Gastroesophageal reflux disease without esophagitis   4. Current use of beta blocker   5. Type 2 diabetes mellitus with other specified complication, unspecified whether long term insulin use (HCC)     Meds ordered this encounter  Medications  . fluticasone (FLONASE) 50 MCG/ACT nasal spray    Sig: INSTILL 2 SPRAYS IN EACH NOSTRIL EVERY DAY    Dispense:  16 g    Refill:  5    Patient Instructions  Asthma  - Continue Symbicort 160-  2 puffs twice a day with a spacer to prevent cough and wheeze - Continue montelukast 10 mg once a day to prevent cough or wheeze - Continue Proventil inhaler 2 puffs every 4 hours as needed or albuterol via nebulizer 1 unit dose once every 4 hours as needed for cough or wheeze - You have been approved for benralizumab injections which will help you manage your asthma. We will contact you when this arrives at our office  Allergic rhinitis - Continue with your allergy shots on the current schedule.  - Continue Flonase nasal spray as needed for a stuffy nose - Continue Xyzal once a day as needed for a runny nose.  - Begin nasal saline rinses at least once a day - Continue allergen avoidance measures.   Reflux - Continue Dexilant 60 mg as ordered to decrease reflux. This may help with wheezing - Continue lifestyle modifications to decrease reflux  Continue the other medications as listed in your chart.  Call me if this plan is not working well for you  Follow up in 4 months or sooner if needed    Return in about 4 months (around 08/22/2018), or if symptoms worsen or fail to improve.    Thank you for the opportunity to care for this patient.  Please do not hesitate to contact me with questions.  Thermon Leyland, FNP [facility]  _________________________________________________  I have provided oversight concerning [facility]'s evaluation and treatment of this patient's health issues addressed during today's encounter.  I agree with the assessment and therapeutic plan as outlined in the note.   Signed,   R Jorene Guest, MD

## 2018-04-22 NOTE — Patient Instructions (Signed)
Asthma  - Continue Symbicort 160- 2 puffs twice a day with a spacer to prevent cough and wheeze - Continue montelukast 10 mg once a day to prevent cough or wheeze - Continue Proventil inhaler 2 puffs every 4 hours as needed or albuterol via nebulizer 1 unit dose once every 4 hours as needed for cough or wheeze - You have been approved for benralizumab injections which will help you manage your asthma. We will contact you when this arrives at our office  Allergic rhinitis - Continue with your allergy shots on the current schedule.  - Continue Flonase nasal spray as needed for a stuffy nose - Continue Xyzal once a day as needed for a runny nose.  - Begin nasal saline rinses at least once a day - Continue allergen avoidance measures.   Reflux - Continue Dexilant 60 mg as ordered to decrease reflux. This may help with wheezing - Continue lifestyle modifications to decrease reflux  Continue the other medications as listed in your chart.  Call me if this plan is not working well for you  Follow up in 4 months or sooner if needed

## 2018-04-30 ENCOUNTER — Ambulatory Visit (INDEPENDENT_AMBULATORY_CARE_PROVIDER_SITE_OTHER): Payer: Medicare HMO

## 2018-04-30 DIAGNOSIS — J309 Allergic rhinitis, unspecified: Secondary | ICD-10-CM | POA: Diagnosis not present

## 2018-05-05 ENCOUNTER — Ambulatory Visit (INDEPENDENT_AMBULATORY_CARE_PROVIDER_SITE_OTHER): Payer: Medicare HMO | Admitting: *Deleted

## 2018-05-05 DIAGNOSIS — J309 Allergic rhinitis, unspecified: Secondary | ICD-10-CM

## 2018-05-11 ENCOUNTER — Ambulatory Visit (INDEPENDENT_AMBULATORY_CARE_PROVIDER_SITE_OTHER): Payer: Medicare HMO

## 2018-05-11 DIAGNOSIS — J309 Allergic rhinitis, unspecified: Secondary | ICD-10-CM

## 2018-05-19 ENCOUNTER — Ambulatory Visit (INDEPENDENT_AMBULATORY_CARE_PROVIDER_SITE_OTHER): Payer: Medicare HMO

## 2018-05-19 DIAGNOSIS — J309 Allergic rhinitis, unspecified: Secondary | ICD-10-CM

## 2018-05-25 NOTE — Progress Notes (Signed)
EXP 05/26/19 

## 2018-05-26 DIAGNOSIS — J3089 Other allergic rhinitis: Secondary | ICD-10-CM

## 2018-05-27 ENCOUNTER — Telehealth: Payer: Self-pay | Admitting: *Deleted

## 2018-05-27 NOTE — Telephone Encounter (Signed)
Called clinic to see if patient's [employer] Ch[employer]enger had been delivered and I had not. I had already called [employer] [place] a total of 3 times to set up shipment and it still has not shipped. When I called today they advised it was shipped and returned to pharmacy.  Turns out that it was shipped to patient by mistake and cannot ship to patient. I reordered today.

## 2018-06-02 ENCOUNTER — Ambulatory Visit (INDEPENDENT_AMBULATORY_CARE_PROVIDER_SITE_OTHER): Payer: Medicare HMO

## 2018-06-02 ENCOUNTER — Other Ambulatory Visit: Payer: Self-pay

## 2018-06-02 DIAGNOSIS — J4540 Moderate persistent asthma, uncomplicated: Secondary | ICD-10-CM

## 2018-06-02 DIAGNOSIS — J4550 Severe persistent asthma, uncomplicated: Secondary | ICD-10-CM

## 2018-06-02 MED ORDER — BENRALIZUMAB 30 MG/ML ~~LOC~~ SOSY
30.0000 mg | PREFILLED_SYRINGE | Freq: Once | SUBCUTANEOUS | Status: AC
  Administered 2018-07-02: 11:00:00 30 mg via SUBCUTANEOUS

## 2018-06-09 ENCOUNTER — Ambulatory Visit (INDEPENDENT_AMBULATORY_CARE_PROVIDER_SITE_OTHER): Payer: Medicare HMO

## 2018-06-09 DIAGNOSIS — J309 Allergic rhinitis, unspecified: Secondary | ICD-10-CM

## 2018-06-16 ENCOUNTER — Ambulatory Visit (INDEPENDENT_AMBULATORY_CARE_PROVIDER_SITE_OTHER): Payer: Medicare HMO

## 2018-06-16 DIAGNOSIS — J309 Allergic rhinitis, unspecified: Secondary | ICD-10-CM | POA: Diagnosis not present

## 2018-06-30 ENCOUNTER — Ambulatory Visit (INDEPENDENT_AMBULATORY_CARE_PROVIDER_SITE_OTHER): Payer: Medicare HMO

## 2018-06-30 DIAGNOSIS — J309 Allergic rhinitis, unspecified: Secondary | ICD-10-CM | POA: Diagnosis not present

## 2018-07-02 ENCOUNTER — Other Ambulatory Visit: Payer: Self-pay

## 2018-07-02 ENCOUNTER — Ambulatory Visit (INDEPENDENT_AMBULATORY_CARE_PROVIDER_SITE_OTHER): Payer: Medicare HMO

## 2018-07-02 DIAGNOSIS — J4550 Severe persistent asthma, uncomplicated: Secondary | ICD-10-CM | POA: Diagnosis not present

## 2018-07-02 DIAGNOSIS — J4540 Moderate persistent asthma, uncomplicated: Secondary | ICD-10-CM

## 2018-07-07 ENCOUNTER — Ambulatory Visit (INDEPENDENT_AMBULATORY_CARE_PROVIDER_SITE_OTHER): Payer: Medicare HMO

## 2018-07-07 DIAGNOSIS — J309 Allergic rhinitis, unspecified: Secondary | ICD-10-CM | POA: Diagnosis not present

## 2018-07-15 ENCOUNTER — Ambulatory Visit (INDEPENDENT_AMBULATORY_CARE_PROVIDER_SITE_OTHER): Payer: Medicare HMO

## 2018-07-15 ENCOUNTER — Other Ambulatory Visit: Payer: Self-pay | Admitting: *Deleted

## 2018-07-15 ENCOUNTER — Other Ambulatory Visit: Payer: Self-pay | Admitting: Neurological Surgery

## 2018-07-15 DIAGNOSIS — J309 Allergic rhinitis, unspecified: Secondary | ICD-10-CM | POA: Diagnosis not present

## 2018-07-24 ENCOUNTER — Ambulatory Visit (INDEPENDENT_AMBULATORY_CARE_PROVIDER_SITE_OTHER): Payer: Medicare HMO

## 2018-07-24 DIAGNOSIS — J309 Allergic rhinitis, unspecified: Secondary | ICD-10-CM

## 2018-07-30 ENCOUNTER — Ambulatory Visit: Payer: Self-pay

## 2018-08-05 ENCOUNTER — Ambulatory Visit (INDEPENDENT_AMBULATORY_CARE_PROVIDER_SITE_OTHER): Payer: Medicare HMO

## 2018-08-05 ENCOUNTER — Other Ambulatory Visit: Payer: Self-pay

## 2018-08-05 DIAGNOSIS — J4550 Severe persistent asthma, uncomplicated: Secondary | ICD-10-CM

## 2018-08-05 DIAGNOSIS — J4540 Moderate persistent asthma, uncomplicated: Secondary | ICD-10-CM

## 2018-08-05 MED ORDER — BENRALIZUMAB 30 MG/ML ~~LOC~~ SOSY: 30.0000 mg | PREFILLED_SYRINGE | Freq: Once | SUBCUTANEOUS | Status: DC

## 2018-08-12 ENCOUNTER — Ambulatory Visit (INDEPENDENT_AMBULATORY_CARE_PROVIDER_SITE_OTHER): Payer: Medicare HMO | Admitting: *Deleted

## 2018-08-12 DIAGNOSIS — J309 Allergic rhinitis, unspecified: Secondary | ICD-10-CM

## 2018-08-24 ENCOUNTER — Encounter: Payer: Self-pay | Admitting: Family Medicine

## 2018-08-24 ENCOUNTER — Ambulatory Visit (INDEPENDENT_AMBULATORY_CARE_PROVIDER_SITE_OTHER): Payer: Medicare HMO | Admitting: Family Medicine

## 2018-08-24 ENCOUNTER — Other Ambulatory Visit: Payer: Self-pay

## 2018-08-24 VITALS — BP 116/66 | HR 88 | Temp 98.8°F | Resp 16

## 2018-08-24 DIAGNOSIS — J4540 Moderate persistent asthma, uncomplicated: Secondary | ICD-10-CM | POA: Diagnosis not present

## 2018-08-24 DIAGNOSIS — Z79899 Other long term (current) drug therapy: Secondary | ICD-10-CM

## 2018-08-24 DIAGNOSIS — E1169 Type 2 diabetes mellitus with other specified complication: Secondary | ICD-10-CM | POA: Diagnosis not present

## 2018-08-24 DIAGNOSIS — K219 Gastro-esophageal reflux disease without esophagitis: Secondary | ICD-10-CM | POA: Diagnosis not present

## 2018-08-24 DIAGNOSIS — J309 Allergic rhinitis, unspecified: Secondary | ICD-10-CM

## 2018-08-24 NOTE — Progress Notes (Signed)
[address] [place] [id number] Dept: [phone]  FOLLOW UP NOTE  Patient ID: Carolyn Benson, female    DOB: 18-Jun-1955  Age: 63 y.o. MRN: [id number] Date of Office Visit: 08/24/2018  Assessment  Chief Complaint: Asthma  HPI Carolyn HissVictoria B Benson is a 63 year old female who presents to the clinic for a follow up visit. She reports her asthma has been well controlled with no shortness of breath or cough with acticity or rest. She reports occasional wheeze increases with activity. She continues Symbicort 160-2 puffs twice a day, montelukast 10 mg once a day and as not needed her albuterol since her last visit to this clinic. She continues Fasenra injections once every 8 weeks and reports a significant improvement in her breathing while continuing Fasenra injections. Allergic rhinitis is reported as well controlled with occasional clear rhinorrhea. She is using Flonase as needed and is not using an antihistamine at this time. She reports a significant reduction of her allergic rhinitis while continuing allergen immunotherapy. Reflux is reported as well controlled with Dexilant 60 mg once a day in conjunction with dietary and lifestyle modifications. Her current medications are listed in the chart.    Drug Allergies:  Allergies  Allergen Reactions  . Ibuprofen Swelling  . Ciprofloxacin Rash  . Clarithromycin Nausea And Vomiting  . Codeine Rash    Swelling  . Doxycycline Nausea And Vomiting  . Metoclopramide Rash    PT DOES NOT KNOW  . Omnipaque [Iohexol] Other (See Comments)    Kidneys bleed Kidneys bleed  . Sulfasalazine Rash  . Phenylpropanol Nausea And Vomiting  . Butorphanol Nausea Only  . Butorphanol Tartrate Nausea And Vomiting and Nausea Only  . Darvocet [Propoxyphene N-Acetaminophen] Nausea Only  . Other Rash    Fluoroquinolones  . Ranitidine Hcl Rash  . Stadol [Butorphanol Tartrate] Nausea Only  . Sulfa Antibiotics Rash    Physical Exam: BP 116/66   Pulse  88   Temp 98.8 F (37.1 C) (Oral)   Resp 16   SpO2 97%    Physical Exam Vitals signs reviewed.  Constitutional:      Appearance: Normal appearance.  HENT:     Head: Normocephalic and atraumatic.     Right Ear: Tympanic membrane normal.     Left Ear: Tympanic membrane normal.     Nose:     Comments: Bilateral nares slightly erythematous with clear nasal drainage noted. Pharynx normal. Ears normal. Eyes normal.    Mouth/Throat:     Pharynx: Oropharynx is clear.  Eyes:     Conjunctiva/sclera: Conjunctivae normal.  Neck:     Musculoskeletal: Normal range of motion and neck supple.  Cardiovascular:     Rate and Rhythm: Normal rate and regular rhythm.     Heart sounds: Normal heart sounds. No murmur.  Pulmonary:     Effort: Pulmonary effort is normal.     Breath sounds: Normal breath sounds.     Comments: Lungs clear to auscultation Musculoskeletal: Normal range of motion.  Skin:    General: Skin is warm and dry.  Neurological:     Mental Status: She is alert and oriented to person, place, and time.  Psychiatric:        Mood and Affect: Mood normal.        Behavior: Behavior normal.        Thought Content: Thought content normal.        Judgment: Judgment normal.     Diagnostics: FVC 1.72, FEV1 1.42.  Predicted FVC 3.10, predicted FEV1 2.38. Spirometry indicates moderate restriction. This is consistent with previous spirometry readings.   Assessment and Plan: 1. Moderate persistent asthma without complication   2. Allergic rhinitis, unspecified seasonality, unspecified trigger   3. Gastroesophageal reflux disease without esophagitis   4. Type 2 diabetes mellitus with other specified complication, unspecified whether long term insulin use ([place])   5. Current use of beta blocker     Patient Instructions  Asthma  - Continue Symbicort 160- 2 puffs twice a day with a spacer to prevent cough and wheeze - Continue montelukast 10 mg once a day to prevent cough or wheeze -  Continue Proventil inhaler 2 puffs every 4 hours as needed or albuterol via nebulizer 1 unit dose once every 4 hours as needed for cough or wheeze - Continue Fasenra 30 mg injection once every 8 weeks  Allergic rhinitis - Continue with your allergy injections on the current schedule  - Continue Flonase nasal spray as needed for a stuffy nose - Continue Xyzal once a day as needed for a runny nose.  - Begin nasal saline rinses as needed for nasal symptoms - Continue allergen avoidance measures.   Reflux - Continue Dexilant 60 mg as ordered to decrease reflux. This may help with wheezing - Continue lifestyle modifications to decrease reflux as listed below  Continue the other medications as listed in your chart.  Call me if this plan is not working well for you  Follow up in 5 months or sooner if needed   Return in about 5 months (around 01/24/2019), or if symptoms worsen or fail to improve.   Thank you for the opportunity to care for this patient.  Please do not hesitate to contact me with questions.  Gareth Morgan, FNP [facility] [place]  I have provided oversight concerning Gareth Morgan' evaluation and treatment of this patient's health issues addressed during today's encounter. I agree with the assessment and therapeutic plan as outlined in the note.   Thank you for the opportunity to care for this patient.  Please do not hesitate to contact me with questions.  Penne Lash, M.D.  [facility] [address] [place], [place] [address] [phone]

## 2018-08-24 NOTE — Patient Instructions (Signed)
Asthma  - Continue Symbicort 160- 2 puffs twice a day with a spacer to prevent cough and wheeze - Continue montelukast 10 mg once a day to prevent cough or wheeze - Continue Proventil inhaler 2 puffs every 4 hours as needed or albuterol via nebulizer 1 unit dose once every 4 hours as needed for cough or wheeze - Continue Fasenra 30 mg injection once every 8 weeks  Allergic rhinitis - Continue with your allergy injections on the current schedule  - Continue Flonase nasal spray as needed for a stuffy nose - Continue Xyzal once a day as needed for a runny nose.  - Begin nasal saline rinses as needed for nasal symptoms - Continue allergen avoidance measures.   Reflux - Continue Dexilant 60 mg as ordered to decrease reflux. This may help with wheezing - Continue lifestyle modifications to decrease reflux as listed below  Continue the other medications as listed in your chart.  Call me if this plan is not working well for you  Follow up in 5 months or sooner if needed    Lifestyle Changes for Controlling GERD When you have GERD, stomach acid feels as if it's backing up toward your mouth. Whether or not you take medication to control your GERD, your symptoms can often be improved with lifestyle changes.   Raise Your Head  Reflux is more likely to strike when you're lying down flat, because stomach fluid can  flow backward more easily. Raising the head of your bed 4-6 inches can help. To do this:  Slide blocks or books under the legs at the head of your bed. Or, place a wedge under  the mattress. Many foam stores can make a suitable wedge for you. The wedge  should run from your waist to the top of your head.  Don't just prop your head on several pillows. This increases pressure on your  stomach. It can make GERD worse.  Watch Your Eating Habits Certain foods may increase the acid in your stomach or relax the lower esophageal sphincter, making GERD more likely. It's best to  avoid the following:  Coffee, tea, and carbonated drinks (with and without caffeine)  Fatty, fried, or spicy food  Mint, chocolate, onions, and tomatoes  Any other foods that seem to irritate your stomach or cause you pain  Relieve the Pressure  Eat smaller meals, even if you have to eat more often.  Don't lie down right after you eat. Wait a few hours for your stomach to empty.  Avoid tight belts and tight-fitting clothes.  Lose excess weight.  Tobacco and Alcohol  Avoid smoking tobacco and drinking alcohol. They can make GERD symptoms worse.

## 2018-08-31 ENCOUNTER — Other Ambulatory Visit: Payer: Self-pay | Admitting: Family Medicine

## 2018-08-31 ENCOUNTER — Telehealth (HOSPITAL_COMMUNITY): Payer: Self-pay | Admitting: Rehabilitation

## 2018-08-31 NOTE — Telephone Encounter (Signed)

## 2018-09-01 ENCOUNTER — Encounter: Payer: Self-pay | Admitting: Vascular Surgery

## 2018-09-01 ENCOUNTER — Other Ambulatory Visit: Payer: Self-pay

## 2018-09-01 ENCOUNTER — Ambulatory Visit (INDEPENDENT_AMBULATORY_CARE_PROVIDER_SITE_OTHER): Payer: Medicare HMO | Admitting: Vascular Surgery

## 2018-09-01 VITALS — BP 123/75 | HR 90 | Temp 97.8°F | Resp 20 | Ht 62.0 in | Wt 224.8 lb

## 2018-09-01 DIAGNOSIS — M5137 Other intervertebral disc degeneration, lumbosacral region: Secondary | ICD-10-CM | POA: Diagnosis not present

## 2018-09-01 NOTE — Progress Notes (Signed)
[facility] [place]  Patient name: Carolyn Benson MRN: [id number] DOB: Nov 10, 1955 Sex: female  REASON FOR CONSULT: Discuss anterior exposure for L4-5 and L5 disc surgery  HPI: Carolyn Benson is a 63 y.o. female, who is here today for discussion of anterior exposure for lumbar fusion with Dr. Barnett AbuHenry Elsner.  She has had progressive pain in her back and also in her right leg which has been progressive.  She has failed conservative therapy is been recommended that she go to level fusion.  She is here today for discussion of my role for anterior exposure.  She has had prior intra-abdominal surgery.  She has and what sounds like a vagotomy and pyloroplasty when she was 63 years old for ulcer disease.  Has had chronic dumping syndrome her whole adult life since then.  She has a long upper midline incision.  She also has had prior pelvic GYN surgery with low transverse incision.  Has had laparoscopic cholecystectomy.  She has no history of cardiac disease.  No history of peripheral vascular occlusive disease.  Past Medical History:  Diagnosis Date  . Anxiety   . Asthma   . Back pain   . Depression   . Diabetes mellitus   . GERD (gastroesophageal reflux disease)   . Hypertension   . Hypokalemia   . Lumbar spondylosis    and degenerative disc disease causing mild impingement at L4-5 and L5-S1.  . Weakness of back    and legs    Family History  Problem Relation Age of Onset  . Asthma Mother   . Allergic rhinitis Father   . Asthma Brother   . Angioedema Neg Hx   . Atopy Neg Hx   . Eczema Neg Hx   . Immunodeficiency Neg Hx     SOCIAL HISTORY: Social History   Socioeconomic History  . Marital status: Widowed    Spouse name: Not on file  . Number of children: Not on file  . Years of education: Not on file  . Highest education level: Not on file  Occupational History  . Not on file  Social Needs  . Financial resource  strain: Not on file  . Food insecurity    Worry: Not on file    Inability: Not on file  . Transportation needs    Medical: Not on file    Non-medical: Not on file  Tobacco Use  . Smoking status: Never Smoker  . Smokeless tobacco: Never Used  Substance and Sexual Activity  . Alcohol use: No  . Drug use: No  . Sexual activity: Never  Lifestyle  . Physical activity    Days per week: Not on file    Minutes per session: Not on file  . Stress: Not on file  Relationships  . Social Musicianconnections    Talks on phone: Not on file    Gets together: Not on file    Attends religious service: Not on file    Active member of club or organization: Not on file    Attends meetings of clubs or organizations: Not on file    Relationship status: Not on file  . Intimate partner violence    Fear of current or ex partner: Not on file    Emotionally abused: Not on file    Physically abused: Not on file    Forced sexual activity: Not on file  Other Topics Concern  . Not on file  Social History Narrative  .  Not on file    Allergies  Allergen Reactions  . Ibuprofen Swelling  . Ciprofloxacin Rash  . Clarithromycin Nausea And Vomiting  . Codeine Rash    Swelling  . Doxycycline Nausea And Vomiting  . Metoclopramide Rash    PT DOES NOT KNOW  . Omnipaque [Iohexol] Other (See Comments)    Kidneys bleed Kidneys bleed  . Sulfasalazine Rash  . Phenylpropanol Nausea And Vomiting  . Butorphanol Nausea Only  . Butorphanol Tartrate Nausea And Vomiting and Nausea Only  . Darvocet [Propoxyphene N-Acetaminophen] Nausea Only  . Other Rash    Fluoroquinolones  . Ranitidine Hcl Rash  . Stadol [Butorphanol Tartrate] Nausea Only  . Sulfa Antibiotics Rash    Current Outpatient Medications  Medication Sig Dispense Refill  . acyclovir (ZOVIRAX) 400 MG tablet Take 400 mg by mouth 2 (two) times daily.      Marland Kitchen. albuterol (PROVENTIL HFA;VENTOLIN HFA) 108 (90 Base) MCG/ACT inhaler Inhale 2 puffs into the lungs  every 4 (four) hours as needed for wheezing or shortness of breath. 3 Inhaler 0  . albuterol (PROVENTIL) (2.5 MG/3ML) 0.083% nebulizer solution Take 3 mLs (2.5 mg total) by nebulization every 4 (four) hours as needed for wheezing or shortness of breath. 75 mL 1  . atenolol (TENORMIN) 25 MG tablet Take 25 mg by mouth daily.      . budesonide-formoterol (SYMBICORT) 160-4.5 MCG/ACT inhaler Inhale 2 puffs into the lungs 2 (two) times daily. 1 Inhaler 5  . busPIRone (BUSPAR) 10 MG tablet Take 20 mg by mouth 2 (two) times daily.    Marland Kitchen. dexlansoprazole (DEXILANT) 60 MG capsule Take 60 mg by mouth daily.      . Diethylpropion HCl CR 75 MG TB24 Take by mouth.    . doxepin (SINEQUAN) 25 MG capsule Take 25 mg by mouth at bedtime.      Marland Kitchen. EPINEPHrine (EPIPEN 2-PAK) 0.3 mg/0.3 mL IJ SOAJ injection Inject 0.3 mLs (0.3 mg total) into the muscle once. 2 Device 1  . escitalopram (LEXAPRO) 10 MG tablet Take 1 a day    . estrogens-methylTEST (ESTRATEST) 1.25-2.5 MG tablet TAKE 1 TABLET BY MOUTH EVERY DAY    . fluticasone (FLONASE) 50 MCG/ACT nasal spray USE 2 SPRAYS IN EACH NOSTRIL EVERY DAY 48 g 2  . gabapentin (NEURONTIN) 600 MG tablet Take 1,200 mg by mouth 2 (two) times daily.      Marland Kitchen. glucose blood (CONTOUR NEXT TEST) test strip by Misc.(Non-Drug; Combo Route) route 4 times daily.    Marland Kitchen. guaifenesin (HUMIBID E) 400 MG TABS Take 400 mg by mouth 2 (two) times daily.      Marland Kitchen. HYDROcodone-acetaminophen (NORCO) 10-325 MG tablet TAKE 1 TABLET 4 TIMES A DAY AS NEEDED FOR PAIN(TAKE WITH FOOD/SNACK)    . insulin aspart (NOVOLOG) 100 UNIT/ML injection Use as directed in insulin pump.  MDD: 135 units/day    . Insulin Infusion Pump DEVI 80 Units by Insulin Pump route once a week.    . levocetirizine (XYZAL) 5 MG tablet Take 5 mg by mouth every evening.     Marland Kitchen. levothyroxine (SYNTHROID, LEVOTHROID) 50 MCG tablet Take 50 mcg by mouth daily before breakfast.     . linaclotide (LINZESS) 290 MCG CAPS capsule Take by mouth.    Marland Kitchen.  LORazepam (ATIVAN) 0.5 MG tablet Take 1 or 2 at night as needed    . lubiprostone (AMITIZA) 8 MCG capsule Take 8 mcg by mouth 2 (two) times daily with a meal.      .  metolazone (ZAROXOLYN) 2.5 MG tablet Take 2.5 mg by mouth 2 (two) times daily.      . metoprolol succinate (TOPROL-XL) 25 MG 24 hr tablet     . montelukast (SINGULAIR) 10 MG tablet Take 10 mg by mouth daily.      . potassium chloride (KLOR-CON) 20 MEQ packet Take by mouth.    . pregabalin (LYRICA) 150 MG capsule TAKE 1 (ONE) CAPSULE THREE TIMES DAILY  3  . promethazine (PHENERGAN) 25 MG tablet Take 25 mg by mouth 3 (three) times daily as needed.  0  . QUEtiapine (SEROQUEL) 100 MG tablet Take 100 mg by mouth at bedtime.    . rosuvastatin (CRESTOR) 10 MG tablet Take 10 mg by mouth.    . sertraline (ZOLOFT) 50 MG tablet Take 50 mg by mouth.    . topiramate (TOPAMAX) 25 MG tablet Take 25 mg by mouth daily.      Current Facility-Administered Medications  Medication Dose Route Frequency Provider Last Rate Last Dose  . Benralizumab SOSY 30 mg  30 mg Subcutaneous Once Ambs, Norvel RichardsAnne M, FNP      . predniSONE (DELTASONE) tablet 10 mg  10 mg Oral Q breakfast Bobbitt, Heywood Ilesalph Carter, MD        REVIEW OF SYSTEMS:  [X]  denotes positive finding, [ ]  denotes negative finding Cardiac  Comments:  Chest pain or chest pressure:    Shortness of breath upon exertion:    Short of breath when lying flat:    Irregular heart rhythm:        Vascular    Pain in calf, thigh, or hip brought on by ambulation: x  neurogenic  Pain in feet at night that wakes you up from your sleep:     Blood clot in your veins:    Leg swelling:         Pulmonary    Oxygen at home:    Productive cough:     Wheezing:         Neurologic    Sudden weakness in arms or legs:     Sudden numbness in arms or legs:     Sudden onset of difficulty speaking or slurred speech:    Temporary loss of vision in one eye:     Problems with dizziness:         Gastrointestinal     Blood in stool:     Vomited blood:         Genitourinary    Burning when urinating:     Blood in urine:        Psychiatric    Major depression:         Hematologic    Bleeding problems:    Problems with blood clotting too easily:        Skin    Rashes or ulcers:        Constitutional    Fever or chills:      PHYSICAL EXAM: Vitals:   09/01/18 1104  BP: 123/75  Pulse: 90  Resp: 20  Temp: 97.8 F (36.6 C)  TempSrc: Temporal  SpO2: 97%  Weight: 224 lb 12.8 oz (102 kg)  Height: 5\' 2"  (1.575 m)    GENERAL: The patient is a well-nourished female, in no acute distress. The vital signs are documented above. CARDIOVASCULAR: Palpable radial and palpable dorsalis pedis pulses bilaterally PULMONARY: There is good air exchange  ABDOMEN: Soft and non-tender  MUSCULOSKELETAL: There are no major deformities or cyanosis. NEUROLOGIC: No focal  weakness or paresthesias are detected. SKIN: There are no ulcers or rashes noted. PSYCHIATRIC: The patient has a normal affect.  DATA:  Lumbar films from January 2020 show no evidence of atherosclerotic calcification.  CT scan from 2014 also shows no evidence of aortic calcification  MEDICAL ISSUES: Had a very long discussion with the patient regarding my role in exposure.  Explained mobilization of intraperitoneal contents, left ureter, arterial and venous structures overlying the spine and potential injury to all of these particularly discussed venous injury.  I do not feel that she is at a prohibitive risk.  She is moderately obese with prior intra-abdominal surgery.  We will proceed as planned with 2 level spine exposure on 09/07/2018   Rosetta Posner, MD [facility] Tel [phone] Pager [phone]

## 2018-09-02 NOTE — Progress Notes (Addendum)
[id number] - [place], [place] - [address] AT [place], [address] [place] [address] Phone: [phone] Fax: [phone]  [id number] - [address], [place] [address] Phone: [phone] Fax: [facility] - [place], [place] [address] Phone: [phone] Fax: [phone]      Your procedure is scheduled on July 27  Report to [facility] Main Entrance "A" at 0530 A.M., and check in at the Admitting office.  Call this number if you have problems the morning of surgery:  [phone]  Call [phone] if you have any questions prior to your surgery date Monday-Friday 8am-4pm    Remember:  Do not eat or drink after midnight the night before your surgery   Take these medicines the morning of surgery with A SIP OF WATER  acyclovir (ZOVIRAX) albuterol (PROVENTIL HFA;VENTOLIN HFA)  Please bring all inhalers with you the day of surgery.  albuterol (PROVENTIL) (2.5 MG/3ML) atenolol (TENORMIN) budesonide-formoterol (SYMBICORT) busPIRone (BUSPAR) dexlansoprazole (DEXILANT) escitalopram (LEXAPRO) estrogens-methylTEST (ESTRATEST) fluticasone (FLONASE) gabapentin (NEURONTIN)  HYDROcodone-acetaminophen (NORCO) if needed for pain levothyroxine (SYNTHROID, LEVOTHROID) linaclotide (LINZESS) LORazepam (ATIVAN) if needed metoprolol succinate (TOPROL-XL)  montelukast (SINGULAIR)  pregabalin (LYRICA) rosuvastatin (CRESTOR) sertraline (ZOLOFT) topiramate (TOPAMAX)  Patients with Insulin Pumps    . For patients with Insulin Pumps: o Contact your diabetes doctor for specific instructions before surgery. o Decrease basal insulin rates by 20% at midnight the night before surgery. o Note that if your surgery is planned to be longer than 2 hours, your insulin pump will be removed  and intravenous (IV) insulin will be started and managed by the nurses and anesthesiologist. You will be able to restart your insulin pump once you are awake and able to manage it. o Make sure to bring insulin pump supplies to the hospital with you in case your site needs to be changed.  How to Manage Your Diabetes Before and After Surgery  Why is it important to control my blood sugar before and after surgery? . Improving blood sugar levels before and after surgery helps healing and can limit problems. . A way of improving blood sugar control is eating a healthy diet by: o  Eating less sugar and carbohydrates o  Increasing activity/exercise o  Talking with your doctor about reaching your blood sugar goals . High blood sugars (greater than 180 mg/dL) can raise your risk of infections and slow your recovery, so you will need to focus on controlling your diabetes during the weeks before surgery. . Make sure that the doctor who takes care of your diabetes knows about your planned surgery including the date and location.  How do I manage my blood sugar before surgery? . Check your blood sugar at least 4 times a day, starting 2 days before surgery, to make sure that the level is not too high or low. o Check your blood sugar the morning of your surgery when you wake up and every 2 hours until you get to the Short Stay unit. . If your blood sugar is less than 70 mg/dL, you will need to treat for low blood sugar: o Do not take insulin. o Treat a low blood sugar (less than 70 mg/dL) with  cup of clear juice (cranberry or apple), 4 glucose tablets, OR glucose gel. o Recheck blood sugar in 15 minutes after treatment (to  make sure it is greater than 70 mg/dL). If your blood sugar is not greater than 70 mg/dL on recheck, call [phone] for further instructions. . Report your blood sugar to the short stay nurse when you get to Short Stay.  . If you are admitted to the hospital after surgery: o Your  blood sugar will be checked by the staff and you will probably be given insulin after surgery (instead of oral diabetes medicines) to make sure you have good blood sugar levels. o The goal for blood sugar control after surgery is 80-180 mg/dL  7 days prior to surgery STOP taking any Aspirin (unless otherwise instructed by your surgeon), Aleve, Naproxen, Ibuprofen, Motrin, Advil, Goody's, BC's, all herbal medications, fish oil, and all vitamins.    The Morning of Surgery  Do not wear jewelry, make-up or nail polish.  Do not wear lotions, powders, or perfumes/colognes, or deodorant  Do not shave 48 hours prior to surgery.  Do not bring valuables to the hospital.  [facility] any belongings or valuables.  If you are a smoker, DO NOT Smoke 24 hours prior to surgery IF you wear a CPAP at night please bring your mask, tubing, and machine the morning of surgery   Remember that you must have someone to transport you home after your surgery, and remain with you for 24 hours if you are discharged the same day.   Contacts, glasses, hearing aids, dentures or bridgework may not be worn into surgery.    Leave your suitcase in the car.  After surgery it may be brought to your room.  For patients admitted to the hospital, discharge time will be determined by your treatment team.  Patients discharged the day of surgery will not be allowed to drive home.    Special instructions:   [facility]- Preparing For Surgery  Before surgery, you can play an important role. Because skin is not sterile, your skin needs to be as free of germs as possible. You can reduce the number of germs on your skin by washing with CHG (chlorahexidine gluconate) Soap before surgery.  CHG is an antiseptic cleaner which kills germs and bonds with the skin to continue killing germs even after washing.    Oral Hygiene is also important to reduce your risk of infection.  Remember - BRUSH YOUR TEETH THE  MORNING OF SURGERY WITH YOUR REGULAR TOOTHPASTE  Please do not use if you have an allergy to CHG or antibacterial soaps. If your skin becomes reddened/irritated stop using the CHG.  Do not shave (including legs and underarms) for at least 48 hours prior to first CHG shower. It is OK to shave your face.  Please follow these instructions carefully.   1. Shower the NIGHT BEFORE SURGERY and the MORNING OF SURGERY with CHG Soap.   2. If you chose to wash your hair, wash your hair first as usual with your normal shampoo.  3. After you shampoo, rinse your hair and body thoroughly to remove the shampoo.  4. Use CHG as you would any other liquid soap. You can apply CHG directly to the skin and wash gently with a scrungie or a clean washcloth.   5. Apply the CHG Soap to your body ONLY FROM THE NECK DOWN.  Do not use on open wounds or open sores. Avoid contact with your eyes, ears, mouth and genitals (private parts). Wash Face and genitals (private parts)  with your normal soap.   6. Wash  thoroughly, paying special attention to the area where your surgery will be performed.  7. Thoroughly rinse your body with warm water from the neck down.  8. DO NOT shower/wash with your normal soap after using and rinsing off the CHG Soap.  9. Pat yourself dry with a CLEAN TOWEL.  10. Wear CLEAN PAJAMAS to bed the night before surgery, wear comfortable clothes the morning of surgery  11. Place CLEAN SHEETS on your bed the night of your first shower and DO NOT SLEEP WITH PETS.    Day of Surgery:  Do not apply any deodorants/lotions. Please shower the morning of surgery with the CHG soap  Please wear clean clothes to the hospital/surgery center.   Remember to brush your teeth WITH YOUR REGULAR TOOTHPASTE.   Please read over the following fact sheets that you were given.

## 2018-09-03 ENCOUNTER — Encounter (HOSPITAL_COMMUNITY): Payer: Self-pay

## 2018-09-03 ENCOUNTER — Other Ambulatory Visit: Payer: Self-pay

## 2018-09-03 ENCOUNTER — Encounter (HOSPITAL_COMMUNITY)
Admission: RE | Admit: 2018-09-03 | Discharge: 2018-09-03 | Disposition: A | Discharge status: Home / Self Care | Payer: Medicare HMO | Source: Ambulatory Visit | Attending: Neurological Surgery | Admitting: Neurological Surgery

## 2018-09-03 ENCOUNTER — Other Ambulatory Visit (HOSPITAL_COMMUNITY)
Admission: RE | Admit: 2018-09-03 | Discharge: 2018-09-03 | Disposition: A | Discharge status: Home / Self Care | Payer: Medicare HMO | Source: Ambulatory Visit | Attending: Neurological Surgery | Admitting: Neurological Surgery

## 2018-09-03 DIAGNOSIS — Z794 Long term (current) use of insulin: Secondary | ICD-10-CM | POA: Insufficient documentation

## 2018-09-03 DIAGNOSIS — F419 Anxiety disorder, unspecified: Secondary | ICD-10-CM | POA: Insufficient documentation

## 2018-09-03 DIAGNOSIS — Z79890 Hormone replacement therapy (postmenopausal): Secondary | ICD-10-CM | POA: Insufficient documentation

## 2018-09-03 DIAGNOSIS — Z01818 Encounter for other preprocedural examination: Secondary | ICD-10-CM | POA: Diagnosis present

## 2018-09-03 DIAGNOSIS — Z9641 Presence of insulin pump (external) (internal): Secondary | ICD-10-CM | POA: Diagnosis not present

## 2018-09-03 DIAGNOSIS — Z79899 Other long term (current) drug therapy: Secondary | ICD-10-CM | POA: Insufficient documentation

## 2018-09-03 DIAGNOSIS — Z7951 Long term (current) use of inhaled steroids: Secondary | ICD-10-CM | POA: Insufficient documentation

## 2018-09-03 DIAGNOSIS — M5137 Other intervertebral disc degeneration, lumbosacral region: Secondary | ICD-10-CM | POA: Insufficient documentation

## 2018-09-03 DIAGNOSIS — K219 Gastro-esophageal reflux disease without esophagitis: Secondary | ICD-10-CM | POA: Insufficient documentation

## 2018-09-03 DIAGNOSIS — M5136 Other intervertebral disc degeneration, lumbar region: Secondary | ICD-10-CM | POA: Diagnosis not present

## 2018-09-03 DIAGNOSIS — Z01812 Encounter for preprocedural laboratory examination: Secondary | ICD-10-CM | POA: Insufficient documentation

## 2018-09-03 DIAGNOSIS — G4733 Obstructive sleep apnea (adult) (pediatric): Secondary | ICD-10-CM | POA: Insufficient documentation

## 2018-09-03 DIAGNOSIS — Z7952 Long term (current) use of systemic steroids: Secondary | ICD-10-CM | POA: Insufficient documentation

## 2018-09-03 DIAGNOSIS — F329 Major depressive disorder, single episode, unspecified: Secondary | ICD-10-CM | POA: Diagnosis not present

## 2018-09-03 DIAGNOSIS — Z1159 Encounter for screening for other viral diseases: Secondary | ICD-10-CM | POA: Diagnosis not present

## 2018-09-03 DIAGNOSIS — E119 Type 2 diabetes mellitus without complications: Secondary | ICD-10-CM | POA: Insufficient documentation

## 2018-09-03 DIAGNOSIS — J45909 Unspecified asthma, uncomplicated: Secondary | ICD-10-CM | POA: Insufficient documentation

## 2018-09-03 DIAGNOSIS — I10 Essential (primary) hypertension: Secondary | ICD-10-CM | POA: Insufficient documentation

## 2018-09-03 HISTORY — DX: Sleep apnea, unspecified: G47.30

## 2018-09-03 HISTORY — DX: Nausea with vomiting, unspecified: Z98.890

## 2018-09-03 HISTORY — DX: Nausea with vomiting, unspecified: R11.2

## 2018-09-03 LAB — BASIC METABOLIC PANEL
Anion gap: 10 (ref 5–15)
BUN: 14 mg/dL (ref 8–23)
CO2: 24 mmol/L (ref 22–32)
Calcium: 9.2 mg/dL (ref 8.9–10.3)
Chloride: 105 mmol/L (ref 98–111)
Creatinine, Ser: 0.99 mg/dL (ref 0.44–1.00)
GFR calc Af Amer: >60 mL/min (ref >60)
GFR calc non Af Amer: >60 mL/min (ref >60)
Glucose, Bld: 162 mg/dL — ABNORMAL HIGH (ref 70–99)
Potassium: 4.5 mmol/L (ref 3.5–5.1)
Sodium: 139 mmol/L (ref 135–145)

## 2018-09-03 LAB — SURGICAL PCR SCREEN
MRSA, PCR: NEGATIVE
Staphylococcus aureus: POSITIVE — AB

## 2018-09-03 LAB — HEMOGLOBIN A1C
Hgb A1c MFr Bld: 6.9 % — ABNORMAL HIGH (ref 4.8–5.6)
Mean Plasma Glucose: 151.33 mg/dL

## 2018-09-03 LAB — CBC
HCT: 46.2 % — ABNORMAL HIGH (ref 36.0–46.0)
Hemoglobin: 15.2 g/dL — ABNORMAL HIGH (ref 12.0–15.0)
MCH: 32.3 pg (ref 26.0–34.0)
MCHC: 32.9 g/dL (ref 30.0–36.0)
MCV: 98.1 fL (ref 80.0–100.0)
Platelets: 289 10*3/uL (ref 150–400)
RBC: 4.71 MIL/uL (ref 3.87–5.11)
RDW: 12.2 % (ref 11.5–15.5)
WBC: 8.6 10*3/uL (ref 4.0–10.5)
nRBC: 0 % (ref 0.0–0.2)

## 2018-09-03 LAB — TYPE AND SCREEN
ABO/RH(D): O POS
Antibody Screen: NEGATIVE

## 2018-09-03 LAB — SARS CORONAVIRUS 2 (TAT 6-24 HRS): SARS Coronavirus 2: NEGATIVE

## 2018-09-03 LAB — ABO/RH: ABO/RH(D): O POS

## 2018-09-03 LAB — GLUCOSE, CAPILLARY: Glucose-Capillary: 172 mg/dL — ABNORMAL HIGH (ref 70–99)

## 2018-09-03 NOTE — Progress Notes (Signed)
PCP - Egbert Garibaldi  Autumn Jones manages diabetes Cardiologist - denies  Chest x-ray - not needed EKG - requesting Stress Test - denies ECHO - denies Cardiac Cath - denies  Sleep Study - > 10-15 years CPAP - NO  Fasting Blood Sugar - 80-100 Checks Blood Sugar ___5-7__ times a day    Anesthesia review: yes  Patient denies shortness of breath, fever, cough and chest pain at PAT appointment   Patient verbalized understanding of instructions that were given to them at the PAT appointment. Patient was also instructed that they will need to review over the PAT instructions again at home before surgery.

## 2018-09-04 NOTE — Anesthesia Preprocedure Evaluation (Addendum)
Anesthesia Evaluation  Patient identified by MRN, date of birth, ID band Patient awake    Reviewed: Allergy & Precautions, NPO status , Patient's Chart, lab work & pertinent test results  History of Anesthesia Complications (+) PONV  Airway Mallampati: I  TM Distance: >3 FB Neck ROM: Full    Dental   Pulmonary    Pulmonary exam normal        Cardiovascular hypertension, Pt. on medications Normal cardiovascular exam     Neuro/Psych Anxiety Depression    GI/Hepatic GERD  Medicated and Controlled,  Endo/Other  diabetes, Type 2, Insulin Dependent  Renal/GU      Musculoskeletal   Abdominal   Peds  Hematology   Anesthesia Other Findings   Reproductive/Obstetrics                            Anesthesia Physical Anesthesia Plan  ASA: III  Anesthesia Plan: General   Post-op Pain Management:    Induction: Intravenous  PONV Risk Score and Plan: 4 or greater and Ondansetron, Dexamethasone, Midazolam, Scopolamine patch - Pre-op and Promethazine  Airway Management Planned: Oral ETT  Additional Equipment:   Intra-op Plan:   Post-operative Plan: Extubation in OR  Informed Consent: I have reviewed the patients History and Physical, chart, labs and discussed the procedure including the risks, benefits and alternatives for the proposed anesthesia with the patient or authorized representative who has indicated his/her understanding and acceptance.       Plan Discussed with: CRNA and Surgeon  Anesthesia Plan Comments: (PAT note written 09/04/2018 by Myra Gianotti, PA-C. )       Anesthesia Quick Evaluation

## 2018-09-04 NOTE — Progress Notes (Signed)
Anesthesia Chart Review:  Case: [id number] Date/Time: 09/07/18 0715   Procedures:      Lumbar 4-5 Lumbar 5 Sacral 1 Anterior lumbar interbody fusion (N/A ) - Lumbar 4-5 Lumbar 5 Sacral 1 Anterior lumbar interbody fusion     ABDOMINAL EXPOSURE (N/A )   Anesthesia type: General   Pre-op diagnosis: Degenerative disc disease, Lumbar   Location: [facility] OR ROOM 20 / [place] OR   Surgeon: Kristeen Miss, MD; Early, Arvilla Meres, MD      DISCUSSION: Patient is a 63 year old female scheduled for the above procedures.  History includes never smoker, post-operative N/V, GERD, asthma, allergic rhinitis, HTN, DM2 (insulin pump), OSA (refused CPAP), neck surgery (2000, 2003). BMI 40 is consistent with morbid obesity.   Negative presurgical COVID-19 test on 09/03/2018.  Recent cardiac testing in May (see below).  Diabetes is well controlled.  Will enter order for diabetes coordinator consult given she has an insulin pump.  She denied shortness of breath, cough, fever, chest pain at PAT RN visit.  If no acute changes then I would anticipate that she can proceed as planned. Med list includes daily prednisone 10 mg.   VS: BP 130/70   Temp 36.5 C   Resp 20   Ht 5\' 3"  (1.6 m)   Wt 102.7 kg   SpO2 98%   BMI 40.10 kg/m     PROVIDERS: Bulla, Eather Colas is PCP [facility]). Last encounter 08/21/18.  Amalia Greenhouse, MD is endocrinologist ([place]). Last visit 08/25/18 with Ronnald Ramp, Autumn, PA-C. She reviewed with patient how to run a temporary basal the night prior to surgery, otherwise no insulin pump changes.  Prince Solian, MD is allergist Cordie Grice, MD is psychiatrist   LABS: Labs reviewed: Acceptable for surgery. A1c 6.6 % on 08/25/18 [facility]).  (all labs ordered are listed, but only abnormal results are displayed)  Labs Reviewed  SURGICAL PCR SCREEN - Abnormal; Notable for the following components:      Result Value   Staphylococcus aureus POSITIVE (*)    All other components within  normal limits  GLUCOSE, CAPILLARY - Abnormal; Notable for the following components:   Glucose-Capillary 172 (*)    All other components within normal limits  CBC - Abnormal; Notable for the following components:   Hemoglobin 15.2 (*)    HCT 46.2 (*)    All other components within normal limits  BASIC METABOLIC PANEL - Abnormal; Notable for the following components:   Glucose, Bld 162 (*)    All other components within normal limits  HEMOGLOBIN A1C - Abnormal; Notable for the following components:   Hgb A1c MFr Bld 6.9 (*)    All other components within normal limits  TYPE AND SCREEN  ABO/RH    Spirometry 08/24/18: FVC 1.72 (55%). FEV1 1.42 (60%). FEV1/FVC 0.83 (108%). FEF 25-75 2.1.69 (78%). Moderate restriction.   IMAGES: MRI L-spine 03/08/18: IMPRESSION: 1. Lumbar spondylosis and degenerative disc disease causing mild impingement at L4-5 and L5-S1. The left-sided findings at L5-S1 are mildly worsened from 2018, but the right-sided findings at L4-5 are similar. 2. Prior right laminectomy at L4-5.   EKG: 06/26/18 [facility]): SR, non-specific T wave abnormality. "Stable from 2019."     CV:  Nuclear stress test 07/09/18 [facility]): Impressions: The resting ECG shows normal sinus rhythm.  The resting and stress ECG shows normal ST segment; and no ventricular tachycardia, significant QRS prolongation or heart block.  Both the rest and stress images are  within normal limits.  No significant reversible ischemia or fixed scar.  Gated left ventricular ejection fraction is normal at 73%.  Normal LV segmental wall motion.  Echo 07/03/18 [facility]): Conclusions: Normal study: Normal cardiac chamber sizes and function, LVEF 60-65%; normal valve anatomy and function; no pericardial effusion or intracardiac mass.  Normal thoracic aorta and aortic arch.   Past Medical History:  Diagnosis Date  . Anxiety   . Asthma   . Back pain   . Depression    . Diabetes mellitus    type 2 with insulin pump  . GERD (gastroesophageal reflux disease)   . Hypertension   . Hypokalemia   . Lumbar spondylosis    and degenerative disc disease causing mild impingement at L4-5 and L5-S1.  Marland Kitchen. PONV (postoperative nausea and vomiting)    Severe  . Sleep apnea    Study > 10-15 yeara ago, patient refuesed CPAP  . Weakness of back    and legs    Past Surgical History:  Procedure Laterality Date  . ABDOMINAL HYSTERECTOMY    . APPENDECTOMY    . BACK SURGERY     Right Laminectomy at L4-5  . CHOLECYSTECTOMY    . NECK SURGERY     x2    MEDICATIONS: . acyclovir (ZOVIRAX) 400 MG tablet  . albuterol (PROVENTIL HFA;VENTOLIN HFA) 108 (90 Base) MCG/ACT inhaler  . albuterol (PROVENTIL) (2.5 MG/3ML) 0.083% nebulizer solution  . atenolol (TENORMIN) 25 MG tablet  . budesonide-formoterol (SYMBICORT) 160-4.5 MCG/ACT inhaler  . busPIRone (BUSPAR) 10 MG tablet  . dexlansoprazole (DEXILANT) 60 MG capsule  . Diethylpropion HCl CR 75 MG TB24  . doxepin (SINEQUAN) 25 MG capsule  . EPINEPHrine (EPIPEN 2-PAK) 0.3 mg/0.3 mL IJ SOAJ injection  . escitalopram (LEXAPRO) 10 MG tablet  . estrogens-methylTEST (ESTRATEST) 1.25-2.5 MG tablet  . fluticasone (FLONASE) 50 MCG/ACT nasal spray  . gabapentin (NEURONTIN) 600 MG tablet  . glucose blood (CONTOUR NEXT TEST) test strip  . guaifenesin (HUMIBID E) 400 MG TABS  . HYDROcodone-acetaminophen (NORCO) 10-325 MG tablet  . insulin aspart (NOVOLOG) 100 UNIT/ML injection  . Insulin Infusion Pump DEVI  . levocetirizine (XYZAL) 5 MG tablet  . levothyroxine (SYNTHROID, LEVOTHROID) 50 MCG tablet  . linaclotide (LINZESS) 290 MCG CAPS capsule  . LORazepam (ATIVAN) 0.5 MG tablet  . lubiprostone (AMITIZA) 8 MCG capsule  . metolazone (ZAROXOLYN) 2.5 MG tablet  . metoprolol succinate (TOPROL-XL) 25 MG 24 hr tablet  . montelukast (SINGULAIR) 10 MG tablet  . Potassium Chloride ER 20 MEQ TBCR  . pregabalin (LYRICA) 150 MG capsule  .  promethazine (PHENERGAN) 25 MG tablet  . QUEtiapine (SEROQUEL) 100 MG tablet  . rosuvastatin (CRESTOR) 10 MG tablet  . sertraline (ZOLOFT) 50 MG tablet  . topiramate (TOPAMAX) 25 MG tablet   . Benralizumab SOSY 30 mg  . predniSONE (DELTASONE) tablet 10 mg     Shonna ChockAllison Shontay Wallner, PA-C Surgical Short Stay/Anesthesiology [facility] Phone [phone] [facility] Phone [phone] 09/04/2018 3:11 PM

## 2018-09-07 ENCOUNTER — Inpatient Hospital Stay (HOSPITAL_COMMUNITY)
Admission: RE | Admit: 2018-09-07 | Discharge: 2018-09-10 | DRG: 460 | Disposition: A | Discharge status: Home / Self Care | Payer: Medicare HMO | Attending: Neurological Surgery | Admitting: Neurological Surgery

## 2018-09-07 ENCOUNTER — Encounter (HOSPITAL_COMMUNITY): Payer: Self-pay

## 2018-09-07 ENCOUNTER — Inpatient Hospital Stay (HOSPITAL_COMMUNITY): Payer: Medicare HMO

## 2018-09-07 ENCOUNTER — Inpatient Hospital Stay (HOSPITAL_COMMUNITY): Payer: Medicare HMO | Admitting: Vascular Surgery

## 2018-09-07 ENCOUNTER — Other Ambulatory Visit: Payer: Self-pay

## 2018-09-07 ENCOUNTER — Inpatient Hospital Stay (HOSPITAL_COMMUNITY)
Admission: RE | Disposition: A | Discharge status: Home / Self Care | Payer: Self-pay | Source: Home / Self Care | Attending: Neurological Surgery

## 2018-09-07 ENCOUNTER — Other Ambulatory Visit (HOSPITAL_COMMUNITY): Payer: Self-pay | Admitting: Radiology

## 2018-09-07 DIAGNOSIS — M4727 Other spondylosis with radiculopathy, lumbosacral region: Secondary | ICD-10-CM | POA: Diagnosis present

## 2018-09-07 DIAGNOSIS — M5416 Radiculopathy, lumbar region: Secondary | ICD-10-CM

## 2018-09-07 DIAGNOSIS — M4716 Other spondylosis with myelopathy, lumbar region: Secondary | ICD-10-CM

## 2018-09-07 DIAGNOSIS — M5116 Intervertebral disc disorders with radiculopathy, lumbar region: Secondary | ICD-10-CM | POA: Diagnosis present

## 2018-09-07 DIAGNOSIS — M5117 Intervertebral disc disorders with radiculopathy, lumbosacral region: Secondary | ICD-10-CM | POA: Diagnosis present

## 2018-09-07 DIAGNOSIS — Z888 Allergy status to other drugs, medicaments and biological substances status: Secondary | ICD-10-CM | POA: Diagnosis not present

## 2018-09-07 DIAGNOSIS — Z881 Allergy status to other antibiotic agents status: Secondary | ICD-10-CM | POA: Diagnosis not present

## 2018-09-07 DIAGNOSIS — Z885 Allergy status to narcotic agent status: Secondary | ICD-10-CM

## 2018-09-07 DIAGNOSIS — Z79899 Other long term (current) drug therapy: Secondary | ICD-10-CM

## 2018-09-07 DIAGNOSIS — I10 Essential (primary) hypertension: Secondary | ICD-10-CM | POA: Diagnosis present

## 2018-09-07 DIAGNOSIS — M4726 Other spondylosis with radiculopathy, lumbar region: Secondary | ICD-10-CM | POA: Diagnosis present

## 2018-09-07 DIAGNOSIS — M48061 Spinal stenosis, lumbar region without neurogenic claudication: Secondary | ICD-10-CM | POA: Diagnosis present

## 2018-09-07 DIAGNOSIS — J45909 Unspecified asthma, uncomplicated: Secondary | ICD-10-CM | POA: Diagnosis present

## 2018-09-07 DIAGNOSIS — D62 Acute posthemorrhagic anemia: Secondary | ICD-10-CM | POA: Diagnosis not present

## 2018-09-07 DIAGNOSIS — E119 Type 2 diabetes mellitus without complications: Secondary | ICD-10-CM | POA: Diagnosis present

## 2018-09-07 DIAGNOSIS — Z882 Allergy status to sulfonamides status: Secondary | ICD-10-CM | POA: Diagnosis not present

## 2018-09-07 DIAGNOSIS — Z419 Encounter for procedure for purposes other than remedying health state, unspecified: Secondary | ICD-10-CM

## 2018-09-07 DIAGNOSIS — G4730 Sleep apnea, unspecified: Secondary | ICD-10-CM | POA: Diagnosis present

## 2018-09-07 DIAGNOSIS — F329 Major depressive disorder, single episode, unspecified: Secondary | ICD-10-CM | POA: Diagnosis present

## 2018-09-07 DIAGNOSIS — Z825 Family history of asthma and other chronic lower respiratory diseases: Secondary | ICD-10-CM

## 2018-09-07 DIAGNOSIS — Z7951 Long term (current) use of inhaled steroids: Secondary | ICD-10-CM | POA: Diagnosis not present

## 2018-09-07 DIAGNOSIS — Z90710 Acquired absence of both cervix and uterus: Secondary | ICD-10-CM

## 2018-09-07 DIAGNOSIS — Z794 Long term (current) use of insulin: Secondary | ICD-10-CM

## 2018-09-07 DIAGNOSIS — M5137 Other intervertebral disc degeneration, lumbosacral region: Secondary | ICD-10-CM

## 2018-09-07 DIAGNOSIS — M4807 Spinal stenosis, lumbosacral region: Secondary | ICD-10-CM | POA: Diagnosis present

## 2018-09-07 DIAGNOSIS — M5136 Other intervertebral disc degeneration, lumbar region: Secondary | ICD-10-CM

## 2018-09-07 DIAGNOSIS — J45901 Unspecified asthma with (acute) exacerbation: Secondary | ICD-10-CM

## 2018-09-07 DIAGNOSIS — K219 Gastro-esophageal reflux disease without esophagitis: Secondary | ICD-10-CM | POA: Diagnosis present

## 2018-09-07 DIAGNOSIS — M2578 Osteophyte, vertebrae: Secondary | ICD-10-CM | POA: Diagnosis present

## 2018-09-07 DIAGNOSIS — Z9641 Presence of insulin pump (external) (internal): Secondary | ICD-10-CM | POA: Diagnosis present

## 2018-09-07 DIAGNOSIS — Z79890 Hormone replacement therapy (postmenopausal): Secondary | ICD-10-CM

## 2018-09-07 DIAGNOSIS — F419 Anxiety disorder, unspecified: Secondary | ICD-10-CM | POA: Diagnosis present

## 2018-09-07 DIAGNOSIS — Z9049 Acquired absence of other specified parts of digestive tract: Secondary | ICD-10-CM

## 2018-09-07 HISTORY — PX: ABDOMINAL EXPOSURE: SHX5708

## 2018-09-07 HISTORY — PX: ANTERIOR LUMBAR FUSION: SHX1170

## 2018-09-07 LAB — GLUCOSE, CAPILLARY
Glucose-Capillary: 245 mg/dL — ABNORMAL HIGH (ref 70–99)
Glucose-Capillary: 241 mg/dL — ABNORMAL HIGH (ref 70–99)
Glucose-Capillary: 187 mg/dL — ABNORMAL HIGH (ref 70–99)
Glucose-Capillary: 258 mg/dL — ABNORMAL HIGH (ref 70–99)
Glucose-Capillary: 220 mg/dL — ABNORMAL HIGH (ref 70–99)
Glucose-Capillary: 214 mg/dL — ABNORMAL HIGH (ref 70–99)
Glucose-Capillary: 232 mg/dL — ABNORMAL HIGH (ref 70–99)
Glucose-Capillary: 212 mg/dL — ABNORMAL HIGH (ref 70–99)
Glucose-Capillary: 210 mg/dL — ABNORMAL HIGH (ref 70–99)

## 2018-09-07 SURGERY — ANTERIOR LUMBAR FUSION 2 LEVELS
Anesthesia: General | Site: Spine Lumbar

## 2018-09-07 MED ORDER — SUGAMMADEX SODIUM 200 MG/2ML IV SOLN
INTRAVENOUS | Status: DC | PRN
  Administered 2018-09-07: 205.4 mg via INTRAVENOUS

## 2018-09-07 MED ORDER — ACYCLOVIR 400 MG PO TABS
400.0000 mg | ORAL_TABLET | Freq: Two times a day (BID) | ORAL | Status: DC
  Administered 2018-09-07 – 2018-09-10 (×6): 400 mg via ORAL
  Filled 2018-09-07 (×6): qty 1

## 2018-09-07 MED ORDER — METOPROLOL SUCCINATE ER 25 MG PO TB24
25.0000 mg | ORAL_TABLET | Freq: Every day | ORAL | Status: DC
  Administered 2018-09-08 – 2018-09-09 (×2): 25 mg via ORAL
  Filled 2018-09-07 (×2): qty 1

## 2018-09-07 MED ORDER — MENTHOL 3 MG MT LOZG: 1.0000 | LOZENGE | OROMUCOSAL | Status: DC | PRN

## 2018-09-07 MED ORDER — INSULIN ASPART 100 UNIT/ML ~~LOC~~ SOLN: 0.0000 [IU] | Freq: Three times a day (TID) | SUBCUTANEOUS | Status: DC

## 2018-09-07 MED ORDER — CEFAZOLIN SODIUM-DEXTROSE 2-4 GM/100ML-% IV SOLN
2.0000 g | INTRAVENOUS | Status: AC
  Administered 2018-09-07 (×2): 2 g via INTRAVENOUS
  Filled 2018-09-07: qty 100

## 2018-09-07 MED ORDER — INSULIN PUMP
SUBCUTANEOUS | Status: DC
  Administered 2018-09-07: 20:00:00 25 via SUBCUTANEOUS
  Administered 2018-09-08: 16:00:00 via SUBCUTANEOUS
  Administered 2018-09-08: 20:00:00 20 via SUBCUTANEOUS
  Administered 2018-09-09 (×2): via SUBCUTANEOUS
  Administered 2018-09-09: 22:00:00 30 via SUBCUTANEOUS
  Administered 2018-09-10 (×2): via SUBCUTANEOUS
  Filled 2018-09-07: qty 1

## 2018-09-07 MED ORDER — PANTOPRAZOLE SODIUM 40 MG PO TBEC
40.0000 mg | DELAYED_RELEASE_TABLET | Freq: Every day | ORAL | Status: DC
  Administered 2018-09-08 – 2018-09-10 (×3): 40 mg via ORAL
  Filled 2018-09-07 (×3): qty 1

## 2018-09-07 MED ORDER — MIDAZOLAM HCL 2 MG/2ML IJ SOLN
INTRAMUSCULAR | Status: DC | PRN
  Administered 2018-09-07: 2 mg via INTRAVENOUS

## 2018-09-07 MED ORDER — MOMETASONE FURO-FORMOTEROL FUM 200-5 MCG/ACT IN AERO
2.0000 | INHALATION_SPRAY | Freq: Two times a day (BID) | RESPIRATORY_TRACT | Status: DC
  Filled 2018-09-07: qty 8.8

## 2018-09-07 MED ORDER — ONDANSETRON HCL 4 MG/2ML IJ SOLN
INTRAMUSCULAR | Status: DC | PRN
  Administered 2018-09-07 (×2): 4 mg via INTRAVENOUS

## 2018-09-07 MED ORDER — CHLORHEXIDINE GLUCONATE 4 % EX LIQD: 60.0000 mL | Freq: Once | CUTANEOUS | Status: DC

## 2018-09-07 MED ORDER — HYDROMORPHONE HCL 1 MG/ML IJ SOLN
INTRAMUSCULAR | Status: AC
  Filled 2018-09-07: qty 1

## 2018-09-07 MED ORDER — SERTRALINE HCL 50 MG PO TABS
50.0000 mg | ORAL_TABLET | Freq: Every day | ORAL | Status: DC
  Administered 2018-09-08 – 2018-09-10 (×3): 50 mg via ORAL
  Filled 2018-09-07 (×3): qty 1

## 2018-09-07 MED ORDER — LEVOTHYROXINE SODIUM 50 MCG PO TABS
50.0000 ug | ORAL_TABLET | Freq: Every day | ORAL | Status: DC
  Administered 2018-09-08 – 2018-09-10 (×3): 50 ug via ORAL
  Filled 2018-09-07 (×3): qty 1

## 2018-09-07 MED ORDER — LIDOCAINE-EPINEPHRINE 1 %-1:100000 IJ SOLN
INTRAMUSCULAR | Status: AC
  Filled 2018-09-07: qty 1

## 2018-09-07 MED ORDER — POTASSIUM CHLORIDE CRYS ER 20 MEQ PO TBCR
20.0000 meq | EXTENDED_RELEASE_TABLET | Freq: Every day | ORAL | Status: DC
  Administered 2018-09-08 – 2018-09-10 (×3): 20 meq via ORAL
  Filled 2018-09-07 (×3): qty 1

## 2018-09-07 MED ORDER — PHENOL 1.4 % MT LIQD: 1.0000 | OROMUCOSAL | Status: DC | PRN

## 2018-09-07 MED ORDER — MORPHINE SULFATE (PF) 2 MG/ML IV SOLN: 2.0000 mg | INTRAVENOUS | Status: DC | PRN

## 2018-09-07 MED ORDER — ONDANSETRON HCL 4 MG/2ML IJ SOLN
INTRAMUSCULAR | Status: AC
  Filled 2018-09-07: qty 2

## 2018-09-07 MED ORDER — DROPERIDOL 2.5 MG/ML IJ SOLN
0.6250 mg | Freq: Once | INTRAMUSCULAR | Status: AC
  Administered 2018-09-07: 0.625 mg via INTRAVENOUS
  Filled 2018-09-07 (×2): qty 2

## 2018-09-07 MED ORDER — METHOCARBAMOL 500 MG PO TABS
500.0000 mg | ORAL_TABLET | Freq: Four times a day (QID) | ORAL | Status: DC | PRN
  Administered 2018-09-09 – 2018-09-10 (×2): 500 mg via ORAL
  Filled 2018-09-07 (×2): qty 1

## 2018-09-07 MED ORDER — GUAIFENESIN ER 600 MG PO TB12
600.0000 mg | ORAL_TABLET | Freq: Two times a day (BID) | ORAL | Status: DC
  Administered 2018-09-07 – 2018-09-10 (×6): 600 mg via ORAL
  Filled 2018-09-07 (×6): qty 1

## 2018-09-07 MED ORDER — DOXEPIN HCL 25 MG PO CAPS
25.0000 mg | ORAL_CAPSULE | Freq: Every day | ORAL | Status: DC
  Administered 2018-09-07 – 2018-09-09 (×3): 25 mg via ORAL
  Filled 2018-09-07 (×4): qty 1

## 2018-09-07 MED ORDER — INSULIN ASPART 100 UNIT/ML IV SOLN
5.0000 [IU] | Freq: Once | INTRAVENOUS | Status: DC
  Filled 2018-09-07: qty 0.05

## 2018-09-07 MED ORDER — EPINEPHRINE 0.3 MG/0.3ML IJ SOAJ
0.3000 mg | Freq: Once | INTRAMUSCULAR | Status: DC | PRN
  Filled 2018-09-07: qty 0.6

## 2018-09-07 MED ORDER — ONDANSETRON HCL 4 MG/2ML IJ SOLN: 4.0000 mg | Freq: Four times a day (QID) | INTRAMUSCULAR | Status: DC | PRN

## 2018-09-07 MED ORDER — SODIUM CHLORIDE 0.9 % IV SOLN
INTRAVENOUS | Status: DC | PRN
  Administered 2018-09-07: 08:00:00 25 ug/min via INTRAVENOUS

## 2018-09-07 MED ORDER — LACTATED RINGERS IV SOLN
INTRAVENOUS | Status: DC | PRN
  Administered 2018-09-07 (×2): via INTRAVENOUS

## 2018-09-07 MED ORDER — ROSUVASTATIN CALCIUM 5 MG PO TABS
10.0000 mg | ORAL_TABLET | Freq: Every day | ORAL | Status: DC
  Administered 2018-09-08 – 2018-09-10 (×3): 10 mg via ORAL
  Filled 2018-09-07 (×3): qty 2

## 2018-09-07 MED ORDER — ROCURONIUM BROMIDE 50 MG/5ML IV SOSY
PREFILLED_SYRINGE | INTRAVENOUS | Status: DC | PRN
  Administered 2018-09-07: 30 mg via INTRAVENOUS
  Administered 2018-09-07: 100 mg via INTRAVENOUS

## 2018-09-07 MED ORDER — METOLAZONE 2.5 MG PO TABS
2.5000 mg | ORAL_TABLET | Freq: Two times a day (BID) | ORAL | Status: DC
  Administered 2018-09-07 – 2018-09-10 (×5): 2.5 mg via ORAL
  Filled 2018-09-07 (×7): qty 1

## 2018-09-07 MED ORDER — LEVOCETIRIZINE DIHYDROCHLORIDE 5 MG PO TABS: 5.0000 mg | ORAL_TABLET | Freq: Every evening | ORAL | Status: DC

## 2018-09-07 MED ORDER — EPINEPHRINE 0.3 MG/0.3ML IJ SOAJ: 0.3000 mg | Freq: Once | INTRAMUSCULAR | Status: DC

## 2018-09-07 MED ORDER — CHLORHEXIDINE GLUCONATE CLOTH 2 % EX PADS: 6.0000 | MEDICATED_PAD | Freq: Once | CUTANEOUS | Status: DC

## 2018-09-07 MED ORDER — THROMBIN 5000 UNITS EX SOLR
CUTANEOUS | Status: AC
  Filled 2018-09-07: qty 5000

## 2018-09-07 MED ORDER — SENNA 8.6 MG PO TABS
1.0000 | ORAL_TABLET | Freq: Two times a day (BID) | ORAL | Status: DC
  Administered 2018-09-08 – 2018-09-09 (×2): 8.6 mg via ORAL
  Filled 2018-09-07 (×5): qty 1

## 2018-09-07 MED ORDER — BUPIVACAINE HCL (PF) 0.5 % IJ SOLN
INTRAMUSCULAR | Status: DC | PRN
  Administered 2018-09-07: 10 mL

## 2018-09-07 MED ORDER — GUAIFENESIN 400 MG PO TABS: 400.0000 mg | ORAL_TABLET | Freq: Two times a day (BID) | ORAL | Status: DC

## 2018-09-07 MED ORDER — TOPIRAMATE 25 MG PO TABS
25.0000 mg | ORAL_TABLET | Freq: Every day | ORAL | Status: DC
  Administered 2018-09-08 – 2018-09-09 (×2): 25 mg via ORAL
  Filled 2018-09-07 (×2): qty 1

## 2018-09-07 MED ORDER — DIETHYLPROPION HCL ER 75 MG PO TB24: 75.0000 mg | ORAL_TABLET | Freq: Every day | ORAL | Status: DC

## 2018-09-07 MED ORDER — ONDANSETRON HCL 4 MG PO TABS: 4.0000 mg | ORAL_TABLET | Freq: Four times a day (QID) | ORAL | Status: DC | PRN

## 2018-09-07 MED ORDER — ONDANSETRON HCL 4 MG/2ML IJ SOLN
4.0000 mg | Freq: Once | INTRAMUSCULAR | Status: AC | PRN
  Administered 2018-09-07: 14:00:00 4 mg via INTRAVENOUS

## 2018-09-07 MED ORDER — ESCITALOPRAM OXALATE 10 MG PO TABS
10.0000 mg | ORAL_TABLET | Freq: Every day | ORAL | Status: DC
  Administered 2018-09-08 – 2018-09-10 (×3): 10 mg via ORAL
  Filled 2018-09-07 (×3): qty 1

## 2018-09-07 MED ORDER — FENTANYL CITRATE (PF) 250 MCG/5ML IJ SOLN
INTRAMUSCULAR | Status: AC
  Filled 2018-09-07: qty 5

## 2018-09-07 MED ORDER — PROPOFOL 10 MG/ML IV BOLUS
INTRAVENOUS | Status: DC | PRN
  Administered 2018-09-07: 100 mg via INTRAVENOUS

## 2018-09-07 MED ORDER — PROMETHAZINE HCL 25 MG/ML IJ SOLN
12.5000 mg | INTRAMUSCULAR | Status: AC | PRN
  Administered 2018-09-07 (×2): 12.5 mg via INTRAVENOUS

## 2018-09-07 MED ORDER — PROMETHAZINE HCL 25 MG/ML IJ SOLN
INTRAMUSCULAR | Status: AC
  Filled 2018-09-07: qty 1

## 2018-09-07 MED ORDER — SCOPOLAMINE 1 MG/3DAYS TD PT72
MEDICATED_PATCH | TRANSDERMAL | Status: DC | PRN
  Administered 2018-09-07: 1 via TRANSDERMAL

## 2018-09-07 MED ORDER — BISACODYL 10 MG RE SUPP: 10.0000 mg | Freq: Every day | RECTAL | Status: DC | PRN

## 2018-09-07 MED ORDER — LACTATED RINGERS IV SOLN: INTRAVENOUS | Status: DC

## 2018-09-07 MED ORDER — LIDOCAINE 2% (20 MG/ML) 5 ML SYRINGE
INTRAMUSCULAR | Status: DC | PRN
  Administered 2018-09-07: 100 mg via INTRAVENOUS

## 2018-09-07 MED ORDER — SODIUM CHLORIDE 0.9 % IV SOLN
INTRAVENOUS | Status: DC | PRN
  Administered 2018-09-07: 500 mL

## 2018-09-07 MED ORDER — LINACLOTIDE 145 MCG PO CAPS
290.0000 ug | ORAL_CAPSULE | Freq: Every day | ORAL | Status: DC
  Administered 2018-09-08 – 2018-09-10 (×2): 290 ug via ORAL
  Filled 2018-09-07 (×3): qty 2

## 2018-09-07 MED ORDER — FENTANYL CITRATE (PF) 250 MCG/5ML IJ SOLN
INTRAMUSCULAR | Status: DC | PRN
  Administered 2018-09-07 (×2): 50 ug via INTRAVENOUS
  Administered 2018-09-07: 150 ug via INTRAVENOUS

## 2018-09-07 MED ORDER — PHENYLEPHRINE 40 MCG/ML (10ML) SYRINGE FOR IV PUSH (FOR BLOOD PRESSURE SUPPORT)
PREFILLED_SYRINGE | INTRAVENOUS | Status: AC
  Filled 2018-09-07: qty 10

## 2018-09-07 MED ORDER — PROMETHAZINE HCL 25 MG/ML IJ SOLN
INTRAMUSCULAR | Status: DC | PRN
  Administered 2018-09-07: 12 mg via INTRAVENOUS

## 2018-09-07 MED ORDER — FLEET ENEMA 7-19 GM/118ML RE ENEM: 1.0000 | ENEMA | Freq: Once | RECTAL | Status: DC | PRN

## 2018-09-07 MED ORDER — EST ESTROGENS-METHYLTEST 0.625-1.25 MG PO TABS
2.0000 | ORAL_TABLET | Freq: Every day | ORAL | Status: DC
  Administered 2018-09-08 – 2018-09-10 (×3): 2 via ORAL
  Filled 2018-09-07 (×4): qty 2

## 2018-09-07 MED ORDER — THROMBIN 20000 UNITS EX SOLR
CUTANEOUS | Status: AC
  Filled 2018-09-07: qty 20000

## 2018-09-07 MED ORDER — ROCURONIUM BROMIDE 10 MG/ML (PF) SYRINGE
PREFILLED_SYRINGE | INTRAVENOUS | Status: AC
  Filled 2018-09-07: qty 10

## 2018-09-07 MED ORDER — MONTELUKAST SODIUM 10 MG PO TABS
10.0000 mg | ORAL_TABLET | Freq: Every day | ORAL | Status: DC
  Administered 2018-09-08 – 2018-09-10 (×3): 10 mg via ORAL
  Filled 2018-09-07 (×3): qty 1

## 2018-09-07 MED ORDER — GABAPENTIN 600 MG PO TABS
1200.0000 mg | ORAL_TABLET | Freq: Two times a day (BID) | ORAL | Status: DC
  Administered 2018-09-07 – 2018-09-10 (×6): 1200 mg via ORAL
  Filled 2018-09-07 (×6): qty 2

## 2018-09-07 MED ORDER — SODIUM CHLORIDE (PF) 0.9 % IJ SOLN
INTRAMUSCULAR | Status: AC
  Filled 2018-09-07: qty 10

## 2018-09-07 MED ORDER — MEPERIDINE HCL 25 MG/ML IJ SOLN: 6.2500 mg | INTRAMUSCULAR | Status: DC | PRN

## 2018-09-07 MED ORDER — DIPHENHYDRAMINE HCL 50 MG/ML IJ SOLN
INTRAMUSCULAR | Status: AC
  Filled 2018-09-07: qty 1

## 2018-09-07 MED ORDER — SODIUM CHLORIDE 0.9% FLUSH
3.0000 mL | Freq: Two times a day (BID) | INTRAVENOUS | Status: DC
  Administered 2018-09-08 – 2018-09-09 (×4): 3 mL via INTRAVENOUS

## 2018-09-07 MED ORDER — DOCUSATE SODIUM 100 MG PO CAPS
100.0000 mg | ORAL_CAPSULE | Freq: Two times a day (BID) | ORAL | Status: DC
  Administered 2018-09-07 – 2018-09-09 (×3): 100 mg via ORAL
  Filled 2018-09-07 (×5): qty 1

## 2018-09-07 MED ORDER — METHOCARBAMOL 1000 MG/10ML IJ SOLN
500.0000 mg | Freq: Four times a day (QID) | INTRAVENOUS | Status: DC | PRN
  Filled 2018-09-07: qty 5

## 2018-09-07 MED ORDER — ACETAMINOPHEN 650 MG RE SUPP: 650.0000 mg | RECTAL | Status: DC | PRN

## 2018-09-07 MED ORDER — PREDNISONE 5 MG PO TABS
10.0000 mg | ORAL_TABLET | Freq: Every day | ORAL | Status: DC
  Administered 2018-09-08 – 2018-09-10 (×3): 10 mg via ORAL
  Filled 2018-09-07 (×3): qty 2

## 2018-09-07 MED ORDER — FLUTICASONE PROPIONATE 50 MCG/ACT NA SUSP
2.0000 | Freq: Every day | NASAL | Status: DC
  Administered 2018-09-09: 2 via NASAL
  Filled 2018-09-07: qty 16

## 2018-09-07 MED ORDER — PROPOFOL 10 MG/ML IV BOLUS
INTRAVENOUS | Status: AC
  Filled 2018-09-07: qty 20

## 2018-09-07 MED ORDER — ALBUMIN HUMAN 5 % IV SOLN
INTRAVENOUS | Status: DC | PRN
  Administered 2018-09-07 (×2): via INTRAVENOUS

## 2018-09-07 MED ORDER — MIDAZOLAM HCL 2 MG/2ML IJ SOLN
INTRAMUSCULAR | Status: AC
  Filled 2018-09-07: qty 2

## 2018-09-07 MED ORDER — PROMETHAZINE HCL 25 MG PO TABS: 25.0000 mg | ORAL_TABLET | Freq: Four times a day (QID) | ORAL | Status: DC | PRN

## 2018-09-07 MED ORDER — LUBIPROSTONE 8 MCG PO CAPS
8.0000 ug | ORAL_CAPSULE | Freq: Two times a day (BID) | ORAL | Status: DC
  Administered 2018-09-08 – 2018-09-10 (×3): 8 ug via ORAL
  Filled 2018-09-07 (×7): qty 1

## 2018-09-07 MED ORDER — INSULIN ASPART 100 UNIT/ML IV SOLN
10.0000 [IU] | Freq: Once | INTRAVENOUS | Status: AC
  Administered 2018-09-07: 10 [IU] via INTRAVENOUS
  Administered 2018-09-07: 11:00:00 5 [IU] via INTRAVENOUS
  Filled 2018-09-07: qty 0.1

## 2018-09-07 MED ORDER — SODIUM CHLORIDE 0.9 % IV SOLN: 250.0000 mL | INTRAVENOUS | Status: DC

## 2018-09-07 MED ORDER — KETOROLAC TROMETHAMINE 15 MG/ML IJ SOLN
7.5000 mg | Freq: Four times a day (QID) | INTRAMUSCULAR | Status: AC
  Administered 2018-09-07 – 2018-09-08 (×4): 7.5 mg via INTRAVENOUS
  Filled 2018-09-07 (×4): qty 1

## 2018-09-07 MED ORDER — THROMBIN 5000 UNITS EX SOLR
OROMUCOSAL | Status: DC | PRN
  Administered 2018-09-07 (×3): 5 mL

## 2018-09-07 MED ORDER — LORAZEPAM 0.5 MG PO TABS
0.5000 mg | ORAL_TABLET | Freq: Every evening | ORAL | Status: DC | PRN
  Filled 2018-09-07: qty 2

## 2018-09-07 MED ORDER — BUPIVACAINE HCL (PF) 0.5 % IJ SOLN
INTRAMUSCULAR | Status: AC
  Filled 2018-09-07: qty 30

## 2018-09-07 MED ORDER — BUSPIRONE HCL 5 MG PO TABS
20.0000 mg | ORAL_TABLET | Freq: Two times a day (BID) | ORAL | Status: DC
  Administered 2018-09-07 – 2018-09-10 (×6): 20 mg via ORAL
  Filled 2018-09-07 (×6): qty 4

## 2018-09-07 MED ORDER — 0.9 % SODIUM CHLORIDE (POUR BTL) OPTIME
TOPICAL | Status: DC | PRN
  Administered 2018-09-07 (×2): 1000 mL

## 2018-09-07 MED ORDER — HYDROMORPHONE HCL 1 MG/ML IJ SOLN
0.2500 mg | INTRAMUSCULAR | Status: DC | PRN
  Administered 2018-09-07 (×2): 0.25 mg via INTRAVENOUS

## 2018-09-07 MED ORDER — BENRALIZUMAB 30 MG/ML ~~LOC~~ SOSY: 30.0000 mg | PREFILLED_SYRINGE | Freq: Once | SUBCUTANEOUS | Status: DC

## 2018-09-07 MED ORDER — SODIUM CHLORIDE 0.9% FLUSH: 3.0000 mL | INTRAVENOUS | Status: DC | PRN

## 2018-09-07 MED ORDER — CEFAZOLIN SODIUM-DEXTROSE 2-4 GM/100ML-% IV SOLN
2.0000 g | Freq: Three times a day (TID) | INTRAVENOUS | Status: AC
  Administered 2018-09-07 – 2018-09-08 (×2): 2 g via INTRAVENOUS
  Filled 2018-09-07 (×2): qty 100

## 2018-09-07 MED ORDER — INSULIN ASPART 100 UNIT/ML ~~LOC~~ SOLN
10.0000 [IU] | Freq: Once | SUBCUTANEOUS | Status: DC
  Filled 2018-09-07: qty 0.1

## 2018-09-07 MED ORDER — LIDOCAINE 2% (20 MG/ML) 5 ML SYRINGE
INTRAMUSCULAR | Status: AC
  Filled 2018-09-07: qty 5

## 2018-09-07 MED ORDER — ALUM & MAG HYDROXIDE-SIMETH 200-200-20 MG/5ML PO SUSP: 30.0000 mL | Freq: Four times a day (QID) | ORAL | Status: DC | PRN

## 2018-09-07 MED ORDER — ALBUTEROL SULFATE (2.5 MG/3ML) 0.083% IN NEBU: 2.5000 mg | INHALATION_SOLUTION | RESPIRATORY_TRACT | Status: DC | PRN

## 2018-09-07 MED ORDER — SCOPOLAMINE 1 MG/3DAYS TD PT72
MEDICATED_PATCH | TRANSDERMAL | Status: AC
  Filled 2018-09-07: qty 1

## 2018-09-07 MED ORDER — QUETIAPINE FUMARATE 100 MG PO TABS
100.0000 mg | ORAL_TABLET | Freq: Every day | ORAL | Status: DC
  Administered 2018-09-07 – 2018-09-09 (×3): 100 mg via ORAL
  Filled 2018-09-07 (×3): qty 1

## 2018-09-07 MED ORDER — CEFAZOLIN SODIUM 1 G IJ SOLR
INTRAMUSCULAR | Status: AC
  Filled 2018-09-07: qty 20

## 2018-09-07 MED ORDER — ALBUTEROL SULFATE HFA 108 (90 BASE) MCG/ACT IN AERS: 2.0000 | INHALATION_SPRAY | RESPIRATORY_TRACT | Status: DC | PRN

## 2018-09-07 MED ORDER — PREGABALIN 50 MG PO CAPS
150.0000 mg | ORAL_CAPSULE | Freq: Three times a day (TID) | ORAL | Status: DC
  Administered 2018-09-07 – 2018-09-10 (×8): 150 mg via ORAL
  Filled 2018-09-07 (×8): qty 1

## 2018-09-07 MED ORDER — LIDOCAINE-EPINEPHRINE 1 %-1:100000 IJ SOLN
INTRAMUSCULAR | Status: DC | PRN
  Administered 2018-09-07: 10 mL

## 2018-09-07 MED ORDER — HYDROCODONE-ACETAMINOPHEN 10-325 MG PO TABS
1.0000 | ORAL_TABLET | Freq: Four times a day (QID) | ORAL | Status: DC | PRN
  Administered 2018-09-07 – 2018-09-10 (×5): 1 via ORAL
  Filled 2018-09-07 (×5): qty 1

## 2018-09-07 MED ORDER — ACETAMINOPHEN 325 MG PO TABS
650.0000 mg | ORAL_TABLET | ORAL | Status: DC | PRN
  Administered 2018-09-08 – 2018-09-09 (×2): 650 mg via ORAL
  Filled 2018-09-07 (×2): qty 2

## 2018-09-07 MED ORDER — POTASSIUM CHLORIDE ER 20 MEQ PO TBCR: 20.0000 meq | EXTENDED_RELEASE_TABLET | Freq: Every day | ORAL | Status: DC

## 2018-09-07 MED ORDER — PHENYLEPHRINE HCL (PRESSORS) 10 MG/ML IV SOLN
INTRAVENOUS | Status: DC | PRN
  Administered 2018-09-07 (×2): 80 ug via INTRAVENOUS

## 2018-09-07 MED ORDER — INSULIN ASPART 100 UNIT/ML IV SOLN
10.0000 [IU] | Freq: Once | INTRAVENOUS | Status: DC
  Filled 2018-09-07: qty 0.1

## 2018-09-07 MED ORDER — LORATADINE 10 MG PO TABS
10.0000 mg | ORAL_TABLET | Freq: Every evening | ORAL | Status: DC
  Administered 2018-09-07 – 2018-09-09 (×3): 10 mg via ORAL
  Filled 2018-09-07 (×3): qty 1

## 2018-09-07 MED ORDER — POLYETHYLENE GLYCOL 3350 17 G PO PACK: 17.0000 g | PACK | Freq: Every day | ORAL | Status: DC | PRN

## 2018-09-07 SURGICAL SUPPLY — 87 items
APPLIER CLIP 11 MED OPEN (CLIP) ×4
BAG DECANTER FOR FLEXI CONT (MISCELLANEOUS) ×2 IMPLANT
BASE TI IMP 6X42X30 15D (Cage) ×1 IMPLANT
BASKET BONE COLLECTION (BASKET) IMPLANT
BENZOIN TINCTURE PRP APPL 2/3 (GAUZE/BANDAGES/DRESSINGS) ×2 IMPLANT
BOLT BASE TI 5X20 VARIABLE (Bolt) ×6 IMPLANT
BONE MATRIX OSTEOCEL PRO MED (Bone Implant) ×2 IMPLANT
BUR BARREL STRAIGHT FLUTE 4.0 (BURR) IMPLANT
BUR MATCHSTICK NEURO 3.0 LAGG (BURR) ×1 IMPLANT
CANISTER SUCT 3000ML PPV (MISCELLANEOUS) ×1 IMPLANT
CLIP APPLIE 11 MED OPEN (CLIP) ×2 IMPLANT
CLIP LIGATING EXTRA MED SLVR (CLIP) ×2 IMPLANT
CLIP LIGATING EXTRA SM BLUE (MISCELLANEOUS) ×2 IMPLANT
COVER WAND RF STERILE (DRAPES) ×2 IMPLANT
DECANTER SPIKE VIAL GLASS SM (MISCELLANEOUS) ×2 IMPLANT
DERMABOND ADVANCED (GAUZE/BANDAGES/DRESSINGS) ×1
DERMABOND ADVANCED .7 DNX12 (GAUZE/BANDAGES/DRESSINGS) ×1 IMPLANT
DRAPE C-ARM 42X72 X-RAY (DRAPES) ×4 IMPLANT
DRAPE LAPAROTOMY 100X72X124 (DRAPES) ×2 IMPLANT
DURAPREP 26ML APPLICATOR (WOUND CARE) ×2 IMPLANT
ELECT BLADE 4.0 EZ CLEAN MEGAD (MISCELLANEOUS) ×2
ELECT REM PT RETURN 9FT ADLT (ELECTROSURGICAL) ×2
ELECTRODE BLDE 4.0 EZ CLN MEGD (MISCELLANEOUS) ×2 IMPLANT
ELECTRODE REM PT RTRN 9FT ADLT (ELECTROSURGICAL) ×1 IMPLANT
GAUZE 4X4 16PLY RFD (DISPOSABLE) IMPLANT
GAUZE SPONGE 4X4 12PLY STRL (GAUZE/BANDAGES/DRESSINGS) ×2 IMPLANT
GLOVE BIO SURGEON STRL SZ8 (GLOVE) ×1 IMPLANT
GLOVE BIO SURGEON STRL SZ8.5 (GLOVE) ×1 IMPLANT
GLOVE BIOGEL PI IND STRL 8.5 (GLOVE) ×1 IMPLANT
GLOVE BIOGEL PI INDICATOR 8.5 (GLOVE) ×1
GLOVE ECLIPSE 8.5 STRL (GLOVE) ×4 IMPLANT
GLOVE SS BIOGEL STRL SZ 7.5 (GLOVE) ×1 IMPLANT
GLOVE SUPERSENSE BIOGEL SZ 7.5 (GLOVE) ×1
GOWN STRL REUS W/ TWL LRG LVL3 (GOWN DISPOSABLE) ×1 IMPLANT
GOWN STRL REUS W/ TWL XL LVL3 (GOWN DISPOSABLE) ×1 IMPLANT
GOWN STRL REUS W/TWL 2XL LVL3 (GOWN DISPOSABLE) ×2 IMPLANT
GOWN STRL REUS W/TWL LRG LVL3 (GOWN DISPOSABLE) ×2
GOWN STRL REUS W/TWL XL LVL3 (GOWN DISPOSABLE) ×2
HEMOSTAT POWDER KIT SURGIFOAM (HEMOSTASIS) ×3 IMPLANT
IMPL BASE TI 6X38X28MM 15DE (Neuro Prosthesis/Implant) IMPLANT
IMPLANT BASE TI 6X38X28MM 15DE (Neuro Prosthesis/Implant) ×2 IMPLANT
INSERT FOGARTY 61MM (MISCELLANEOUS) IMPLANT
INSERT FOGARTY SM (MISCELLANEOUS) IMPLANT
KIT BASIN OR (CUSTOM PROCEDURE TRAY) ×2 IMPLANT
KIT TURNOVER KIT B (KITS) ×2 IMPLANT
LOOP VESSEL MAXI BLUE (MISCELLANEOUS) IMPLANT
LOOP VESSEL MINI RED (MISCELLANEOUS) IMPLANT
NDL HYPO 25X1 1.5 SAFETY (NEEDLE) IMPLANT
NDL SPNL 18GX3.5 QUINCKE PK (NEEDLE) ×1 IMPLANT
NEEDLE HYPO 25X1 1.5 SAFETY (NEEDLE) ×2 IMPLANT
NEEDLE SPNL 18GX3.5 QUINCKE PK (NEEDLE) ×2 IMPLANT
NS IRRIG 1000ML POUR BTL (IV SOLUTION) ×3 IMPLANT
PACK LAMINECTOMY NEURO (CUSTOM PROCEDURE TRAY) ×2 IMPLANT
PAD ARMBOARD 7.5X6 YLW CONV (MISCELLANEOUS) ×2 IMPLANT
PATTIES SURGICAL 1X1 (DISPOSABLE) ×1 IMPLANT
SPONGE INTESTINAL PEANUT (DISPOSABLE) ×7 IMPLANT
SPONGE LAP 18X18 RF (DISPOSABLE) ×2 IMPLANT
SPONGE LAP 4X18 RFD (DISPOSABLE) IMPLANT
SPONGE SURGIFOAM ABS GEL 100 (HEMOSTASIS) ×2 IMPLANT
STAPLER SKIN PROX WIDE 3.9 (STAPLE) ×2 IMPLANT
STAPLER VISISTAT 35W (STAPLE) ×2 IMPLANT
STRIP CLOSURE SKIN 1/2X4 (GAUZE/BANDAGES/DRESSINGS) ×4 IMPLANT
SUT PDS AB 1 CTX 36 (SUTURE) ×1 IMPLANT
SUT PROLENE 4 0 RB 1 (SUTURE)
SUT PROLENE 4-0 RB1 .5 CRCL 36 (SUTURE) IMPLANT
SUT PROLENE 5 0 C1 (SUTURE) ×4 IMPLANT
SUT PROLENE 5 0 CC1 (SUTURE) IMPLANT
SUT PROLENE 6 0 C 1 30 (SUTURE) ×2 IMPLANT
SUT PROLENE 6 0 CC (SUTURE) IMPLANT
SUT SILK 0 TIES 10X30 (SUTURE) ×2 IMPLANT
SUT SILK 2 0 TIES 10X30 (SUTURE) ×2 IMPLANT
SUT SILK 2 0SH CR/8 30 (SUTURE) IMPLANT
SUT SILK 3 0 TIES 10X30 (SUTURE) ×2 IMPLANT
SUT SILK 3 0 TIES 17X18 (SUTURE)
SUT SILK 3 0SH CR/8 30 (SUTURE) IMPLANT
SUT SILK 3-0 18XBRD TIE BLK (SUTURE) IMPLANT
SUT VIC AB 0 CT1 27 (SUTURE)
SUT VIC AB 0 CT1 27XBRD ANBCTR (SUTURE) ×1 IMPLANT
SUT VIC AB 1 CT1 18XBRD ANBCTR (SUTURE) ×1 IMPLANT
SUT VIC AB 1 CT1 8-18 (SUTURE)
SUT VIC AB 2-0 CP2 18 (SUTURE) ×3 IMPLANT
SUT VIC AB 3-0 SH 8-18 (SUTURE) ×3 IMPLANT
SUT VICRYL 4-0 PS2 18IN ABS (SUTURE) ×1 IMPLANT
TOWEL GREEN STERILE (TOWEL DISPOSABLE) ×3 IMPLANT
TOWEL GREEN STERILE FF (TOWEL DISPOSABLE) ×4 IMPLANT
TRAY FOLEY MTR SLVR 16FR STAT (SET/KITS/TRAYS/PACK) ×2 IMPLANT
WATER STERILE IRR 1000ML POUR (IV SOLUTION) ×2 IMPLANT

## 2018-09-07 NOTE — Progress Notes (Addendum)
Inpatient Diabetes Program Recommendations  AACE/ADA: New Consensus Statement on Inpatient Glycemic Control (2015)  Target Ranges:  Prepandial:   less than 140 mg/dL      Peak postprandial:   less than 180 mg/dL (1-2 hours)      Critically ill patients:  140 - 180 mg/dL   Results for RAKAYLA, RICKLEFS (MRN [id number]) as of 09/07/2018 14:03  Ref. 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  RN told me pt is sleepy post-op from Phenergan she received for post-op N&V.  Pt is easily arousable and RN Lovena Le told me she believes pt will be able to manage her insulin pump post-op.  Pt currently wearing insulin pump and RN told me that her basal rates were reduced to 60% for surgery.   Currently has Signed and Held orders for Novolog Resistant Correction Scale/ SSI (0-20 units) TID AC.  Alerted PACU RN that pt won't need Novolog SSI if she is allowed to use her insulin pump post-op.  Asked RN Lovena Le (or pt's primary RN) to please contact Dr. Ellene Route and get orders that will allow pt to use her personal insulin pump in the hospital.  Will see pt and assess her insulin pump settings once she is assigned to a room (may be tomorrow 07/28).  Reminded RN Lovena Le that pt needs to be able to manage her insulin pump independently.   Addendum: Care Order  entered by Lovena Le, RN to allow pt to use insulin pump in hospital per Dr. Ellene Route.  Insulin pump order set entered into [facility] per Insulin Pump Policy/ Protocol.    --Will follow patient during hospitalization--  Wyn Quaker RN, MSN, CDE Diabetes Coordinator Inpatient Glycemic Control Team Team Pager: [phone] (8a-5p)

## 2018-09-07 NOTE — H&P (Signed)
Carolyn Benson is an 63 y.o. female.   Chief Complaint: Back and bilateral leg pain HPI: Carolyn Benson is a 63 year old individual who is had intermittent problems with her back over the years.  Back in 1980s Dr. Rosalie DoctorSteve Robinson did a laminectomy and discectomy.  Then about 10 years later she had problems that were treated by Dr. Archie BalboaBlum [place] with a minimally invasive procedure.  She did well for a number of years but here in the last 2 years has had increasing back and bilateral leg pain with weakness in her lower extremities.  She has advanced degenerative changes at L4-5 and L5-S1 and after trying a number of conservative treatments for the last 2 years including injections by Dr. how long a couple of injections by me and extensive physical therapy she has not been able to overcome this back pain.  After careful consideration of her options I advised a two-level anterior lumbar interbody arthrodesis at L4-5 and L5-S1.  She is now admitted for that procedure.  Past Medical History:  Diagnosis Date  . Anxiety   . Asthma   . Back pain   . Depression   . Diabetes mellitus    type 2 with insulin pump  . GERD (gastroesophageal reflux disease)   . Hypertension   . Hypokalemia   . Lumbar spondylosis    and degenerative disc disease causing mild impingement at L4-5 and L5-S1.  Marland Kitchen. PONV (postoperative nausea and vomiting)    Severe  . Sleep apnea    Study > 10-15 yeara ago, patient refuesed CPAP  . Weakness of back    and legs    Past Surgical History:  Procedure Laterality Date  . ABDOMINAL HYSTERECTOMY    . APPENDECTOMY    . BACK SURGERY     Right Laminectomy at L4-5  . CHOLECYSTECTOMY    . NECK SURGERY     x2    Family History  Problem Relation Age of Onset  . Asthma Mother   . Allergic rhinitis Father   . Asthma Brother   . Angioedema Neg Hx   . Atopy Neg Hx   . Eczema Neg Hx   . Immunodeficiency Neg Hx    Social History:  reports that she has never smoked. She  has never used smokeless tobacco. She reports that she does not drink alcohol or use drugs.  Allergies:  Allergies  Allergen Reactions  . Ibuprofen Swelling  . Ciprofloxacin Rash  . Clarithromycin Nausea And Vomiting  . Codeine Rash    Swelling  . Doxycycline Nausea And Vomiting  . Metoclopramide Rash    PT DOES NOT KNOW  . Omnipaque [Iohexol] Other (See Comments)    Kidneys bleed Kidneys bleed  . Sulfasalazine Rash  . Phenylpropanol Nausea And Vomiting  . Butorphanol Nausea Only  . Butorphanol Tartrate Nausea And Vomiting and Nausea Only  . Darvocet [Propoxyphene N-Acetaminophen] Nausea Only  . Other Rash    Fluoroquinolones  . Ranitidine Hcl Rash  . Stadol [Butorphanol Tartrate] Nausea Only  . Sulfa Antibiotics Rash    Facility-Administered Medications Prior to Admission  Medication Dose Route Frequency Provider Last Rate Last Dose  . Benralizumab SOSY 30 mg  30 mg Subcutaneous Once Ambs, Norvel RichardsAnne M, FNP      . predniSONE (DELTASONE) tablet 10 mg  10 mg Oral Q breakfast Bobbitt, Heywood Ilesalph Carter, MD       Medications Prior to Admission  Medication Sig Dispense Refill  . acyclovir (ZOVIRAX) 400 MG tablet Take  400 mg by mouth 2 (two) times daily.      Marland Kitchen. albuterol (PROVENTIL HFA;VENTOLIN HFA) 108 (90 Base) MCG/ACT inhaler Inhale 2 puffs into the lungs every 4 (four) hours as needed for wheezing or shortness of breath. 3 Inhaler 0  . atenolol (TENORMIN) 25 MG tablet Take 25 mg by mouth daily.      . budesonide-formoterol (SYMBICORT) 160-4.5 MCG/ACT inhaler Inhale 2 puffs into the lungs 2 (two) times daily. 1 Inhaler 5  . busPIRone (BUSPAR) 10 MG tablet Take 20 mg by mouth 2 (two) times daily.    Marland Kitchen. dexlansoprazole (DEXILANT) 60 MG capsule Take 60 mg by mouth daily.      . Diethylpropion HCl CR 75 MG TB24 Take 75 mg by mouth daily.     Marland Kitchen. doxepin (SINEQUAN) 25 MG capsule Take 25 mg by mouth at bedtime.      Marland Kitchen. EPINEPHrine (EPIPEN 2-PAK) 0.3 mg/0.3 mL IJ SOAJ injection Inject 0.3 mLs (0.3  mg total) into the muscle once. 2 Device 1  . escitalopram (LEXAPRO) 10 MG tablet Take 10 mg by mouth daily.     Marland Kitchen. estrogens-methylTEST (ESTRATEST) 1.25-2.5 MG tablet Take 1 tablet by mouth daily.     . fluticasone (FLONASE) 50 MCG/ACT nasal spray USE 2 SPRAYS IN EACH NOSTRIL EVERY DAY (Patient taking differently: Place 2 sprays into both nostrils daily. ) 48 g 2  . gabapentin (NEURONTIN) 600 MG tablet Take 1,200 mg by mouth 2 (two) times daily.      Marland Kitchen. guaifenesin (HUMIBID E) 400 MG TABS Take 400 mg by mouth 2 (two) times daily.      Marland Kitchen. HYDROcodone-acetaminophen (NORCO) 10-325 MG tablet Take 1 tablet by mouth 4 (four) times daily as needed for moderate pain.     Marland Kitchen. insulin aspart (NOVOLOG) 100 UNIT/ML injection Use as directed in insulin pump.  MDD: 135 units/day    . Insulin Infusion Pump DEVI 80 Units by Insulin Pump route once a week.    . levocetirizine (XYZAL) 5 MG tablet Take 5 mg by mouth every evening.     Marland Kitchen. levothyroxine (SYNTHROID, LEVOTHROID) 50 MCG tablet Take 50 mcg by mouth daily before breakfast.     . linaclotide (LINZESS) 290 MCG CAPS capsule Take 290 mcg by mouth daily before breakfast.     . LORazepam (ATIVAN) 0.5 MG tablet Take 0.5-1 mg by mouth at bedtime as needed for anxiety.     Marland Kitchen. lubiprostone (AMITIZA) 8 MCG capsule Take 8 mcg by mouth 2 (two) times daily with a meal.      . metolazone (ZAROXOLYN) 2.5 MG tablet Take 2.5 mg by mouth 2 (two) times daily.      . metoprolol succinate (TOPROL-XL) 25 MG 24 hr tablet Take 25 mg by mouth daily.     . montelukast (SINGULAIR) 10 MG tablet Take 10 mg by mouth daily.      . Potassium Chloride ER 20 MEQ TBCR Take 20 mEq by mouth daily.    . pregabalin (LYRICA) 150 MG capsule Take 150 mg by mouth 3 (three) times daily.   3  . promethazine (PHENERGAN) 25 MG tablet Take 25 mg by mouth 3 (three) times daily as needed.  0  . QUEtiapine (SEROQUEL) 100 MG tablet Take 100 mg by mouth at bedtime.    . rosuvastatin (CRESTOR) 10 MG tablet Take 10  mg by mouth daily.     . sertraline (ZOLOFT) 50 MG tablet Take 50 mg by mouth daily.     .Marland Kitchen  topiramate (TOPAMAX) 25 MG tablet Take 25 mg by mouth daily.     Marland Kitchen albuterol (PROVENTIL) (2.5 MG/3ML) 0.083% nebulizer solution Take 3 mLs (2.5 mg total) by nebulization every 4 (four) hours as needed for wheezing or shortness of breath. 75 mL 1  . glucose blood (CONTOUR NEXT TEST) test strip by Misc.(Non-Drug; Combo Route) route 4 times daily.      No results found for this or any previous visit (from the past 48 hour(s)). No results found.  Review of Systems  Constitutional: Negative.   HENT: Negative.   Eyes: Negative.   Respiratory: Negative.   Cardiovascular: Negative.   Gastrointestinal: Negative.   Genitourinary: Negative.   Musculoskeletal: Positive for back pain.  Skin: Negative.   Neurological:       Back and bilateral leg pain.  Endo/Heme/Allergies: Negative.   Psychiatric/Behavioral: Negative.     Blood pressure (!) 157/78, pulse 100, temperature 97.7 F (36.5 C), temperature source Oral, resp. rate 20, height 5\' 3"  (1.6 m), weight 102.7 kg, SpO2 97 %. Physical Exam  Constitutional: She is oriented to person, place, and time. She appears well-developed and well-nourished.  HENT:  Head: Normocephalic and atraumatic.  Eyes: Pupils are equal, round, and reactive to light. Conjunctivae and EOM are normal.  Neck: Normal range of motion. Neck supple.  Cardiovascular: Normal rate and regular rhythm.  Respiratory: Effort normal and breath sounds normal.  GI: Soft. Bowel sounds are normal.  Musculoskeletal:     Comments: Positive straight leg raising bilaterally Patrick's maneuver is negative bilaterally  Neurological: She is alert and oriented to person, place, and time.  Absent deep tendon reflexes in both lower extremities.  Moderate weakness in the right tibialis anterior 4-5 mild weakness on the left side at 4+ out of 5.  Gastroc strength is symmetric bilaterally.  Cranial  nerve examination is within limits of normal.  Upper extremity strength and reflexes are intact.  Skin: Skin is warm and dry.  Psychiatric: She has a normal mood and affect. Her behavior is normal. Judgment and thought content normal.     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  Plan: Anterior lumbar interbody arthrodesis L4-5 and L5-S1.  Earleen Newport, MD 09/07/2018, 7:35 AM

## 2018-09-07 NOTE — Op Note (Signed)
Date of surgery: 09/07/2018 Preoperative diagnosis: Spondylosis with lumbar radiculopathy L4-5 and L5-S1, advanced degenerative disc disease L4-5 and L5-S1. Postoperative diagnosis: Same Procedure: Anterior lumbar decompression and arthrodesis with interbody technique using peek/titanium spacer measuring 14 x 42 x 30 with 15 degrees of lordosis at L4-5 14 x 38 x 28 with 15 degrees of lordosis at L5-S1.  Allograft arthrodesis.  Ostia cell. Surgeon: Carolyn Benson First assistant: Carolyn Pies, MD Approach surgeon Dr. Curt Jews MD Anesthesia General endotracheal Indications: Carolyn Benson is a 63 year old individual who has had significant back and leg pain for a period of about 2 years time.  She has had previous surgery by Dr. Anderson Benson in the 80s and then by Dr. Oren Benson about 15 years ago she notes that the recurrent pain that she has is not been responsive to conservative efforts including lumbar injections by Dr. Lavone Benson long and myself.  After failing efforts at conservative management she was advised regarding surgical decompression and arthrodesis.  Procedure: Carolyn Benson early had performed the approach through a paramedian incision in the left lower abdomen and once he secured the opening at L4-L5 I started the procedure by opening the anterior longitudinal ligament and then removing some ventral osteophytes from the interspace I entered the disc space and removed a moderate quantity of severely degenerated and desiccated disc material.  Interspace was spread carefully and the endplates were then curettaged smoothed.  As the region of the posterior longitudinal ligament was reached significant degenerative change was encountered.  There is also noted to be some subligamentously herniated disc material and this was removed.  Common dural tube was decompressed from side to side and in the end hemostasis was achieved the interspace was then sized for appropriate size spacer and was felt that at L4-5  a 14 x 42 x 30 mm spacer with 15 degrees lordosis would fit well this was then filled with ostia cell allograft and placed into the interspace under fluoroscopic visualization it was secured with three 5.0 x 20 mm screws to being placed inferiorly and L5 and one being placed superiorly into L4.  Then the retractors were moved to the L5-S1 position and the similar process was carried out removing ventral osteophytes and the anterior longitudinal ligament was open the disc was noted to be severely degenerated and desiccated and a minimal amount of disc material was removed but the interspace could be spread open to allow good decompression of the lateral recesses.  Ultimately was felt that a 14 mm x 38 mm x 28 mm spacer with 15 degrees of lordosis would fit into this interval after the decompression had been achieved this was filled with 5 cc of Oxycel also.  The retractors were removed after securing with the 3 screws in a similar fashion and once hemostasis was checked in the retroperitoneal space the anterior rectus sheath was closed with #1 Prolene in a running fashion 2-0 Vicryl was used in subcutaneous tissues and 3-0 Vicryl subcuticularly Dermabond was used on the skin blood loss for the procedure was estimated 750 cc with 300 cc of Cell Saver blood being returned to the patient.

## 2018-09-07 NOTE — Anesthesia Procedure Notes (Signed)
Procedure Name: Intubation Date/Time: 09/07/2018 7:50 AM Performed by: Kathryne Hitch, CRNA Pre-anesthesia Checklist: Patient identified, Emergency Drugs available, Suction available and Patient being monitored Patient Re-evaluated:Patient Re-evaluated prior to induction Oxygen Delivery Method: Circle system utilized Preoxygenation: Pre-oxygenation with 100% oxygen Induction Type: IV induction Ventilation: Mask ventilation without difficulty Laryngoscope Size: Miller and 2 Grade View: Grade I Tube type: Oral Tube size: 7.0 mm Number of attempts: 1 Airway Equipment and Method: Stylet and Oral airway Placement Confirmation: ETT inserted through vocal cords under direct vision,  positive ETCO2 and breath sounds checked- equal and bilateral Secured at: 21 cm Tube secured with: Tape Dental Injury: Teeth and Oropharynx as per pre-operative assessment

## 2018-09-07 NOTE — Op Note (Signed)
° ° °  OPERATIVE REPORT  DATE OF SURGERY: 09/07/2018  PATIENT: Carolyn Benson, 63 y.o. female MRN: [id number]  DOB: February 01, 1956  PRE-OPERATIVE DIAGNOSIS: Degenerative disc disease  POST-OPERATIVE DIAGNOSIS:  Same  PROCEDURE: Anterior exposure for L4-5 and L5-S1 disc fusion  SURGEON:  Curt Jews, M.D.  Co-surgeon with Dr. Kristeen Miss  ANESTHESIA: General  EBL: per anesthesia record  Total I/O In: 4098 [I.V.:1800; Blood:300; IV Piggyback:500] Out: 1150 [Urine:400; Blood:750]  BLOOD ADMINISTERED: none  DRAINS: none  SPECIMEN: none  COUNTS CORRECT:  YES  PATIENT DISPOSITION:  PACU - hemodynamically stable  PROCEDURE DETAILS: The patient was taken the operating placed supine position where the area of the abdomen was prepped and draped in usual sterile fashion.  Crosstable lateral C arm was used to identify the level of the L4-5 and L5-S1 disc and this was marked on the anterior surface of the abdomen.  A left paramedian incision was made over this level and carried down through the subcutaneous fat to the level of the anterior rectus sheath.  The anterior rectus sheath was opened with electrocautery in line with the skin incision.  The rectus muscle was mobilized circumferentially.  The retroperitoneal space was entered in the left lower quadrant and blunt dissection was continued laterally.  The rectus sheath was opened laterally and the continued blunt dissection was undertaken to mobilize intraperitoneal contents in the left ureter to the right.  The left ureter was identified and mobilized to the right.  The L5-S1 disc was approached below the level of the aortic bifurcation.  Middle sacral artery and vein were clipped and divided.  Blunt dissection over the L4-5 disc was used to give adequate exposure for fusion.  Attention was then turned to the 4 5 disc.  The aorta and cava were mobilized to the right.  The patient had 2 large iliolumbar branch vein branches and these were  ligated with 2-0 silk ties and divided.  Patient also had large lumbar segmental branch above this and this was ligated and divided.  Blunt dissection over the 4 5 disc was continued to give adequate exposure for discectomy and fusion.  The Thompson retractor was placed on the field and the reverse lip blade was positioned to the right of the L4-5 disc.  On placement of this there was some bleeding in the left lower quadrant and 1 of the iliolumbar vein ties required additional 5-0 Prolene suture for hemostasis.  The 200 length blades were positioned to the right and left of the disc and the 190 malleable blades were positioned superiorly and inferiorly.  Spinal needle was placed in the 4 5 disc and C-arm was brought back onto the field to confirm that this was the appropriate level.  The remainder the procedure will be dictated as a separate note by Dr. Magda Kiel, M.D., [facility] 09/07/2018 3:52 PM

## 2018-09-07 NOTE — Anesthesia Postprocedure Evaluation (Signed)
Anesthesia Post Note  Patient: Carolyn Benson  Procedure(s) Performed: Lumbar Four-Five Lumbar Five Sacral One Anterior lumbar interbody fusion (N/A Spine Lumbar) ABDOMINAL EXPOSURE (N/A Spine Lumbar)     Patient location during evaluation: PACU Anesthesia Type: General Level of consciousness: awake and alert Pain management: pain level controlled Vital Signs Assessment: post-procedure vital signs reviewed and stable Respiratory status: spontaneous breathing, nonlabored ventilation, respiratory function stable and patient connected to nasal cannula oxygen Cardiovascular status: blood pressure returned to baseline and stable Postop Assessment: no apparent nausea or vomiting Anesthetic complications: no    Last Vitals:  Vitals:   09/07/18 1548 09/07/18 1602  BP: 124/63 121/65  Pulse: 98 97  Resp: (!) 21 16  Temp:  (!) 36.1 C  SpO2: 95% 97%    Last Pain:  Vitals:   09/07/18 1548  TempSrc:   PainSc: 4                  Daivion Pape DAVID

## 2018-09-07 NOTE — Transfer of Care (Signed)
Immediate Anesthesia Transfer of Care Note  Patient: Carolyn Benson  Procedure(s) Performed: Lumbar Four-Five Lumbar Five Sacral One Anterior lumbar interbody fusion (N/A Spine Lumbar) ABDOMINAL EXPOSURE (N/A Spine Lumbar)  Patient Location: PACU  Anesthesia Type:General  Level of Consciousness: drowsy and patient cooperative  Airway & Oxygen Therapy: Patient Spontanous Breathing and Patient connected to face mask oxygen  Post-op Assessment: Report given to RN and Post -op Vital signs reviewed and stable  Post vital signs: Reviewed and stable  Last Vitals:  Vitals Value Taken Time  BP 137/62 09/07/18 1247  Temp 36.7 C 09/07/18 1247  Pulse 95 09/07/18 1249  Resp 18 09/07/18 1249  SpO2 94 % 09/07/18 1249  Vitals shown include unvalidated device data.  Last Pain:  Vitals:   09/07/18 0640  TempSrc:   PainSc: 9       Patients Stated Pain Goal: 3 (82/42/35 3614)  Complications: No apparent anesthesia complications

## 2018-09-07 NOTE — Progress Notes (Signed)
Inpatient Diabetes Program Recommendations  AACE/ADA: New Consensus Statement on Inpatient Glycemic Control (2015)  Target Ranges:  Prepandial:   less than 140 mg/dL      Peak postprandial:   less than 180 mg/dL (1-2 hours)      Critically ill patients:  140 - 180 mg/dL   Results for CHARLETHA, DALPE (MRN [id number]) as of 09/07/2018 07:42  Ref. 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    --Will follow patient during hospitalization--  Wyn Quaker RN, MSN, CDE Diabetes Coordinator Inpatient Glycemic Control Team Team Pager: [phone] (8a-5p)

## 2018-09-08 ENCOUNTER — Encounter (HOSPITAL_COMMUNITY): Payer: Self-pay | Admitting: Neurological Surgery

## 2018-09-08 LAB — CBC
HCT: 31.9 % — ABNORMAL LOW (ref 36.0–46.0)
Hemoglobin: 10.6 g/dL — ABNORMAL LOW (ref 12.0–15.0)
MCH: 31.9 pg (ref 26.0–34.0)
MCHC: 33.2 g/dL (ref 30.0–36.0)
MCV: 96.1 fL (ref 80.0–100.0)
Platelets: 201 10*3/uL (ref 150–400)
RBC: 3.32 MIL/uL — ABNORMAL LOW (ref 3.87–5.11)
RDW: 12.4 % (ref 11.5–15.5)
WBC: 11.2 10*3/uL — ABNORMAL HIGH (ref 4.0–10.5)
nRBC: 0 % (ref 0.0–0.2)

## 2018-09-08 LAB — GLUCOSE, CAPILLARY
Glucose-Capillary: 132 mg/dL — ABNORMAL HIGH (ref 70–99)
Glucose-Capillary: 152 mg/dL — ABNORMAL HIGH (ref 70–99)
Glucose-Capillary: 162 mg/dL — ABNORMAL HIGH (ref 70–99)
Glucose-Capillary: 216 mg/dL — ABNORMAL HIGH (ref 70–99)
Glucose-Capillary: 98 mg/dL (ref 70–99)

## 2018-09-08 LAB — BASIC METABOLIC PANEL
Anion gap: 8 (ref 5–15)
BUN: 11 mg/dL (ref 8–23)
CO2: 26 mmol/L (ref 22–32)
Calcium: 8.2 mg/dL — ABNORMAL LOW (ref 8.9–10.3)
Chloride: 105 mmol/L (ref 98–111)
Creatinine, Ser: 1.08 mg/dL — ABNORMAL HIGH (ref 0.44–1.00)
GFR calc Af Amer: >60 mL/min (ref >60)
GFR calc non Af Amer: 55 mL/min — ABNORMAL LOW (ref >60)
Glucose, Bld: 104 mg/dL — ABNORMAL HIGH (ref 70–99)
Potassium: 3.5 mmol/L (ref 3.5–5.1)
Sodium: 139 mmol/L (ref 135–145)

## 2018-09-08 MED FILL — Gelatin Absorbable MT Powder: OROMUCOSAL | Qty: 1 | Status: AC

## 2018-09-08 MED FILL — Thrombin For Soln 5000 Unit: CUTANEOUS | Qty: 5000 | Status: AC

## 2018-09-08 NOTE — Progress Notes (Signed)
Inpatient Diabetes Program Recommendations  AACE/ADA: New Consensus Statement on Inpatient Glycemic Control (2015)  Target Ranges:  Prepandial:   less than 140 mg/dL      Peak postprandial:   less than 180 mg/dL (1-2 hours)      Critically ill patients:  140 - 180 mg/dL   Results for Carolyn Benson, Carolyn Benson (MRN [id number]) as of 09/08/2018 12:20  Ref. 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  Pt A&O X 4 and able to independently manage her insulin pump.  CBGs stable.  Temporary basal rate that was set by patient has expired and insulin pump has turned back to normal settings.   Insulin Pump Settings: MN 4.3; 11am 4.3; 3pm 4.3; 6pm 5.0 Total 107.4 units/day Insulin:Carb ratio at MN 2; 9am 3; 6pm 2 Sensitivity MN 10; 11am 10; 8pm 10  Target 90-100 Active insulin: 3 hours  Pt agreeable to fingerstick CBGs by RN with hospital meter.  Pt to alert RN to any insulin boluses given throughout the day.  Reviewed all charting needs and other pump needs with RN as well.  Pt has extra pump supplies and can have family bring more if needed.     --Will follow patient during hospitalization--  Wyn Quaker RN, MSN, CDE Diabetes Coordinator Inpatient Glycemic Control Team Team Pager: [phone] (8a-5p)

## 2018-09-08 NOTE — Progress Notes (Signed)
Patient ID: Carolyn Benson, female   DOB: Aug 02, 1955, 63 y.o.   MRN: [id number] Comfortable this morning.  Had nausea and vomiting in PACU but none since. Reports abdominal soreness.  Reports right leg pain has resolved following surgery Abdomen soft with mild bruising around incision 2+ dorsalis pedis pulses bilaterally Stable postop day 1.  Will not follow actively.  Please call if we can assist

## 2018-09-08 NOTE — Evaluation (Signed)
Physical Therapy Evaluation Patient Details Name: Carolyn Benson MRN: [id number] DOB: 03-Mar-1955 Today's Date: 09/08/2018   History of Present Illness  Pt is a 63 y.o. female s/p L4-S1 ALIF on 09/07/18. PMH includes HTN, asthma, DM, depression, anxiety.  Clinical Impression  Pt presents with an overall decrease in functional mobility secondary to above. PTA, pt independent and lives with family available for support. Educ on precautions, positioning, brace wear and importance of mobility. Today, pt moving well at supervision-level; mobility limited due to brace not being delivered yet (RN in contact with [employer] regarding this). Pt would benefit from continued acute PT services to maximize functional mobility and independence prior to d/c home.     Follow Up Recommendations No PT follow up;Supervision - Intermittent    Equipment Recommendations  None recommended by PT    Recommendations for Other Services       Precautions / Restrictions Precautions Precautions: Fall;Back Precaution Booklet Issued: Yes (comment) Required Braces or Orthoses: Spinal Brace Spinal Brace: Lumbar corset;Applied in sitting position Restrictions Weight Bearing Restrictions: No      Mobility  Bed Mobility Overal bed mobility: Independent             General bed mobility comments: Indep with log roll towards R-side; pt reports doing this at baseline due to pain  Transfers Overall transfer level: Needs assistance Equipment used: Rolling walker (2 wheeled);None Transfers: Sit to/from Stand Sit to Stand: Supervision         General transfer comment: Able to stand from bed, toilet and recliner at supervision-level  Ambulation/Gait Ambulation/Gait assistance: Supervision Gait Distance (Feet): 40 Feet Assistive device: Rolling walker (2 wheeled);None   Gait velocity: Decreased   General Gait Details: Ambulated short distance in room due to brace not being delivered; pt able to trial gait  with and without RW, supervision-level. Moving well without UE support; slow, guarded gait  Stairs            Wheelchair Mobility    Modified Rankin (Stroke Patients Only)       Balance Overall balance assessment: Needs assistance   Sitting balance-Leahy Scale: Good       Standing balance-Leahy Scale: Fair                               Pertinent Vitals/Pain Pain Assessment: Faces Faces Pain Scale: Hurts little more Pain Location: R-side lower back Pain Descriptors / Indicators: Guarding;Sore Pain Intervention(s): Monitored during session    Home Living Family/patient expects to be discharged to:: Private residence Living Arrangements: Spouse/significant other Available Help at Discharge: Family;Available 24 hours/day Type of Home: House Home Access: Stairs to enter Entrance Stairs-Rails: Right Entrance Stairs-Number of Steps: 4-5 Home Layout: One level Home Equipment: Walker - 2 wheels;Cane - single point;Shower seat      Prior Function Level of Independence: Independent         Comments: Drives. Limited by RLE pain prior to surgery, only able to tolerate walking/standing ~10 min bouts     Hand Dominance        Extremity/Trunk Assessment   Upper Extremity Assessment Upper Extremity Assessment: Overall WFL for tasks assessed    Lower Extremity Assessment Lower Extremity Assessment: Overall WFL for tasks assessed       Communication   Communication: No difficulties  Cognition Arousal/Alertness: Awake/alert Behavior During Therapy: WFL for tasks assessed/performed Overall Cognitive Status: Within Functional Limits for tasks assessed  General Comments      Exercises     Assessment/Plan    PT Assessment Patient needs continued PT services  PT Problem List Decreased activity tolerance;Decreased balance;Decreased mobility;Decreased knowledge of precautions;Pain        PT Treatment Interventions DME instruction;Gait training;Stair training;Functional mobility training;Therapeutic activities;Therapeutic exercise;Balance training;Patient/family education    PT Goals (Current goals can be found in the Care Plan section)  Acute Rehab PT Goals Patient Stated Goal: Brace delivered, return home PT Goal Formulation: With patient Time For Goal Achievement: 09/22/18 Potential to Achieve Goals: Good    Frequency Min 5X/week   Barriers to discharge        Co-evaluation               AM-PAC PT "6 Clicks" Mobility  Outcome Measure Help needed turning from your back to your side while in a flat bed without using bedrails?: None Help needed moving from lying on your back to sitting on the side of a flat bed without using bedrails?: None Help needed moving to and from a bed to a chair (including a wheelchair)?: None Help needed standing up from a chair using your arms (e.g., wheelchair or bedside chair)?: None Help needed to walk in hospital room?: A Little Help needed climbing 3-5 steps with a railing? : A Little 6 Click Score: 22    End of Session   Activity Tolerance: Patient tolerated treatment well(Limited by brace not present) Patient left: in chair;with call bell/phone within reach Nurse Communication: Mobility status PT Visit Diagnosis: Other abnormalities of gait and mobility (R26.89)    Time: [id number] PT Time Calculation (min) (ACUTE ONLY): 26 min   Charges:   PT Evaluation $PT Eval Low Complexity: 1 Low PT Treatments $Self Care/Home Management: 8-22   Ina HomesJaclyn Lakeysha Slutsky, PT, DPT Acute Rehabilitation Services  Pager [phone] Office [phone]  Malachy ChamberJaclyn L Dhiren Azimi 09/08/2018, 10:52 AM

## 2018-09-08 NOTE — Progress Notes (Signed)
Vital signs are stable Postoperative hemoglobins a little low consistent with acute blood loss anemia however patient will not require transfusion Incision has a slight amount of serous drainage ventrally Otherwise incision looks good patient is ambulatory and feels better in her right hip and right lower extremity. Continue to monitor progress

## 2018-09-08 NOTE — Evaluation (Signed)
Occupational Therapy Evaluation Patient Details Name: Carolyn HissVictoria B Benson MRN: [id number] DOB: Oct 17, 1955 Today's Date: 09/08/2018    History of Present Illness Pt is a 63 y.o. female s/p L4-S1 ALIF on 09/07/18. PMH includes HTN, asthma, DM, depression, anxiety.   Clinical Impression   This 63 y/o female presents with the above. PTA pt reports independence with ADL and functional mobility. Pt performing room level functional mobility this session without AD and minguard assist. She currently requires minA for LB ADL and toileting tasks; setup/supervision for seated UB ADL. Pt reports plans to return home with significant other who can assist with ADL PRN. She will benefit from continued acute OT services prior to return home to maximize her safety and independence with ADL and mobility. Will follow.     Follow Up Recommendations  No OT follow up;Supervision/Assistance - 24 hour(24hr initially)    Equipment Recommendations  None recommended by OT           Precautions / Restrictions Precautions Precautions: Fall;Back Precaution Booklet Issued: Yes (comment) Precaution Comments: issued and reviewed with pt; pt able to recall 3/3 precautions Required Braces or Orthoses: Spinal Brace Spinal Brace: Lumbar corset;Applied in sitting position Restrictions Weight Bearing Restrictions: No      Mobility Bed Mobility Overal bed mobility: Independent             General bed mobility comments: received sitting up in recliner  Transfers Overall transfer level: Needs assistance Equipment used: None Transfers: Sit to/from Stand Sit to Stand: Supervision         General transfer comment: for safety, balance    Balance Overall balance assessment: Needs assistance   Sitting balance-Leahy Scale: Good     Standing balance support: No upper extremity supported Standing balance-Leahy Scale: Fair Standing balance comment: minguard for standing balance                            ADL either performed or assessed with clinical judgement   ADL Overall ADL's : Needs assistance/impaired Eating/Feeding: Independent;Sitting   Grooming: Min guard;Standing   Upper Body Bathing: Set up;Min guard;Sitting   Lower Body Bathing: Min guard;Sit to/from stand   Upper Body Dressing : Set up;Sitting Upper Body Dressing Details (indicate cue type and reason): pt donning lumbar corset with setup/min cues for technique, overall with good recall of education provided pre-op Lower Body Dressing: Sit to/from stand;Minimal assistance Lower Body Dressing Details (indicate cue type and reason): pt able to perform figure 4, discussed safety with LB ADL tasks Toilet Transfer: Min guard;Ambulation Toilet Transfer Details (indicate cue type and reason): simulated via transfer to/from recliner Toileting- Clothing Manipulation and Hygiene: Minimal assistance;Sit to/from stand Toileting - Clothing Manipulation Details (indicate cue type and reason): discussed technqiues for performing peri-care after BM as pt typically leans to the side to perform. discussed option of using AE (toilet aide) as well as assist from significant other and pt verbalizes feeling comfortable having sig other assist      Functional mobility during ADLs: Min guard General ADL Comments: reviewed back precautions, brace management, safety and compensatory techniques for performing ADL and mobility tasks                         Pertinent Vitals/Pain Pain Assessment: Faces Faces Pain Scale: Hurts little more Pain Location: R-side lower back Pain Descriptors / Indicators: Guarding;Sore Pain Intervention(s): Monitored during session;Limited activity within patient's tolerance;Repositioned  Hand Dominance     Extremity/Trunk Assessment Upper Extremity Assessment Upper Extremity Assessment: Overall WFL for tasks assessed   Lower Extremity Assessment Lower Extremity Assessment: Defer to PT  evaluation   Cervical / Trunk Assessment Cervical / Trunk Assessment: Other exceptions Cervical / Trunk Exceptions: s/p lumbar sx   Communication Communication Communication: No difficulties   Cognition Arousal/Alertness: Awake/alert Behavior During Therapy: WFL for tasks assessed/performed Overall Cognitive Status: Within Functional Limits for tasks assessed                                     General Comments       Exercises     Shoulder Instructions      Home Living Family/patient expects to be discharged to:: Private residence Living Arrangements: Spouse/significant other Available Help at Discharge: Family;Available 24 hours/day Type of Home: House Home Access: Stairs to enter [employer] of Steps: 4-5 Entrance Stairs-Rails: Right Home Layout: One level     Bathroom Shower/Tub: Teacher, early years/pre: Standard     Home Equipment: Environmental consultant - 2 wheels;Cane - single point;Shower seat          Prior Functioning/Environment Level of Independence: Independent        Comments: Drives. Limited by RLE pain prior to surgery, only able to tolerate walking/standing ~10 min bouts        OT Problem List: Decreased strength;Decreased range of motion;Decreased activity tolerance;Impaired balance (sitting and/or standing);Decreased knowledge of precautions;Decreased knowledge of use of DME or AE;Obesity;Pain      OT Treatment/Interventions: Self-care/ADL training;Therapeutic exercise;Energy conservation;DME and/or AE instruction;Therapeutic activities;Patient/family education;Balance training    OT Goals(Current goals can be found in the care plan section) Acute Rehab OT Goals Patient Stated Goal: return home when ready OT Goal Formulation: With patient Time For Goal Achievement: 09/22/18 Potential to Achieve Goals: Good  OT Frequency: Min 2X/week   Barriers to D/C:            Co-evaluation              AM-PAC OT "6  Clicks" Daily Activity     Outcome Measure Help from another person eating meals?: None Help from another person taking care of personal grooming?: None Help from another person toileting, which includes using toliet, bedpan, or urinal?: A Little Help from another person bathing (including washing, rinsing, drying)?: A Little Help from another person to put on and taking off regular upper body clothing?: None Help from another person to put on and taking off regular lower body clothing?: A Little 6 Click Score: 21   End of Session Equipment Utilized During Treatment: Back brace Nurse Communication: Mobility status  Activity Tolerance: Patient tolerated treatment well Patient left: in chair;with call bell/phone within reach  OT Visit Diagnosis: Other abnormalities of gait and mobility (R26.89);Pain Pain - part of body: (back)                Time: 4008-6761 OT Time Calculation (min): 19 min Charges:  OT General Charges $OT Visit: 1 Visit OT Evaluation $OT Eval Low Complexity: [place], OT Supplemental Rehabilitation Services Pager [phone] Office [phone]   Raymondo Band 09/08/2018, 1:48 PM

## 2018-09-09 LAB — GLUCOSE, CAPILLARY
Glucose-Capillary: 138 mg/dL — ABNORMAL HIGH (ref 70–99)
Glucose-Capillary: 164 mg/dL — ABNORMAL HIGH (ref 70–99)
Glucose-Capillary: 219 mg/dL — ABNORMAL HIGH (ref 70–99)
Glucose-Capillary: 224 mg/dL — ABNORMAL HIGH (ref 70–99)
Glucose-Capillary: 86 mg/dL (ref 70–99)

## 2018-09-09 MED ORDER — CEFAZOLIN SODIUM-DEXTROSE 2-4 GM/100ML-% IV SOLN
2.0000 g | Freq: Three times a day (TID) | INTRAVENOUS | Status: DC
  Administered 2018-09-09 – 2018-09-10 (×3): 2 g via INTRAVENOUS
  Filled 2018-09-09 (×6): qty 100

## 2018-09-09 NOTE — Progress Notes (Signed)
Physical Therapy Treatment and Discharge Patient Details Name: Carolyn Benson MRN: [id number] DOB: 1955-03-17 Today's Date: 09/09/2018    History of Present Illness Pt is a 63 y.o. female s/p L4-S1 ALIF on 09/07/18. PMH includes HTN, asthma, DM, depression, anxiety.    PT Comments    Pt met the majority of her physical therapy goals during her inpatient stay. Pt reporting good pain control, no radicular pain into BLE's. Ambulating hallway distances without physical assistance or an assistive device. Negotiated 5 steps with right railing to simulate home environment. No drainage noted on dressing following mobility. Pt recalling 3/3 spinal precautions. Education re: generalized walking program and activity progression. Pt with no further questions/concerns. PT signing off.   Follow Up Recommendations  No PT follow up;Supervision - Intermittent     Equipment Recommendations  None recommended by PT    Recommendations for Other Services       Precautions / Restrictions Precautions Precautions: Fall;Back Precaution Booklet Issued: Yes (comment) Precaution Comments: pt recalling 3/3 precautions Required Braces or Orthoses: Spinal Brace Spinal Brace: Lumbar corset;Applied in sitting position Restrictions Weight Bearing Restrictions: No    Mobility  Bed Mobility               General bed mobility comments: received sitting up on EOB  Transfers Overall transfer level: Modified independent Equipment used: None                Ambulation/Gait Ambulation/Gait assistance: Modified independent (Device/Increase time) Gait Distance (Feet): 400 Feet Assistive device: None Gait Pattern/deviations: Step-through pattern;Decreased stride length Gait velocity: Decreased   General Gait Details: Cues for right foot neutral alignment (pt with tendency for foot external rotation). Otherwise, slow, steady pace without an assistive device   Stairs Stairs: Yes Stairs  assistance: Min guard Stair Management: One rail Right Number of Stairs: 5 General stair comments: Cues for step by step pattern   Wheelchair Mobility    Modified Rankin (Stroke Patients Only)       Balance Overall balance assessment: Needs assistance   Sitting balance-Leahy Scale: Good     Standing balance support: No upper extremity supported Standing balance-Leahy Scale: Good                              Cognition Arousal/Alertness: Awake/alert Behavior During Therapy: WFL for tasks assessed/performed Overall Cognitive Status: Within Functional Limits for tasks assessed                                        Exercises      General Comments        Pertinent Vitals/Pain Pain Assessment: Faces Faces Pain Scale: Hurts a little bit Pain Location: surgical site Pain Descriptors / Indicators: Guarding;Sore Pain Intervention(s): Monitored during session    Home Living                      Prior Function            PT Goals (current goals can now be found in the care plan section) Acute Rehab PT Goals Patient Stated Goal: return home when ready Potential to Achieve Goals: Good Progress towards PT goals: Progressing toward goals    Frequency    Min 5X/week      PT Plan Current plan remains appropriate    Co-evaluation  AM-PAC PT "6 Clicks" Mobility   Outcome Measure  Help needed turning from your back to your side while in a flat bed without using bedrails?: None Help needed moving from lying on your back to sitting on the side of a flat bed without using bedrails?: None Help needed moving to and from a bed to a chair (including a wheelchair)?: None Help needed standing up from a chair using your arms (e.g., wheelchair or bedside chair)?: None Help needed to walk in hospital room?: None Help needed climbing 3-5 steps with a railing? : A Little 6 Click Score: 23    End of Session Equipment  Utilized During Treatment: Back brace Activity Tolerance: Patient tolerated treatment well Patient left: in bed;with call bell/phone within reach   PT Visit Diagnosis: Other abnormalities of gait and mobility (R26.89)     Time: 7062-3762 PT Time Calculation (min) (ACUTE ONLY): 20 min  Charges:  $Gait Training: 8-22 mins                     Ellamae Sia, PT, DPT [place] Pager [phone] Office [phone]    Willy Eddy 09/09/2018, 11:27 AM

## 2018-09-09 NOTE — Progress Notes (Signed)
Patient is awake and alert feeling well Abdominal incision has some modest serosanguineous drainage Dressing is been placed over this We will paint incision with Betadine and restart antibiotic coverage Ancef 2 g IV every 8 We will keep in hospital today until drainage subsides

## 2018-09-10 LAB — GLUCOSE, CAPILLARY
Glucose-Capillary: 118 mg/dL — ABNORMAL HIGH (ref 70–99)
Glucose-Capillary: 130 mg/dL — ABNORMAL HIGH (ref 70–99)
Glucose-Capillary: 133 mg/dL — ABNORMAL HIGH (ref 70–99)
Glucose-Capillary: 201 mg/dL — ABNORMAL HIGH (ref 70–99)
Glucose-Capillary: 227 mg/dL — ABNORMAL HIGH (ref 70–99)

## 2018-09-10 MED ORDER — METHOCARBAMOL 500 MG PO TABS: 500.0000 mg | ORAL_TABLET | Freq: Four times a day (QID) | ORAL | 3 refills | Status: AC | PRN

## 2018-09-10 MED ORDER — HYDROCODONE-ACETAMINOPHEN 10-325 MG PO TABS: 1.0000 | ORAL_TABLET | Freq: Four times a day (QID) | ORAL | 0 refills | Status: DC | PRN

## 2018-09-10 MED ORDER — CEPHALEXIN 500 MG PO CAPS: 500.0000 mg | ORAL_CAPSULE | Freq: Four times a day (QID) | ORAL | 0 refills | Status: AC

## 2018-09-10 NOTE — Progress Notes (Signed)
Occupational Therapy Treatment Patient Details Name: Carolyn Benson MRN: [id number] DOB: September 17, 1955 Today's Date: 09/10/2018    History of present illness Pt is a 63 y.o. female s/p L4-S1 ALIF on 09/07/18. PMH includes HTN, asthma, DM, depression, anxiety.   OT comments  Pt progressing towards OT Goals; able to perform functional mobility without AD at supervision level today. Pt completing toileting, UB and LB bathing during session with overall supervision. Further reviewed back precautions, safety and compensatory strategies for performing ADL and functional transfers after return home. Will continue per POC at this time.   Follow Up Recommendations  No OT follow up;Supervision/Assistance - 24 hour    Equipment Recommendations  None recommended by OT          Precautions / Restrictions Precautions Precautions: Fall;Back Precaution Booklet Issued: Yes (comment) Precaution Comments: pt recalling 3/3 precautions Required Braces or Orthoses: Spinal Brace Spinal Brace: Lumbar corset;Applied in sitting position Restrictions Weight Bearing Restrictions: No       Mobility Bed Mobility Overal bed mobility: Modified Independent                Transfers Overall transfer level: Modified independent Equipment used: None                  Balance Overall balance assessment: Needs assistance   Sitting balance-Leahy Scale: Good     Standing balance support: No upper extremity supported Standing balance-Leahy Scale: Good                             ADL either performed or assessed with clinical judgement   ADL Overall ADL's : Needs assistance/impaired     Grooming: Wash/dry face;Min guard;Standing   Upper Body Bathing: Set up;Sitting   Lower Body Bathing: Sit to/from stand;Supervison/ safety   Upper Body Dressing : Set up;Sitting Upper Body Dressing Details (indicate cue type and reason): donning new gown     Toilet Transfer:  Ambulation;Regular Toilet;Supervision/safety   Toileting- Clothing Manipulation and Hygiene: Supervision/safety;Sitting/lateral lean Toileting - Clothing Manipulation Details (indicate cue type and reason): pt performing pericare via lateral leans     Functional mobility during ADLs: Supervision/safety;Modified independent                         Cognition Arousal/Alertness: Awake/alert Behavior During Therapy: WFL for tasks assessed/performed Overall Cognitive Status: Within Functional Limits for tasks assessed                                          Exercises     Shoulder Instructions       General Comments      Pertinent Vitals/ Pain       Pain Assessment: Faces Faces Pain Scale: Hurts a little bit Pain Location: surgical site Pain Descriptors / Indicators: Guarding;Sore Pain Intervention(s): Monitored during session;Limited activity within patient's tolerance;Repositioned  Home Living Family/patient expects to be discharged to:: Private residence Living Arrangements: Spouse/significant other                                      Prior Functioning/Environment              Frequency  Min 2X/week        Progress Toward  Goals  OT Goals(current goals can now be found in the care plan section)  Progress towards OT goals: Progressing toward goals  Acute Rehab OT Goals Patient Stated Goal: return home when ready OT Goal Formulation: With patient Time For Goal Achievement: 09/22/18 Potential to Achieve Goals: Good  Plan Discharge plan remains appropriate    Co-evaluation                 AM-PAC OT "6 Clicks" Daily Activity     Outcome Measure   Help from another person eating meals?: None Help from another person taking care of personal grooming?: None Help from another person toileting, which includes using toliet, bedpan, or urinal?: A Little Help from another person bathing (including washing, rinsing,  drying)?: A Little Help from another person to put on and taking off regular upper body clothing?: None Help from another person to put on and taking off regular lower body clothing?: A Little 6 Click Score: 21    End of Session Equipment Utilized During Treatment: Back brace  OT Visit Diagnosis: Other abnormalities of gait and mobility (R26.89);Pain Pain - part of body: (back)   Activity Tolerance Patient tolerated treatment well   Patient Left in chair;with call bell/phone within reach   Nurse Communication Mobility status        Time: 1000-1021 OT Time Calculation (min): 21 min  Charges: OT General Charges $OT Visit: 1 Visit OT Treatments $Self Care/Home Management : 8-22 mins  Carolyn Benson, OT [facility] Pager [phone] Office [phone]    Carolyn PennerBreanna L Poppy Benson 09/10/2018, 3:59 PM

## 2018-09-10 NOTE — Discharge Summary (Signed)
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  Diabetes mellitus. 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  Diabetes mellitus.  Acute blood loss anemia. Hospital course: The patient was admitted to undergo surgical decompression at L4-5 and L5-S1 via an anterior lumbar interbody arthrodesis.  She tolerated surgery well blood loss was estimated at some 750 cc.  Postoperatively she had a greater than 3 g drop in hemoglobin consistent with acute blood loss anemia.  She did not require transfusion.  The abdomen appeared to be doing well and the patient was mobilizing well however she had some serosanguineous drainage from the abdominal incision.  This was treated topically with some dressing changes but persisted for 3 days.  On the second day she was started on IV antibiotics and topical dressing changes including painting with Betadine.  The incision became dry.  She is discharged home on oral Keflex 500 mg 4 times a day for 10 days, in addition to pain medication in the form of hydrocodone and methocarbamol for muscle relaxer.  She will be seen in 2 weeks time for further follow-up.  Her diabetes mellitus was managed by the patient herself who was adjusting her insulin pump to maintain a steady state glucose in the 100-1 50 range.

## 2018-09-10 NOTE — Progress Notes (Signed)
Patient discharged to home via spouse/significant other. All belongings with patient, abd. dressing changed and patient given additional education on care of abdominal wound care. Patient to call and make follow up appointments and AVS reviewed with no additional questions. Simmie Davies RN

## 2018-09-11 MED FILL — Heparin Sodium (Porcine) Inj 1000 Unit/ML: INTRAMUSCULAR | Qty: 30 | Status: AC

## 2018-09-11 MED FILL — Sodium Chloride IV Soln 0.9%: INTRAVENOUS | Qty: 1000 | Status: AC

## 2018-09-11 MED FILL — Sodium Chloride Irrigation Soln 0.9%: Qty: 3000 | Status: AC

## 2018-09-23 ENCOUNTER — Other Ambulatory Visit: Payer: Self-pay

## 2018-09-23 ENCOUNTER — Ambulatory Visit (HOSPITAL_COMMUNITY)
Admission: RE | Admit: 2018-09-23 | Discharge: 2018-09-23 | Disposition: A | Discharge status: Home / Self Care | Payer: Medicare HMO | Source: Ambulatory Visit | Attending: Family | Admitting: Family

## 2018-09-23 ENCOUNTER — Other Ambulatory Visit (HOSPITAL_COMMUNITY): Payer: Self-pay | Admitting: Neurological Surgery

## 2018-09-23 DIAGNOSIS — R52 Pain, unspecified: Secondary | ICD-10-CM | POA: Insufficient documentation

## 2018-09-30 ENCOUNTER — Ambulatory Visit (INDEPENDENT_AMBULATORY_CARE_PROVIDER_SITE_OTHER): Payer: Medicare HMO

## 2018-09-30 DIAGNOSIS — J4550 Severe persistent asthma, uncomplicated: Secondary | ICD-10-CM

## 2018-09-30 MED ORDER — BENRALIZUMAB 30 MG/ML ~~LOC~~ SOSY
30.0000 mg | PREFILLED_SYRINGE | SUBCUTANEOUS | Status: AC
  Administered 2018-09-30 – 2024-02-18 (×32): 30 mg via SUBCUTANEOUS

## 2018-11-25 ENCOUNTER — Other Ambulatory Visit: Payer: Self-pay

## 2018-11-25 ENCOUNTER — Telehealth: Payer: Self-pay

## 2018-11-25 ENCOUNTER — Ambulatory Visit (INDEPENDENT_AMBULATORY_CARE_PROVIDER_SITE_OTHER): Payer: Medicare HMO

## 2018-11-25 DIAGNOSIS — J4550 Severe persistent asthma, uncomplicated: Secondary | ICD-10-CM | POA: Diagnosis not present

## 2018-11-25 NOTE — Telephone Encounter (Signed)
Pt came in for fasenra injecton and wanted to know about restarting allergy injections. She had major back surgery in July and has not had a allergy injection since July 1ST. She is on green as maintence. She is 0.5 q 2-3 weeks. 1 injection. Where would you like to restart her at?   Pt also wants to be called and informed of where she is starting back at

## 2018-11-27 NOTE — Telephone Encounter (Signed)
Can you please restart this patient at 0.025 green? Thank you

## 2018-11-27 NOTE — Telephone Encounter (Signed)
Yes mam will note it in chart

## 2018-12-11 ENCOUNTER — Ambulatory Visit (INDEPENDENT_AMBULATORY_CARE_PROVIDER_SITE_OTHER): Payer: Medicare HMO

## 2018-12-11 DIAGNOSIS — J309 Allergic rhinitis, unspecified: Secondary | ICD-10-CM

## 2018-12-17 ENCOUNTER — Ambulatory Visit (INDEPENDENT_AMBULATORY_CARE_PROVIDER_SITE_OTHER): Payer: Medicare HMO

## 2018-12-17 DIAGNOSIS — J309 Allergic rhinitis, unspecified: Secondary | ICD-10-CM

## 2018-12-22 ENCOUNTER — Ambulatory Visit (INDEPENDENT_AMBULATORY_CARE_PROVIDER_SITE_OTHER): Payer: Medicare HMO

## 2018-12-22 DIAGNOSIS — J309 Allergic rhinitis, unspecified: Secondary | ICD-10-CM | POA: Diagnosis not present

## 2018-12-30 ENCOUNTER — Ambulatory Visit (INDEPENDENT_AMBULATORY_CARE_PROVIDER_SITE_OTHER): Payer: Medicare HMO

## 2018-12-30 DIAGNOSIS — J309 Allergic rhinitis, unspecified: Secondary | ICD-10-CM

## 2019-01-05 ENCOUNTER — Ambulatory Visit (INDEPENDENT_AMBULATORY_CARE_PROVIDER_SITE_OTHER): Payer: Medicare HMO

## 2019-01-05 DIAGNOSIS — J309 Allergic rhinitis, unspecified: Secondary | ICD-10-CM

## 2019-01-20 ENCOUNTER — Ambulatory Visit (INDEPENDENT_AMBULATORY_CARE_PROVIDER_SITE_OTHER): Payer: Medicare HMO

## 2019-01-20 ENCOUNTER — Other Ambulatory Visit: Payer: Self-pay

## 2019-01-20 DIAGNOSIS — J4550 Severe persistent asthma, uncomplicated: Secondary | ICD-10-CM | POA: Diagnosis not present

## 2019-01-22 ENCOUNTER — Ambulatory Visit (INDEPENDENT_AMBULATORY_CARE_PROVIDER_SITE_OTHER): Payer: Medicare HMO

## 2019-01-22 DIAGNOSIS — J309 Allergic rhinitis, unspecified: Secondary | ICD-10-CM | POA: Diagnosis not present

## 2019-01-25 ENCOUNTER — Other Ambulatory Visit: Payer: Self-pay

## 2019-01-25 ENCOUNTER — Ambulatory Visit: Payer: Self-pay

## 2019-01-25 ENCOUNTER — Ambulatory Visit (INDEPENDENT_AMBULATORY_CARE_PROVIDER_SITE_OTHER): Payer: Medicare HMO | Admitting: Family Medicine

## 2019-01-25 ENCOUNTER — Encounter: Payer: Self-pay | Admitting: Family Medicine

## 2019-01-25 VITALS — BP 110/70 | HR 79 | Temp 98.5°F | Resp 16

## 2019-01-25 DIAGNOSIS — K219 Gastro-esophageal reflux disease without esophagitis: Secondary | ICD-10-CM

## 2019-01-25 DIAGNOSIS — Z79899 Other long term (current) drug therapy: Secondary | ICD-10-CM

## 2019-01-25 DIAGNOSIS — J309 Allergic rhinitis, unspecified: Secondary | ICD-10-CM | POA: Diagnosis not present

## 2019-01-25 DIAGNOSIS — J4540 Moderate persistent asthma, uncomplicated: Secondary | ICD-10-CM | POA: Diagnosis not present

## 2019-01-25 DIAGNOSIS — E1169 Type 2 diabetes mellitus with other specified complication: Secondary | ICD-10-CM | POA: Diagnosis not present

## 2019-01-25 MED ORDER — BUDESONIDE-FORMOTEROL FUMARATE 160-4.5 MCG/ACT IN AERO: 2.0000 | INHALATION_SPRAY | Freq: Every day | RESPIRATORY_TRACT | 5 refills | Status: DC

## 2019-01-25 MED ORDER — EPINEPHRINE 0.3 MG/0.3ML IJ SOAJ: 0.3000 mg | Freq: Once | INTRAMUSCULAR | 0 refills | Status: AC

## 2019-01-25 MED ORDER — ALBUTEROL SULFATE HFA 108 (90 BASE) MCG/ACT IN AERS: 2.0000 | INHALATION_SPRAY | RESPIRATORY_TRACT | 5 refills | Status: DC | PRN

## 2019-01-25 NOTE — Patient Instructions (Addendum)
Asthma  - Restart Symbicort 160- 2 puffs once a day with a spacer to prevent cough and wheeze. Rinse with water and spit into the sink after each use - Continue montelukast 10 mg once a day to prevent cough or wheeze - Continue Ventolin inhaler 2 puffs every 4 hours as needed or albuterol via nebulizer 1 unit dose once every 4 hours as needed for cough or wheeze - Continue Fasenra 30 mg injection once every 8 weeks  Allergic rhinitis - Continue with your allergy injections on the current schedule  - Continue Flonase nasal spray as needed for a stuffy nose - Continue Xyzal once a day as needed for a runny nose.  - Continue nasal saline rinses as needed for nasal symptoms - Continue allergen avoidance measures.   Reflux - Continue Dexilant 60 mg as ordered to decrease reflux. This may help with wheezing - Continue lifestyle modifications to decrease reflux as listed below  Continue the other medications as listed in your chart.  Call me if this plan is not working well for you  Follow up in 6 months or sooner if needed    Lifestyle Changes for Controlling GERD When you have GERD, stomach acid feels as if it's backing up toward your mouth. Whether or not you take medication to control your GERD, your symptoms can often be improved with lifestyle changes.   Raise Your Head  Reflux is more likely to strike when you're lying down flat, because stomach fluid can  flow backward more easily. Raising the head of your bed 4-6 inches can help. To do this:  Slide blocks or books under the legs at the head of your bed. Or, place a wedge under  the mattress. Many foam stores can make a suitable wedge for you. The wedge  should run from your waist to the top of your head.  Don't just prop your head on several pillows. This increases pressure on your  stomach. It can make GERD worse.  Watch Your Eating Habits Certain foods may increase the acid in your stomach or relax the lower  esophageal sphincter, making GERD more likely. It's best to avoid the following:  Coffee, tea, and carbonated drinks (with and without caffeine)  Fatty, fried, or spicy food  Mint, chocolate, onions, and tomatoes  Any other foods that seem to irritate your stomach or cause you pain  Relieve the Pressure  Eat smaller meals, even if you have to eat more often.  Don't lie down right after you eat. Wait a few hours for your stomach to empty.  Avoid tight belts and tight-fitting clothes.  Lose excess weight.  Tobacco and Alcohol  Avoid smoking tobacco and drinking alcohol. They can make GERD symptoms worse.

## 2019-01-25 NOTE — Progress Notes (Signed)
[place] [id number] Dept: [phone]  FOLLOW UP NOTE  Patient ID: Carolyn Benson, female    DOB: 11-02-1955  Age: 63 y.o. MRN: [id number] Date of Office Visit: 01/25/2019  Assessment  Chief Complaint: Asthma  HPI Carolyn Benson is a 63 year old female who presents to the clinic for a follow up visit. At today's visit, she reports her asthma has been well controlled with no shortness of breath or cough and occasional intermittent wheeze occurring mostly at night. She continues montelukast 10 mg once a day, Symbicort a few times a week, and rarely uses Ventolin. She continues to receive Fasenra injections once every 8 weeks and reports a significant reduction in her symptoms of asthma while continuing [place]. Allergic rhinitis is reported as well controlled with Xyzal 5 mg once a day and Flonase and nasal saline rinses as needed. She reports infrequent intermittent heartburn and continues Dexilant once a day. Her current medications re listed in the chart.    Drug Allergies:  Allergies  Allergen Reactions  . Ibuprofen Swelling  . Ciprofloxacin Rash  . Clarithromycin Nausea And Vomiting  . Codeine Rash    Swelling  . Doxycycline Nausea And Vomiting  . Metoclopramide Rash    PT DOES NOT KNOW  . Omnipaque [Iohexol] Other (See Comments)    Kidneys bleed Kidneys bleed  . Sulfasalazine Rash  . Phenylpropanol Nausea And Vomiting  . Butorphanol Nausea Only  . Butorphanol Tartrate Nausea And Vomiting and Nausea Only  . Darvocet [Propoxyphene N-Acetaminophen] Nausea Only  . Other Rash    Fluoroquinolones  . Ranitidine Hcl Rash  . Stadol [Butorphanol Tartrate] Nausea Only  . Sulfa Antibiotics Rash    Physical Exam: BP 110/70   Pulse 79   Temp 98.5 F (36.9 C) (Oral)   Resp 16   SpO2 97%    Physical Exam Vitals reviewed.  Constitutional:      Appearance: Normal appearance.  HENT:     Head: Normocephalic and atraumatic.     Right Ear: Tympanic  membrane normal.     Left Ear: Tympanic membrane normal.     Nose:     Comments: Bilateral nares slightly erythematous with no nasal drainage noted. Pharynx normal. Ears normal. Eyes normal.    Mouth/Throat:     Pharynx: Oropharynx is clear.  Eyes:     Conjunctiva/sclera: Conjunctivae normal.  Cardiovascular:     Rate and Rhythm: Normal rate and regular rhythm.     Heart sounds: Normal heart sounds. No murmur.  Pulmonary:     Effort: Pulmonary effort is normal.     Breath sounds: Normal breath sounds.     Comments: Lungs clear to auscultation Musculoskeletal:        General: Normal range of motion.     Cervical back: Normal range of motion and neck supple.  Skin:    General: Skin is warm and dry.  Neurological:     Mental Status: She is alert and oriented to person, place, and time.  Psychiatric:        Mood and Affect: Mood normal.        Behavior: Behavior normal.        Thought Content: Thought content normal.        Judgment: Judgment normal.     Diagnostics: FVC 1.57, FEV1 1.29. Predicted FVC 3.10, predicted FEV1 2.38. Spirometry indicates moderate restriction. This is consistent with previous spirometry readings.   Assessment and Plan: 1. Moderate persistent asthma  without complication   2. Allergic rhinitis, unspecified seasonality, unspecified trigger   3. Gastroesophageal reflux disease without esophagitis   4. Type 2 diabetes mellitus with other specified complication, unspecified whether long term insulin use (HCC)   5. Current use of beta blocker     Meds ordered this encounter  Medications  . EPINEPHrine (EPIPEN 2-PAK) 0.3 mg/0.3 mL IJ SOAJ injection    Sig: Inject 0.3 mLs (0.3 mg total) into the muscle once for 1 dose.    Dispense:  0.3 mL    Refill:  0    Dispense mylan generic brand only  . albuterol (VENTOLIN HFA) 108 (90 Base) MCG/ACT inhaler    Sig: Inhale 2 puffs into the lungs every 4 (four) hours as needed for wheezing or shortness of breath.     Dispense:  18 g    Refill:  5    90 day supply  . budesonide-formoterol (SYMBICORT) 160-4.5 MCG/ACT inhaler    Sig: Inhale 2 puffs into the lungs daily.    Dispense:  1 Inhaler    Refill:  5    Patient Instructions  Asthma  - Restart Symbicort 160- 2 puffs once a day with a spacer to prevent cough and wheeze. Rinse with water and spit into the sink after each use - Continue montelukast 10 mg once a day to prevent cough or wheeze - Continue Ventolin inhaler 2 puffs every 4 hours as needed or albuterol via nebulizer 1 unit dose once every 4 hours as needed for cough or wheeze - Continue Fasenra 30 mg injection once every 8 weeks  Allergic rhinitis - Continue with your allergy injections on the current schedule  - Continue Flonase nasal spray as needed for a stuffy nose - Continue Xyzal once a day as needed for a runny nose.  - Continue nasal saline rinses as needed for nasal symptoms - Continue allergen avoidance measures.   Reflux - Continue Dexilant 60 mg as ordered to decrease reflux. This may help with wheezing - Continue lifestyle modifications to decrease reflux as listed below  Continue the other medications as listed in your chart.  Call me if this plan is not working well for you  Follow up in 6 months or sooner if needed   Return in about 6 months (around 07/26/2019), or if symptoms worsen or fail to improve.   Thank you for the opportunity to care for this patient.  Please do not hesitate to contact me with questions.  Thermon Leyland, FNP [facility]  I have provided oversight concerning Thermon Leyland' evaluation and treatment of this patient's health issues addressed during today's encounter. I agree with the assessment and therapeutic plan as outlined in the note.   Thank you for the opportunity to care for this patient.  Please do not hesitate to contact me with questions.  Tonette Bihari, M.D.  [facility] [address] [place], [place] [address] [phone]

## 2019-01-26 ENCOUNTER — Telehealth: Payer: Self-pay

## 2019-01-26 NOTE — Telephone Encounter (Signed)
This is great news. Thank you.

## 2019-01-26 NOTE — Telephone Encounter (Signed)
Pt has been approved for the [place] and [place] program until 02/11/2020

## 2019-02-03 ENCOUNTER — Ambulatory Visit (INDEPENDENT_AMBULATORY_CARE_PROVIDER_SITE_OTHER): Payer: Medicare HMO

## 2019-02-03 DIAGNOSIS — J309 Allergic rhinitis, unspecified: Secondary | ICD-10-CM

## 2019-02-15 ENCOUNTER — Ambulatory Visit (INDEPENDENT_AMBULATORY_CARE_PROVIDER_SITE_OTHER): Payer: Medicare HMO

## 2019-02-15 DIAGNOSIS — J309 Allergic rhinitis, unspecified: Secondary | ICD-10-CM | POA: Diagnosis not present

## 2019-02-17 ENCOUNTER — Other Ambulatory Visit: Payer: Self-pay | Admitting: Neurological Surgery

## 2019-02-17 ENCOUNTER — Other Ambulatory Visit (HOSPITAL_COMMUNITY): Payer: Self-pay | Admitting: Neurological Surgery

## 2019-02-17 DIAGNOSIS — R2689 Other abnormalities of gait and mobility: Secondary | ICD-10-CM

## 2019-03-03 ENCOUNTER — Other Ambulatory Visit: Payer: Self-pay

## 2019-03-03 ENCOUNTER — Ambulatory Visit (INDEPENDENT_AMBULATORY_CARE_PROVIDER_SITE_OTHER): Payer: Medicare HMO

## 2019-03-03 DIAGNOSIS — J4540 Moderate persistent asthma, uncomplicated: Secondary | ICD-10-CM

## 2019-03-03 DIAGNOSIS — J4550 Severe persistent asthma, uncomplicated: Secondary | ICD-10-CM

## 2019-03-05 ENCOUNTER — Ambulatory Visit (INDEPENDENT_AMBULATORY_CARE_PROVIDER_SITE_OTHER): Payer: Medicare HMO

## 2019-03-05 DIAGNOSIS — J309 Allergic rhinitis, unspecified: Secondary | ICD-10-CM | POA: Diagnosis not present

## 2019-03-10 ENCOUNTER — Ambulatory Visit (HOSPITAL_COMMUNITY)
Admission: RE | Admit: 2019-03-10 | Discharge: 2019-03-10 | Disposition: A | Discharge status: Home / Self Care | Payer: Medicare HMO | Source: Ambulatory Visit | Attending: Neurological Surgery | Admitting: Neurological Surgery

## 2019-03-10 ENCOUNTER — Other Ambulatory Visit: Payer: Self-pay

## 2019-03-10 DIAGNOSIS — M48061 Spinal stenosis, lumbar region without neurogenic claudication: Secondary | ICD-10-CM | POA: Insufficient documentation

## 2019-03-10 DIAGNOSIS — M4807 Spinal stenosis, lumbosacral region: Secondary | ICD-10-CM | POA: Insufficient documentation

## 2019-03-10 DIAGNOSIS — M4802 Spinal stenosis, cervical region: Secondary | ICD-10-CM | POA: Diagnosis not present

## 2019-03-10 DIAGNOSIS — M5031 Other cervical disc degeneration,  high cervical region: Secondary | ICD-10-CM | POA: Diagnosis not present

## 2019-03-10 DIAGNOSIS — M5021 Other cervical disc displacement,  high cervical region: Secondary | ICD-10-CM | POA: Insufficient documentation

## 2019-03-10 DIAGNOSIS — M5136 Other intervertebral disc degeneration, lumbar region: Secondary | ICD-10-CM | POA: Diagnosis present

## 2019-03-10 DIAGNOSIS — Z981 Arthrodesis status: Secondary | ICD-10-CM | POA: Insufficient documentation

## 2019-03-10 DIAGNOSIS — R2689 Other abnormalities of gait and mobility: Secondary | ICD-10-CM

## 2019-03-10 LAB — GLUCOSE, CAPILLARY: Glucose-Capillary: 148 mg/dL — ABNORMAL HIGH (ref 70–99)

## 2019-03-10 MED ORDER — HYDROCODONE-ACETAMINOPHEN 5-325 MG PO TABS
1.0000 | ORAL_TABLET | ORAL | Status: DC | PRN
  Administered 2019-03-10: 09:00:00 1 via ORAL

## 2019-03-10 MED ORDER — HYDROCODONE-ACETAMINOPHEN 5-325 MG PO TABS
ORAL_TABLET | ORAL | Status: AC
  Filled 2019-03-10: qty 1

## 2019-03-10 MED ORDER — LIDOCAINE HCL (PF) 1 % IJ SOLN
5.0000 mL | Freq: Once | INTRAMUSCULAR | Status: AC
  Administered 2019-03-10: 09:00:00 5 mL via INTRADERMAL

## 2019-03-10 MED ORDER — IOHEXOL 180 MG/ML  SOLN
10.0000 mL | Freq: Once | INTRAMUSCULAR | Status: AC | PRN
  Administered 2019-03-10: 09:00:00 8 mL via INTRATHECAL

## 2019-03-10 MED ORDER — DIAZEPAM 5 MG PO TABS: 10.0000 mg | ORAL_TABLET | Freq: Once | ORAL | Status: AC

## 2019-03-10 MED ORDER — DIAZEPAM 5 MG PO TABS
ORAL_TABLET | ORAL | Status: AC
  Administered 2019-03-10: 07:00:00 10 mg via ORAL
  Filled 2019-03-10: qty 2

## 2019-03-10 MED ORDER — ONDANSETRON HCL 4 MG/2ML IJ SOLN: 4.0000 mg | Freq: Four times a day (QID) | INTRAMUSCULAR | Status: DC | PRN

## 2019-03-10 NOTE — Progress Notes (Addendum)
Pt states a long time ago when she had  the" kind of dye that burns" It made her kidneys bleed .  She states she has had the dye they use in [place] since then without any problems.  Radiology notified. ie Emily,RT

## 2019-03-10 NOTE — Discharge Instructions (Signed)
Myelogram  A myelogram is an imaging study of the spinal cord and the places where nerves attach to the spinal cord (nerve roots). A dye (contrast material) is injected into the spine before the X-ray. This provides a clearer image for your health care provider to see. You may need this study done if you have a spinal cord problem that cannot be diagnosed with other imaging studies, such as a CT scan or an MRI. You may also have this study to check your spine after surgery. Tell a health care provider about:  Any allergies you have, especially to iodine.  All medicines you are taking, including vitamins, herbs, eye drops, creams, and over-the-counter medicines.  Any problems you or family members have had with anesthetic medicines or contrast material.  Any blood disorders you have.  Any surgeries you have had.  Any medical conditions you have or have had, including asthma.  Whether you are pregnant or may be pregnant. What are the risks? Generally, this is a safe procedure. However, problems may occur, including:  Infection.  Bleeding.  Allergic reaction to medicines or dyes.  Damage to your spinal cord or nerves.  Loss or leaking of spinal fluid. This can lead to headaches.  Damage to kidneys.  Seizures. This is rare. What happens before the procedure?  Follow instructions from your health care provider about eating or drinking restrictions. You may be asked to drink more fluids.  Ask your health care provider about changing or stopping your regular medicines. This is especially important if you are taking diabetes medicines or blood thinners.  Plan to have someone take you home from the hospital or clinic.  If you will be going home right after the procedure, plan to have someone with you for 24 hours. What happens during the procedure?  You will lie face down on a table.  Your health care provider will locate the best injection site on your spine. This is most  often in the lower back.  The area of injection will be washed with soap.  You will be given a medicine to numb the area (local anesthetic).  Your health care provider will insert a long needle into the space around your spinal cord (subarachnoid space).  A sample of spinal fluid may be taken and sent to the lab for testing.  The contrast material will be injected into the subarachnoid space.  The exam table may be tilted to help the contrast material flow up or down your spine.  The X-ray will take images of your spinal cord for your health care provider to examine.  A bandage (dressing) may be placed over the injection site. The procedure may vary among health care providers and hospitals. What can I expect after this procedure?  Your blood pressure, heart rate, breathing rate, and blood oxygen level may be monitored until you leave the hospital or clinic.  You may have: ? Soreness on your injection site. ? A mild headache.  You will be asked to lie down with your head raised (elevated). This reduces the risk of a headache.  It is up to you to get the results of your procedure. Ask your health care provider, or the department that is doing the procedure, when your results will be ready. Follow these instructions at home:   Rest as told by your health care provider. Lie flat with your head slightly elevated to reduce the risk of a headache.  Do not bend, lift, or do hard work   for 24-48 hours, or as told by your health care provider.  Take over-the-counter and prescription medicines only as told by your health care provider.  Take care of your dressing as told by your health care provider.  Drink enough fluid to keep your urine pale yellow.  Bathe or shower as told by your health care provider. Contact a health care provider if:  You have a fever.  You have a headache that lasts longer than 24 hours.  You feel nauseous or vomit.  You have a stiff neck or numbness in  your legs.  You are unable to urinate or have a bowel movement.  You develop a rash, itching, or sneezing. Get help right away if:  You have new symptoms or your symptoms get worse.  You have a seizure.  You have trouble breathing. Summary  A myelogram is an imaging study of the spinal cord and the places where nerves attach to the spinal cord (nerve roots).  Before the procedure, follow instructions from your health care provider about changing or stopping your regular medicines, and eating and drinking restrictions.  After this procedure, you will be asked to lie down with your head raised (elevated). This reduces your risk of a headache.  Do not bend, lift, or do hard work for 24-48 hours, or as told by your health care provider.  Contact a health care provider if you have a stiff neck or numbness in your legs. Get help right away if symptoms get worse, or you have a seizure or trouble breathing. This information is not intended to replace advice given to you by your health care provider. Make sure you discuss any questions you have with your health care provider. Document Revised: 04/08/2018 Document Reviewed: 04/09/2018 Elsevier Patient Education  2020 [employer]  

## 2019-03-10 NOTE — Progress Notes (Signed)
Discharge instructions reviewed with pt and her family both voice understanding.

## 2019-03-10 NOTE — Procedures (Signed)
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  She had undergone a two-level anterior decompression arthrodesis and appears to be healing well however after 6 months she still complains bitterly of significant back pain and leg pain and notes that her strength in her lower extremities has been poor she notes that she has had a number of falls and has been concerned about her overall functional status it is apparent that she has some significant diffuse weakness and is concerned that she has a myelopathy that may be coming from areas above her lumbar spine I have advised a total myelogram to look at the entire spinal axis to define any areas of compression.  This is largely because she has significant titanium implant in the lower lumbar spine. [name] also notes that she has a severe case of claustrophobia which would make getting multiple MRIs of the cervical and thoracic and lumbar spines difficult.  Pre op Dx: Myelopathy, history of fusion L4-5 L5-S1 Post op Dx: Same Procedure: Total myelogram Surgeon: Danielle Dess Puncture level: L2-3 Fluid color: Clear colorless Injection: Iohexol 300, 7 mL Findings: Moderately severe stenosis L4-5 and L5-S1.  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  Further evaluation with CT scanning occiput to sacrum

## 2019-03-12 ENCOUNTER — Ambulatory Visit (INDEPENDENT_AMBULATORY_CARE_PROVIDER_SITE_OTHER): Payer: Medicare HMO

## 2019-03-12 DIAGNOSIS — J309 Allergic rhinitis, unspecified: Secondary | ICD-10-CM

## 2019-03-16 ENCOUNTER — Ambulatory Visit (INDEPENDENT_AMBULATORY_CARE_PROVIDER_SITE_OTHER): Payer: Medicare HMO

## 2019-03-16 DIAGNOSIS — J309 Allergic rhinitis, unspecified: Secondary | ICD-10-CM

## 2019-03-31 ENCOUNTER — Ambulatory Visit (INDEPENDENT_AMBULATORY_CARE_PROVIDER_SITE_OTHER): Payer: Medicare HMO

## 2019-03-31 DIAGNOSIS — J309 Allergic rhinitis, unspecified: Secondary | ICD-10-CM | POA: Diagnosis not present

## 2019-04-15 ENCOUNTER — Ambulatory Visit (INDEPENDENT_AMBULATORY_CARE_PROVIDER_SITE_OTHER): Payer: Medicare HMO

## 2019-04-15 DIAGNOSIS — J309 Allergic rhinitis, unspecified: Secondary | ICD-10-CM

## 2019-04-28 ENCOUNTER — Ambulatory Visit: Payer: Self-pay

## 2019-04-30 ENCOUNTER — Ambulatory Visit: Payer: Self-pay

## 2019-05-03 ENCOUNTER — Ambulatory Visit (INDEPENDENT_AMBULATORY_CARE_PROVIDER_SITE_OTHER): Payer: Medicare HMO

## 2019-05-03 ENCOUNTER — Other Ambulatory Visit: Payer: Self-pay

## 2019-05-03 DIAGNOSIS — J4550 Severe persistent asthma, uncomplicated: Secondary | ICD-10-CM

## 2019-05-04 NOTE — Progress Notes (Signed)
VIALS EXP 05-03-20 

## 2019-05-06 DIAGNOSIS — J3089 Other allergic rhinitis: Secondary | ICD-10-CM

## 2019-05-07 ENCOUNTER — Ambulatory Visit (INDEPENDENT_AMBULATORY_CARE_PROVIDER_SITE_OTHER): Payer: Medicare HMO

## 2019-05-07 DIAGNOSIS — J309 Allergic rhinitis, unspecified: Secondary | ICD-10-CM

## 2019-05-27 ENCOUNTER — Ambulatory Visit (INDEPENDENT_AMBULATORY_CARE_PROVIDER_SITE_OTHER): Payer: Medicare HMO

## 2019-05-27 DIAGNOSIS — J309 Allergic rhinitis, unspecified: Secondary | ICD-10-CM | POA: Diagnosis not present

## 2019-06-10 ENCOUNTER — Ambulatory Visit (INDEPENDENT_AMBULATORY_CARE_PROVIDER_SITE_OTHER): Payer: Medicare HMO

## 2019-06-10 DIAGNOSIS — J309 Allergic rhinitis, unspecified: Secondary | ICD-10-CM

## 2019-06-16 ENCOUNTER — Ambulatory Visit (INDEPENDENT_AMBULATORY_CARE_PROVIDER_SITE_OTHER): Payer: Medicare HMO

## 2019-06-16 DIAGNOSIS — J309 Allergic rhinitis, unspecified: Secondary | ICD-10-CM | POA: Diagnosis not present

## 2019-06-23 ENCOUNTER — Ambulatory Visit (INDEPENDENT_AMBULATORY_CARE_PROVIDER_SITE_OTHER): Payer: Medicare HMO | Admitting: *Deleted

## 2019-06-23 DIAGNOSIS — J309 Allergic rhinitis, unspecified: Secondary | ICD-10-CM

## 2019-06-28 ENCOUNTER — Other Ambulatory Visit: Payer: Self-pay

## 2019-06-28 ENCOUNTER — Ambulatory Visit (INDEPENDENT_AMBULATORY_CARE_PROVIDER_SITE_OTHER): Payer: Medicare HMO

## 2019-06-28 DIAGNOSIS — J4540 Moderate persistent asthma, uncomplicated: Secondary | ICD-10-CM | POA: Diagnosis not present

## 2019-07-01 ENCOUNTER — Ambulatory Visit (INDEPENDENT_AMBULATORY_CARE_PROVIDER_SITE_OTHER): Payer: Medicare HMO

## 2019-07-01 DIAGNOSIS — J309 Allergic rhinitis, unspecified: Secondary | ICD-10-CM | POA: Diagnosis not present

## 2019-07-07 ENCOUNTER — Ambulatory Visit (INDEPENDENT_AMBULATORY_CARE_PROVIDER_SITE_OTHER): Payer: Medicare HMO | Admitting: *Deleted

## 2019-07-07 DIAGNOSIS — J309 Allergic rhinitis, unspecified: Secondary | ICD-10-CM | POA: Diagnosis not present

## 2019-07-26 ENCOUNTER — Ambulatory Visit: Payer: Medicare HMO | Admitting: Family Medicine

## 2019-07-28 ENCOUNTER — Ambulatory Visit: Payer: Medicare HMO | Admitting: Family Medicine

## 2019-08-10 NOTE — Patient Instructions (Signed)
Asthma  Continue montelukast 10 mg once a day to prevent cough or wheeze Continue Proventil inhaler 2 puffs every 4 hours as needed or albuterol via nebulizer 1 unit dose once every 4 hours as needed for cough or wheeze For asthma flare, begin Symbicort 160-2 puffs twice a day with a spacer and call the clinic Continue Fasenra 30 mg once every 8 weeks and have an epinephrine auto-injector set at all times  Allergic rhinitis Continue with your allergy injections on the current schedule of once every 4 weeks  Continue Flonase nasal spray as needed for a stuffy nose Continue Xyzal once a day as needed for a runny nose.  Begin nasal saline rinses at least once a day Continue allergen avoidance measures as listed below  Reflux Continue Dexilant 60 mg as ordered to decrease reflux. This may help with wheezing Continue lifestyle modifications to decrease reflux  Continue the other medications as listed in your chart.  Call me if this plan is not working well for you  Follow up in 6 or sooner if needed   Control of Mold Allergen Mold and fungi can grow on a variety of surfaces provided certain temperature and moisture conditions exist.  Outdoor molds grow on plants, decaying vegetation and soil.  The major outdoor mold, Alternaria and Cladosporium, are found in very high numbers during hot and dry conditions.  Generally, a late Summer - Fall peak is seen for common outdoor fungal spores.  Rain will temporarily lower outdoor mold spore count, but counts rise rapidly when the rainy period ends.  The most important indoor molds are Aspergillus and Penicillium.  Dark, humid and poorly ventilated basements are ideal sites for mold growth.  The next most common sites of mold growth are the bathroom and the kitchen.  Outdoor [employer] 1. Use air conditioning and keep windows closed 2. Avoid exposure to decaying vegetation. 3. Avoid leaf raking. 4. Avoid grain handling. 5. Consider wearing a face  mask if working in moldy areas.  Indoor Mold Control 1. Maintain humidity below 50%. 2. Clean washable surfaces with 5% bleach solution. 3. Remove sources e.g. Contaminated carpets.

## 2019-08-10 NOTE — Progress Notes (Addendum)
[address] [place] [address] Dept: [phone]  FOLLOW UP NOTE  Patient ID: Carolyn Benson, female    DOB: [birth year]  Age: 64 y.o. MRN: [id number] Date of Office Visit: 08/11/2019  Assessment  Chief Complaint: Asthma (doing well.) and Allergic Rhinitis   HPI Carolyn Benson is a 64 year old female who presents to the clinic for follow-up visit.  She was last seen in this clinic on 01/25/2019 by Dr. Daun Peacock for evaluation of asthma, allergic rhinitis, and reflux.  At today's visit she reports her asthma has been well controlled with no shortness of breath or cough with activity or rest.  She reports an occasional wheeze " once every blue moon" during the night.  She is currently taking montelukast 5 mg once a day and receiving Fasenra 30 mg once every 8 weeks.  She reports she has not used her Symbicort or albuterol for 6 months.  Allergic rhinitis is reported as well controlled with occasional clear rhinorrhea and sneeze.  She continues Xyzal 5 mg once a day and uses Flonase as needed.  She continues allergen immunotherapy with no large local reactions.  She reports her symptoms of allergic rhinitis have significantly decreased while continuing on allergen immunotherapy.  Reflux is reported as well controlled with Dexilant 60 mg once a day.  Her current medications are listed in the chart.   Drug Allergies:  Allergies  Allergen Reactions  . Ibuprofen Swelling  . Ciprofloxacin Rash  . Clarithromycin Nausea And Vomiting  . Codeine Rash    Swelling  . Doxycycline Nausea And Vomiting  . Metoclopramide Rash    PT DOES NOT KNOW  . Omnipaque [Iohexol] Other (See Comments)    Kidneys bleed Kidneys bleed  . Sulfasalazine Rash  . Phenylpropanol Nausea And Vomiting  . Butorphanol Nausea Only  . Butorphanol Tartrate Nausea And Vomiting and Nausea Only  . Darvocet [Propoxyphene N-Acetaminophen] Nausea Only  . Other Rash    Fluoroquinolones  . Ranitidine Hcl Rash  .  Stadol [Butorphanol Tartrate] Nausea Only  . Sulfa Antibiotics Rash    Physical Exam: BP 114/72 (BP Location: Right Arm, Patient Position: Sitting, Cuff Size: Large)   Pulse 92   Temp 97.9 F (36.6 C) (Oral)   Resp 20   Ht 5\' 3"  (1.6 m)   Wt 225 lb 6.4 oz (102.2 kg)   SpO2 94%   BMI 39.93 kg/m    Physical Exam Vitals reviewed.  Constitutional:      Appearance: Normal appearance.  HENT:     Head: Normocephalic and atraumatic.     Right Ear: Tympanic membrane normal.     Left Ear: Tympanic membrane normal.     Nose:     Comments: Bilateral nares slightly erythematous with clear nasal drainage noted.  Pharynx normal.  Ears normal.  Eyes normal.    Mouth/Throat:     Pharynx: Oropharynx is clear.  Eyes:     Conjunctiva/sclera: Conjunctivae normal.  Cardiovascular:     Rate and Rhythm: Normal rate and regular rhythm.     Heart sounds: Normal heart sounds. No murmur heard.   Pulmonary:     Effort: Pulmonary effort is normal.     Breath sounds: Normal breath sounds.     Comments: Lungs clear to auscultation Musculoskeletal:        General: Normal range of motion.     Cervical back: Normal range of motion and neck supple.  Skin:    General: Skin is warm and dry.  Neurological:     Mental Status: She is alert and oriented to person, place, and time.  Psychiatric:        Mood and Affect: Mood normal.        Behavior: Behavior normal.        Thought Content: Thought content normal.        Judgment: Judgment normal.     Diagnostics: FVC 1.76, FEV1 1.42.  Predicted FVC 3.07, predicted FEV1 2.35.  Spirometry indicates moderate restriction.  This is consistent with previous spirometry reading.  Assessment and Plan: 1. Severe persistent asthma without complication   2. Non-seasonal allergic rhinitis, unspecified trigger   3. Gastroesophageal reflux disease without esophagitis     Meds ordered this encounter  Medications  . albuterol (VENTOLIN HFA) 108 (90 Base) MCG/ACT  inhaler    Sig: Inhale 2 puffs into the lungs every 4 (four) hours as needed for wheezing or shortness of breath.    Dispense:  18 g    Refill:  5    90 day supply    Patient Instructions  Asthma  Continue montelukast 10 mg once a day to prevent cough or wheeze Continue Proventil inhaler 2 puffs every 4 hours as needed or albuterol via nebulizer 1 unit dose once every 4 hours as needed for cough or wheeze For asthma flare, begin Symbicort 160-2 puffs twice a day with a spacer and call the clinic Continue Fasenra 30 mg once every 8 weeks and have an epinephrine auto-injector set at all times  Allergic rhinitis Continue with your allergy injections on the current schedule of once every 4 weeks  Continue Flonase nasal spray as needed for a stuffy nose Continue Xyzal once a day as needed for a runny nose.  Begin nasal saline rinses at least once a day Continue allergen avoidance measures as listed below  Reflux Continue Dexilant 60 mg as ordered to decrease reflux. This may help with wheezing Continue lifestyle modifications to decrease reflux  Continue the other medications as listed in your chart.  Call me if this plan is not working well for you  Follow up in 6 or sooner if needed    Return in about 6 months (around 02/10/2020), or if symptoms worsen or fail to improve.    Thank you for the opportunity to care for this patient.  Please do not hesitate to contact me with questions.  Thermon Leyland, FNP [facility]   ________________________________________________  I have provided oversight concerning [facility]'s evaluation and treatment of this patient's health issues addressed during today's encounter.  I agree with the assessment and therapeutic plan as outlined in the note.   Signed,   R Jorene Guest, MD

## 2019-08-11 ENCOUNTER — Telehealth: Payer: Self-pay | Admitting: Allergy

## 2019-08-11 ENCOUNTER — Other Ambulatory Visit: Payer: Self-pay

## 2019-08-11 ENCOUNTER — Ambulatory Visit (INDEPENDENT_AMBULATORY_CARE_PROVIDER_SITE_OTHER): Payer: Medicare HMO

## 2019-08-11 ENCOUNTER — Encounter: Payer: Self-pay | Admitting: Family Medicine

## 2019-08-11 ENCOUNTER — Ambulatory Visit: Payer: Medicare HMO | Admitting: Family Medicine

## 2019-08-11 VITALS — BP 114/72 | HR 92 | Temp 97.9°F | Resp 20 | Ht 63.0 in | Wt 225.4 lb

## 2019-08-11 DIAGNOSIS — J3089 Other allergic rhinitis: Secondary | ICD-10-CM

## 2019-08-11 DIAGNOSIS — J309 Allergic rhinitis, unspecified: Secondary | ICD-10-CM

## 2019-08-11 DIAGNOSIS — K219 Gastro-esophageal reflux disease without esophagitis: Secondary | ICD-10-CM

## 2019-08-11 DIAGNOSIS — J4550 Severe persistent asthma, uncomplicated: Secondary | ICD-10-CM | POA: Diagnosis not present

## 2019-08-11 DIAGNOSIS — J4540 Moderate persistent asthma, uncomplicated: Secondary | ICD-10-CM | POA: Insufficient documentation

## 2019-08-11 MED ORDER — EPINEPHRINE 0.3 MG/0.3ML IJ SOAJ: INTRAMUSCULAR | 1 refills | Status: DC

## 2019-08-11 MED ORDER — ALBUTEROL SULFATE HFA 108 (90 BASE) MCG/ACT IN AERS: 2.0000 | INHALATION_SPRAY | RESPIRATORY_TRACT | 5 refills | Status: DC | PRN

## 2019-08-11 NOTE — Telephone Encounter (Signed)
Made in error

## 2019-08-23 ENCOUNTER — Ambulatory Visit (INDEPENDENT_AMBULATORY_CARE_PROVIDER_SITE_OTHER): Payer: Medicare HMO

## 2019-08-23 ENCOUNTER — Other Ambulatory Visit: Payer: Self-pay

## 2019-08-23 DIAGNOSIS — J4540 Moderate persistent asthma, uncomplicated: Secondary | ICD-10-CM

## 2019-08-23 DIAGNOSIS — J4550 Severe persistent asthma, uncomplicated: Secondary | ICD-10-CM | POA: Diagnosis not present

## 2019-08-30 ENCOUNTER — Ambulatory Visit (INDEPENDENT_AMBULATORY_CARE_PROVIDER_SITE_OTHER): Payer: Medicare HMO

## 2019-08-30 DIAGNOSIS — J309 Allergic rhinitis, unspecified: Secondary | ICD-10-CM | POA: Diagnosis not present

## 2019-09-13 ENCOUNTER — Ambulatory Visit (INDEPENDENT_AMBULATORY_CARE_PROVIDER_SITE_OTHER): Payer: Medicare HMO

## 2019-09-13 DIAGNOSIS — J309 Allergic rhinitis, unspecified: Secondary | ICD-10-CM | POA: Diagnosis not present

## 2019-09-29 ENCOUNTER — Ambulatory Visit (INDEPENDENT_AMBULATORY_CARE_PROVIDER_SITE_OTHER): Payer: Medicare HMO | Admitting: *Deleted

## 2019-09-29 DIAGNOSIS — J309 Allergic rhinitis, unspecified: Secondary | ICD-10-CM

## 2019-10-19 ENCOUNTER — Other Ambulatory Visit: Payer: Self-pay

## 2019-10-19 ENCOUNTER — Ambulatory Visit (INDEPENDENT_AMBULATORY_CARE_PROVIDER_SITE_OTHER): Payer: Medicare HMO

## 2019-10-19 DIAGNOSIS — J4550 Severe persistent asthma, uncomplicated: Secondary | ICD-10-CM | POA: Diagnosis not present

## 2019-10-19 DIAGNOSIS — J4540 Moderate persistent asthma, uncomplicated: Secondary | ICD-10-CM

## 2019-10-25 ENCOUNTER — Other Ambulatory Visit: Payer: Self-pay | Admitting: Family Medicine

## 2019-11-09 ENCOUNTER — Ambulatory Visit (INDEPENDENT_AMBULATORY_CARE_PROVIDER_SITE_OTHER): Payer: Medicare HMO

## 2019-11-09 DIAGNOSIS — J309 Allergic rhinitis, unspecified: Secondary | ICD-10-CM

## 2019-11-30 ENCOUNTER — Ambulatory Visit (INDEPENDENT_AMBULATORY_CARE_PROVIDER_SITE_OTHER): Payer: Medicare HMO

## 2019-11-30 DIAGNOSIS — J309 Allergic rhinitis, unspecified: Secondary | ICD-10-CM

## 2019-11-30 NOTE — Progress Notes (Signed)
Mix down to [facility].

## 2019-12-14 ENCOUNTER — Ambulatory Visit: Payer: Self-pay

## 2019-12-16 ENCOUNTER — Ambulatory Visit (INDEPENDENT_AMBULATORY_CARE_PROVIDER_SITE_OTHER): Payer: Medicare HMO

## 2019-12-16 ENCOUNTER — Other Ambulatory Visit: Payer: Self-pay

## 2019-12-16 DIAGNOSIS — J4550 Severe persistent asthma, uncomplicated: Secondary | ICD-10-CM

## 2020-01-13 ENCOUNTER — Ambulatory Visit (INDEPENDENT_AMBULATORY_CARE_PROVIDER_SITE_OTHER): Payer: Medicare HMO | Admitting: *Deleted

## 2020-01-13 DIAGNOSIS — J309 Allergic rhinitis, unspecified: Secondary | ICD-10-CM | POA: Diagnosis not present

## 2020-01-26 ENCOUNTER — Ambulatory Visit (INDEPENDENT_AMBULATORY_CARE_PROVIDER_SITE_OTHER): Payer: Medicare HMO

## 2020-01-26 DIAGNOSIS — J309 Allergic rhinitis, unspecified: Secondary | ICD-10-CM | POA: Diagnosis not present

## 2020-01-31 ENCOUNTER — Ambulatory Visit (INDEPENDENT_AMBULATORY_CARE_PROVIDER_SITE_OTHER): Payer: Medicare HMO

## 2020-01-31 DIAGNOSIS — J309 Allergic rhinitis, unspecified: Secondary | ICD-10-CM | POA: Diagnosis not present

## 2020-02-10 ENCOUNTER — Ambulatory Visit (INDEPENDENT_AMBULATORY_CARE_PROVIDER_SITE_OTHER): Payer: Medicare HMO

## 2020-02-10 ENCOUNTER — Encounter: Payer: Self-pay | Admitting: Allergy and Immunology

## 2020-02-10 ENCOUNTER — Ambulatory Visit: Payer: Medicare HMO | Admitting: Allergy and Immunology

## 2020-02-10 ENCOUNTER — Other Ambulatory Visit: Payer: Self-pay

## 2020-02-10 VITALS — BP 104/60 | HR 86 | Temp 97.7°F | Resp 16

## 2020-02-10 DIAGNOSIS — J4540 Moderate persistent asthma, uncomplicated: Secondary | ICD-10-CM

## 2020-02-10 DIAGNOSIS — J4550 Severe persistent asthma, uncomplicated: Secondary | ICD-10-CM

## 2020-02-10 DIAGNOSIS — J3089 Other allergic rhinitis: Secondary | ICD-10-CM

## 2020-02-10 MED ORDER — AZELASTINE HCL 0.15 % NA SOLN: 1.0000 | Freq: Two times a day (BID) | NASAL | 5 refills | Status: AC | PRN

## 2020-02-10 NOTE — Patient Instructions (Addendum)
Moderate persistent asthma Well-controlled/stable.  Continue Fasenra injections every 8 weeks as prescribed.  Continue montelukast 10 mg daily at bedtime.  Continue albuterol every 4-6 hours if needed.  During asthma flares, add Symbicort 160-4.5 g, 2 inhalations via spacer device twice daily, and call the clinic.  Subjective and objective measures of pulmonary function will be followed and the treatment plan will be adjusted accordingly.  Allergic rhinitis Currently with suboptimal control.  Samples have been provided for [facility], 2 actuations per nostril twice daily for now.  This medication may be used as needed once her symptoms have improved.  Proper actuation technique has been demonstrated.  A prescription has been provided for azelastine nasal spray, 1-2 sprays per nostril 2 times daily as needed.   Nasal saline lavage (NeilMed) has been recommended as needed and prior to medicated nasal sprays along with instructions for proper administration.   Return in about 3 months (around 05/10/2020), or if symptoms worsen or fail to improve.

## 2020-02-10 NOTE — Progress Notes (Signed)
Follow-up Note  RE: Carolyn Benson MRN: [id number] DOB: 02/11/1956 Date of Office Visit: 02/10/2020  Primary care provider: Doreen Salvage, PA-C Referring provider: Doreen Salvage, PA-C  History of present illness: Carolyn Benson is a 64 y.o. female with persistent asthma, allergic rhinitis, and acid reflux presenting today for follow-up.  She was last seen in this clinic on August 11, 2019.  She reports that over the past 3 to 4 weeks she has had cold-like symptoms leading to nasal congestion and occasional sinus headaches.  She does not complain of fevers, chills, or discolored mucus production.  She believes the sinus pressure is primarily caused by the recent rapid weather changes.  She reports that her asthma is well controlled.  She rarely requires albuterol rescue and does not experience limitations of normal daily activities or nocturnal awakenings due to lower respiratory symptoms.  She notes that she was recently put on a course of a azithromycin and then a cephalosporin because of a urinary tract infection.  The UTI has not improved and so the urine has been cultured with results pending.  Assessment and plan: Moderate persistent asthma Well-controlled/stable.  Continue Fasenra injections every 8 weeks as prescribed.  Continue montelukast 10 mg daily at bedtime.  Continue albuterol every 4-6 hours if needed.  During asthma flares, add Symbicort 160-4.5 g, 2 inhalations via spacer device twice daily, and call the clinic.  Subjective and objective measures of pulmonary function will be followed and the treatment plan will be adjusted accordingly.  Allergic rhinitis Currently with suboptimal control.  Samples have been provided for [facility], 2 actuations per nostril twice daily for now.  This medication may be used as needed once her symptoms have improved.  Proper actuation technique has been demonstrated.  A prescription has been provided for azelastine nasal spray, 1-2  sprays per nostril 2 times daily as needed.   Nasal saline lavage (NeilMed) has been recommended as needed and prior to medicated nasal sprays along with instructions for proper administration.   Meds ordered this encounter  Medications  . Azelastine HCl 0.15 % SOLN    Sig: Place 1-2 sprays into both nostrils 2 (two) times daily as needed.    Dispense:  30 mL    Refill:  5    Diagnostics: FVC was 1.66 L and FEV1 was 1.36 L.  Predicted FVC 307, predicted FEV1 2.35 L.  Moderate restriction consistent with previous study.  Please see scanned spirometry results for details.    Physical examination: Blood pressure 104/60, pulse 86, temperature 97.7 F (36.5 C), temperature source Tympanic, resp. rate 16, SpO2 96 %.  General: Alert, interactive, in no acute distress. HEENT: TMs pearly gray, turbinates moderately edematous without discharge, post-pharynx moderately erythematous. Neck: Supple without lymphadenopathy. Lungs: Clear to auscultation without wheezing, rhonchi or rales. CV: Normal S1, S2 without murmurs. Skin: Warm and dry, without lesions or rashes.  The following portions of the patient's history were reviewed and updated as appropriate: allergies, current medications, past family history, past medical history, past social history, past surgical history and problem list.  Current Outpatient Medications  Medication Sig Dispense Refill  . albuterol (PROVENTIL) (2.5 MG/3ML) 0.083% nebulizer solution Take 3 mLs (2.5 mg total) by nebulization every 4 (four) hours as needed for wheezing or shortness of breath. 75 mL 1  . albuterol (VENTOLIN HFA) 108 (90 Base) MCG/ACT inhaler INHALE 2 PUFFS INTO THE LUNGS EVERY 4 (FOUR) HOURS AS NEEDED FOR WHEEZING OR SHORTNESS OF BREATH. 54 each  0  . atenolol (TENORMIN) 25 MG tablet Take 25 mg by mouth daily.    . Azelastine HCl 0.15 % SOLN Place 1-2 sprays into both nostrils 2 (two) times daily as needed. 30 mL 5  . Benralizumab (FASENRA) 30 MG/ML  SOSY Inject 30 mg into the skin every 8 (eight) weeks.    . budesonide-formoterol (SYMBICORT) 160-4.5 MCG/ACT inhaler Inhale 2 puffs into the lungs daily. 1 Inhaler 5  . busPIRone (BUSPAR) 10 MG tablet Take 20 mg by mouth 2 (two) times daily.    . carbamazepine (TEGRETOL XR) 100 MG 12 hr tablet     . Continuous Blood Gluc Sensor (DEXCOM G6 SENSOR) MISC Apply 1 sensor to the skin every 10 days for continuous glucose monitoring.    Marland Kitchen dexlansoprazole (DEXILANT) 60 MG capsule Take 60 mg by mouth daily.    . Diethylpropion HCl CR 75 MG TB24 Take 75 mg by mouth daily.     Marland Kitchen doxepin (SINEQUAN) 25 MG capsule Take 25 mg by mouth at bedtime.    Marland Kitchen escitalopram (LEXAPRO) 10 MG tablet Take 10 mg by mouth daily.     Marland Kitchen estrogens-methylTEST (ESTRATEST) 1.25-2.5 MG tablet Take 1 tablet by mouth daily.     . fluticasone (FLONASE) 50 MCG/ACT nasal spray USE 2 SPRAYS IN EACH NOSTRIL EVERY DAY (Patient taking differently: Place 2 sprays into both nostrils as needed.) 48 g 2  . gabapentin (NEURONTIN) 600 MG tablet Take 1,200 mg by mouth 2 (two) times daily.    Marland Kitchen glucose blood test strip by Misc.(Non-Drug; Combo Route) route 4 times daily.    Marland Kitchen HYDROcodone-acetaminophen (NORCO) 10-325 MG tablet Take 1 tablet by mouth 4 (four) times daily as needed for moderate pain. 40 tablet 0  . insulin aspart (NOVOLOG) 100 UNIT/ML injection Use as directed in insulin pump.  MDD: 135 units/day    . Insulin Infusion Pump DEVI 80 Units by Insulin Pump route once a week.    . levocetirizine (XYZAL) 5 MG tablet Take 5 mg by mouth every evening.     Marland Kitchen levothyroxine (SYNTHROID, LEVOTHROID) 50 MCG tablet Take 50 mcg by mouth daily before breakfast.     . linaclotide (LINZESS) 290 MCG CAPS capsule Take 290 mcg by mouth daily before breakfast.     . LORazepam (ATIVAN) 0.5 MG tablet Take 0.5-1 mg by mouth at bedtime as needed for anxiety.     Marland Kitchen lubiprostone (AMITIZA) 8 MCG capsule Take 8 mcg by mouth 2 (two) times daily with a meal.    .  methocarbamol (ROBAXIN) 500 MG tablet Take 1 tablet (500 mg total) by mouth every 6 (six) hours as needed for muscle spasms. 40 tablet 3  . metolazone (ZAROXOLYN) 2.5 MG tablet Take 2.5 mg by mouth 2 (two) times daily.    . metoprolol succinate (TOPROL-XL) 25 MG 24 hr tablet Take 25 mg by mouth daily.     . montelukast (SINGULAIR) 10 MG tablet Take 10 mg by mouth daily.    . Potassium Chloride ER 20 MEQ TBCR Take 20 mEq by mouth daily.    . pregabalin (LYRICA) 150 MG capsule Take 150 mg by mouth 3 (three) times daily.   3  . promethazine (PHENERGAN) 25 MG tablet Take 25 mg by mouth 3 (three) times daily as needed.  0  . QUEtiapine (SEROQUEL) 100 MG tablet Take 100 mg by mouth at bedtime.    . rosuvastatin (CRESTOR) 10 MG tablet Take 10 mg by mouth daily.     Marland Kitchen  Semaglutide,0.25 or 0.5MG /DOS, (OZEMPIC, 0.25 OR 0.5 MG/DOSE,) 2 MG/1.5ML SOPN     . sertraline (ZOLOFT) 50 MG tablet Take 50 mg by mouth daily.     Marland Kitchen topiramate (TOPAMAX) 25 MG tablet Take 25 mg by mouth daily.     Marland Kitchen acyclovir (ZOVIRAX) 400 MG tablet Take 400 mg by mouth 2 (two) times daily.   (Patient not taking: Reported on 02/10/2020)    . EPINEPHrine 0.3 mg/0.3 mL IJ SOAJ injection Use as directed for severe allergic reactions. (Patient not taking: Reported on 02/10/2020) 2 each 1   Current Facility-Administered Medications  Medication Dose Route Frequency Provider Last Rate Last Admin  . Benralizumab SOSY 30 mg  30 mg Subcutaneous Q8 Weeks Hetty Blend, FNP   30 mg at 02/10/20 1013  . predniSONE (DELTASONE) tablet 10 mg  10 mg Oral Q breakfast Hyacinth Marcelli, Heywood Iles, MD        Allergies  Allergen Reactions  . Ibuprofen Swelling  . Ciprofloxacin Rash  . Clarithromycin Nausea And Vomiting  . Codeine Rash    Swelling  . Doxycycline Nausea And Vomiting  . Metoclopramide Rash    PT DOES NOT KNOW  . Omnipaque [Iohexol] Other (See Comments)    Kidneys bleed Kidneys bleed  . Sulfasalazine Rash  . Phenylpropanol Nausea And  Vomiting  . Butorphanol Nausea Only  . Butorphanol Tartrate Nausea And Vomiting and Nausea Only  . Darvocet [Propoxyphene N-Acetaminophen] Nausea Only  . Other Rash    Fluoroquinolones  . Ranitidine Hcl Rash  . Stadol [Butorphanol Tartrate] Nausea Only  . Sulfa Antibiotics Rash   Review of systems: Review of systems negative except as noted in HPI / PMHx.  Past Medical History:  Diagnosis Date  . Anxiety   . Asthma   . Back pain   . Depression   . Diabetes mellitus    type 2 with insulin pump  . GERD (gastroesophageal reflux disease)   . Hypertension   . Hypokalemia   . Lumbar spondylosis    and degenerative disc disease causing mild impingement at L4-5 and L5-S1.  Marland Kitchen PONV (postoperative nausea and vomiting)    Severe  . Sleep apnea    Study > 10-15 yeara ago, patient refuesed CPAP  . Weakness of back    and legs    Family History  Problem Relation Age of Onset  . Asthma Mother   . Allergic rhinitis Father   . Asthma Brother   . Angioedema Neg Hx   . Atopy Neg Hx   . Eczema Neg Hx   . Immunodeficiency Neg Hx     Social History   Socioeconomic History  . Marital status: Widowed    Spouse name: Not on file  . Number of children: Not on file  . Years of education: Not on file  . Highest education level: Not on file  Occupational History  . Not on file  Tobacco Use  . Smoking status: Never Smoker  . Smokeless tobacco: Never Used  Vaping Use  . Vaping Use: Never used  Substance and Sexual Activity  . Alcohol use: No  . Drug use: No  . Sexual activity: Never  Other Topics Concern  . Not on file  Social History Narrative  . Not on file   Social Determinants of Health   Financial Resource Strain: Not on file  Food Insecurity: Not on file  Transportation Needs: Not on file  Physical Activity: Not on file  Stress: Not on file  Social Connections: Not on file  Intimate Partner Violence: Not on file    I appreciate the opportunity to take part in  Auriella's care. Please do not hesitate to contact me with questions.  Sincerely,   R. Jorene Guestarter Philomene Haff, MD

## 2020-02-10 NOTE — Assessment & Plan Note (Signed)
Currently with suboptimal control.  Samples have been provided for [facility], 2 actuations per nostril twice daily for now.  This medication may be used as needed once her symptoms have improved.  Proper actuation technique has been demonstrated.  A prescription has been provided for azelastine nasal spray, 1-2 sprays per nostril 2 times daily as needed.   Nasal saline lavage (NeilMed) has been recommended as needed and prior to medicated nasal sprays along with instructions for proper administration.

## 2020-02-10 NOTE — Assessment & Plan Note (Signed)
Well-controlled/stable.  Continue Fasenra injections every 8 weeks as prescribed.  Continue montelukast 10 mg daily at bedtime.  Continue albuterol every 4-6 hours if needed.  During asthma flares, add Symbicort 160-4.5 g, 2 inhalations via spacer device twice daily, and call the clinic.  Subjective and objective measures of pulmonary function will be followed and the treatment plan will be adjusted accordingly.

## 2020-02-15 ENCOUNTER — Ambulatory Visit (INDEPENDENT_AMBULATORY_CARE_PROVIDER_SITE_OTHER): Payer: Medicare HMO

## 2020-02-15 DIAGNOSIS — J309 Allergic rhinitis, unspecified: Secondary | ICD-10-CM

## 2020-02-21 ENCOUNTER — Ambulatory Visit (INDEPENDENT_AMBULATORY_CARE_PROVIDER_SITE_OTHER): Payer: Medicare HMO

## 2020-02-21 DIAGNOSIS — J309 Allergic rhinitis, unspecified: Secondary | ICD-10-CM | POA: Diagnosis not present

## 2020-02-29 ENCOUNTER — Ambulatory Visit (INDEPENDENT_AMBULATORY_CARE_PROVIDER_SITE_OTHER): Payer: Medicare HMO

## 2020-02-29 DIAGNOSIS — J309 Allergic rhinitis, unspecified: Secondary | ICD-10-CM

## 2020-03-08 ENCOUNTER — Ambulatory Visit (INDEPENDENT_AMBULATORY_CARE_PROVIDER_SITE_OTHER): Payer: Medicare HMO

## 2020-03-08 DIAGNOSIS — J309 Allergic rhinitis, unspecified: Secondary | ICD-10-CM | POA: Diagnosis not present

## 2020-03-17 ENCOUNTER — Ambulatory Visit (INDEPENDENT_AMBULATORY_CARE_PROVIDER_SITE_OTHER): Payer: Medicare HMO

## 2020-03-17 DIAGNOSIS — J309 Allergic rhinitis, unspecified: Secondary | ICD-10-CM

## 2020-03-28 ENCOUNTER — Ambulatory Visit: Payer: Self-pay

## 2020-04-06 ENCOUNTER — Ambulatory Visit: Payer: Medicare HMO

## 2020-04-11 ENCOUNTER — Telehealth: Payer: Self-pay | Admitting: *Deleted

## 2020-04-11 NOTE — Telephone Encounter (Signed)
Patient had called me in ref to her [facility] Ch[facility]enger not delivered to [facility] clinic.  I reached to [place] and they could not advise why Rx not shipped but rescheduled same.  I L/m for patient that we had received sample in [place] and her name is on it and can call them tomorrow to schedule to come in for injection and we will put hers back to sample when it arrives 3/4

## 2020-04-12 ENCOUNTER — Other Ambulatory Visit: Payer: Self-pay

## 2020-04-12 ENCOUNTER — Ambulatory Visit (INDEPENDENT_AMBULATORY_CARE_PROVIDER_SITE_OTHER): Payer: Medicare HMO

## 2020-04-12 DIAGNOSIS — J4540 Moderate persistent asthma, uncomplicated: Secondary | ICD-10-CM

## 2020-04-18 ENCOUNTER — Ambulatory Visit (INDEPENDENT_AMBULATORY_CARE_PROVIDER_SITE_OTHER): Payer: Medicare HMO

## 2020-04-18 DIAGNOSIS — J309 Allergic rhinitis, unspecified: Secondary | ICD-10-CM | POA: Diagnosis not present

## 2020-04-24 DIAGNOSIS — J3089 Other allergic rhinitis: Secondary | ICD-10-CM

## 2020-04-24 NOTE — Progress Notes (Signed)
Vials exp 04-24-21 

## 2020-05-12 ENCOUNTER — Ambulatory Visit: Payer: Medicare HMO | Admitting: Allergy and Immunology

## 2020-05-12 ENCOUNTER — Other Ambulatory Visit: Payer: Self-pay | Admitting: Family Medicine

## 2020-05-12 ENCOUNTER — Encounter: Payer: Self-pay | Admitting: Allergy and Immunology

## 2020-05-12 ENCOUNTER — Other Ambulatory Visit: Payer: Self-pay

## 2020-05-12 ENCOUNTER — Ambulatory Visit: Payer: Self-pay | Admitting: *Deleted

## 2020-05-12 VITALS — BP 110/60 | HR 100 | Temp 97.9°F | Resp 18

## 2020-05-12 DIAGNOSIS — B370 Candidal stomatitis: Secondary | ICD-10-CM

## 2020-05-12 DIAGNOSIS — J3089 Other allergic rhinitis: Secondary | ICD-10-CM | POA: Diagnosis not present

## 2020-05-12 DIAGNOSIS — K219 Gastro-esophageal reflux disease without esophagitis: Secondary | ICD-10-CM

## 2020-05-12 DIAGNOSIS — J4551 Severe persistent asthma with (acute) exacerbation: Secondary | ICD-10-CM

## 2020-05-12 MED ORDER — NYSTATIN 100000 UNIT/ML MT SUSP: OROMUCOSAL | 1 refills | Status: DC

## 2020-05-12 MED ORDER — AMOXICILLIN-POT CLAVULANATE 875-125 MG PO TABS: ORAL_TABLET | ORAL | 0 refills | Status: DC

## 2020-05-12 MED ORDER — BREZTRI AEROSPHERE 160-9-4.8 MCG/ACT IN AERO: INHALATION_SPRAY | RESPIRATORY_TRACT | 5 refills | Status: DC

## 2020-05-12 MED ORDER — METHYLPREDNISOLONE ACETATE 80 MG/ML IJ SUSP
80.0000 mg | Freq: Once | INTRAMUSCULAR | Status: AC
Start: 2020-05-12 — End: 2020-05-12
  Administered 2020-05-12: 80 mg via INTRAMUSCULAR

## 2020-05-12 MED ORDER — AEROCHAMBER PLUS MISC
2 refills | Status: AC
Start: 2020-05-12 — End: ?

## 2020-05-12 MED ORDER — XHANCE 93 MCG/ACT NA EXHU: INHALANT_SUSPENSION | NASAL | 5 refills | Status: DC

## 2020-05-12 NOTE — Progress Notes (Signed)
[name]   Follow-up Note  Referring Provider: Doreen Salvage, [place] Primary Provider: Perley Jain Date of Office Visit: 05/12/2020  Subjective:   Carolyn Benson (DOB: 02-28-1955) is a 65 y.o. female who [facility] in re-evaluation of the following:  HPI: Carolyn Benson returns to this clinic in evaluation of asthma and allergic rhinitis and reflux.  Her last visit to this clinic was 10 February 2020 with Dr. Nunzio Cobbs.  I have never seen her in this clinic.  Apparently over the course of the past 2 weeks she has been using her short acting bronchodilator twice or 3 times a day for issues with coughing and wheezing.  In addition she has had some sneezing and she has a right-sided headache.  This headache is over her nasal area and her right eye.  She has been taking Excedrin multiple times per day for this issue.  She has noted some green and yellow postnasal drip and some gagging and some raspy voice with this issue.  She is not had any fever.  She has not had any chest pain.  She has started her Symbicort.  She continues to consistently use her benralizumab injections and montelukast.  She also remains on [facility] on an as-needed basis.  She has also had a flare of her reflux disease and she has increased her Dexilant to twice a day.  And she has noticed that she has developed a white coat on her tongue since she restarted her Symbicort over the course of the past 2 weeks.  She has received 3 COVID vaccines.  Allergies as of 05/12/2020      Reactions   Ibuprofen Swelling   Ciprofloxacin Rash   Clarithromycin Nausea And Vomiting   Codeine Rash   Swelling   Doxycycline Nausea And Vomiting   Metoclopramide Rash   PT DOES NOT KNOW   Omnipaque [iohexol] Other (See Comments)   Kidneys bleed Kidneys bleed   Sulfasalazine Rash   Phenylpropanol Nausea And Vomiting   Butorphanol Nausea Only    Butorphanol Tartrate Nausea And Vomiting, Nausea Only   Darvocet [propoxyphene N-acetaminophen] Nausea Only   Other Rash   Fluoroquinolones    Ranitidine Hcl Rash   Stadol [butorphanol Tartrate] Nausea Only   Sulfa Antibiotics Rash      Medication List      acyclovir 400 MG tablet Commonly known as: ZOVIRAX Take 400 mg by mouth 2 (two) times daily.   albuterol (2.5 MG/3ML) 0.083% nebulizer solution Commonly known as: PROVENTIL Take 3 mLs (2.5 mg total) by nebulization every 4 (four) hours as needed for wheezing or shortness of breath.   albuterol 108 (90 Base) MCG/ACT inhaler Commonly known as: VENTOLIN HFA INHALE 2 PUFFS INTO THE LUNGS EVERY 4 (FOUR) HOURS AS NEEDED FOR WHEEZING OR SHORTNESS OF BREATH.   atenolol 25 MG tablet Commonly known as: TENORMIN Take 25 mg by mouth daily.   Azelastine HCl 0.15 % Soln Place 1-2 sprays into both nostrils 2 (two) times daily as needed.   budesonide-formoterol 160-4.5 MCG/ACT inhaler Commonly known as: Symbicort Inhale 2 puffs into the lungs daily.   busPIRone 10 MG tablet Commonly known as: BUSPAR Take 20 mg by mouth 2 (two) times daily.   carbamazepine 100 MG 12 hr tablet Commonly known as: TEGRETOL XR   Dexcom G6 Sensor Misc Apply 1 sensor to the skin every 10 days for continuous glucose monitoring.  dexlansoprazole 60 MG capsule Commonly known as: DEXILANT Take 60 mg by mouth daily.   Diethylpropion HCl CR 75 MG Tb24 Take 75 mg by mouth daily.   doxepin 25 MG capsule Commonly known as: SINEQUAN Take 25 mg by mouth at bedtime.   EPINEPHrine 0.3 mg/0.3 mL Soaj injection Commonly known as: EPI-PEN Use as directed for severe allergic reactions.   escitalopram 10 MG tablet Commonly known as: LEXAPRO Take 10 mg by mouth daily.   estrogens-methylTEST 1.25-2.5 MG tablet Commonly known as: ESTRATEST Take 1 tablet by mouth daily.   Fasenra 30 MG/ML Sosy Generic drug: Benralizumab Inject 30 mg into the skin every 8  (eight) weeks.   fluticasone 50 MCG/ACT nasal spray Commonly known as: FLONASE USE 2 SPRAYS IN EACH NOSTRIL EVERY DAY   gabapentin 600 MG tablet Commonly known as: NEURONTIN Take 1,200 mg by mouth 2 (two) times daily.   glucose blood test strip by Misc.(Non-Drug; Combo Route) route 4 times daily.   HYDROcodone-acetaminophen 10-325 MG tablet Commonly known as: NORCO Take 1 tablet by mouth 4 (four) times daily as needed for moderate pain.   insulin aspart 100 UNIT/ML injection Commonly known as: novoLOG Use as directed in insulin pump.  MDD: 135 units/day   Insulin Infusion Pump Devi 80 Units by Insulin Pump route once a week.   levocetirizine 5 MG tablet Commonly known as: XYZAL Take 5 mg by mouth every evening.   levothyroxine 50 MCG tablet Commonly known as: SYNTHROID Take 50 mcg by mouth daily before breakfast.   linaclotide 290 MCG Caps capsule Commonly known as: LINZESS Take 290 mcg by mouth daily before breakfast.   LORazepam 0.5 MG tablet Commonly known as: ATIVAN Take 0.5-1 mg by mouth at bedtime as needed for anxiety.   lubiprostone 8 MCG capsule Commonly known as: AMITIZA Take 8 mcg by mouth 2 (two) times daily with a meal.   methocarbamol 500 MG tablet Commonly known as: ROBAXIN Take 1 tablet (500 mg total) by mouth every 6 (six) hours as needed for muscle spasms.   metolazone 2.5 MG tablet Commonly known as: ZAROXOLYN Take 2.5 mg by mouth 2 (two) times daily.   metoprolol succinate 25 MG 24 hr tablet Commonly known as: TOPROL-XL Take 25 mg by mouth daily.   montelukast 10 MG tablet Commonly known as: SINGULAIR Take 10 mg by mouth daily.   Ozempic (0.25 or 0.5 MG/DOSE) 2 MG/1.5ML Sopn Generic drug: Semaglutide(0.25 or 0.5MG /DOS)   Potassium Chloride ER 20 MEQ Tbcr Take 20 mEq by mouth daily.   pregabalin 150 MG capsule Commonly known as: LYRICA Take 150 mg by mouth 3 (three) times daily.   promethazine 25 MG tablet Commonly known as:  PHENERGAN Take 25 mg by mouth 3 (three) times daily as needed.   QUEtiapine 100 MG tablet Commonly known as: SEROQUEL Take 100 mg by mouth at bedtime.   rosuvastatin 10 MG tablet Commonly known as: CRESTOR Take 10 mg by mouth daily.   sertraline 50 MG tablet Commonly known as: ZOLOFT Take 50 mg by mouth daily.   topiramate 25 MG tablet Commonly known as: TOPAMAX Take 25 mg by mouth daily.       Past Medical History:  Diagnosis Date  . Anxiety   . Asthma   . Back pain   . Depression   . Diabetes mellitus    type 2 with insulin pump  . GERD (gastroesophageal reflux disease)   . Hypertension   . Hypokalemia   . Lumbar  spondylosis    and degenerative disc disease causing mild impingement at L4-5 and L5-S1.  Marland Kitchen PONV (postoperative nausea and vomiting)    Severe  . Sleep apnea    Study > 10-15 yeara ago, patient refuesed CPAP  . Weakness of back    and legs    Past Surgical History:  Procedure Laterality Date  . ABDOMINAL EXPOSURE N/A 09/07/2018   Procedure: ABDOMINAL EXPOSURE;  Surgeon: Larina Earthly, MD;  Location: [facility] OR;  Service: Vascular;  Laterality: N/A;  ABDOMINAL EXPOSURE  . ABDOMINAL HYSTERECTOMY    . ANTERIOR LUMBAR FUSION N/A 09/07/2018   Procedure: Lumbar Four-Five Lumbar Five Sacral One Anterior lumbar interbody fusion;  Surgeon: Barnett Abu, MD;  Location: [facility] OR;  Service: Neurosurgery;  Laterality: N/A;  Lumbar Four-Five Lumbar Five Sacral One Anterior lumbar interbody fusion  . APPENDECTOMY    . BACK SURGERY     Right Laminectomy at L4-5  . CHOLECYSTECTOMY    . NECK SURGERY     x2    Review of systems negative except as noted in HPI / PMHx or noted below:  Review of Systems  Constitutional: Negative.   HENT: Negative.   Eyes: Negative.   Respiratory: Negative.   Cardiovascular: Negative.   Gastrointestinal: Negative.   Genitourinary: Negative.   Musculoskeletal: Negative.   Skin: Negative.   Neurological: Negative.    Endo/Heme/Allergies: Negative.   Psychiatric/Behavioral: Negative.      Objective:   Vitals:   05/12/20 1021  BP: 110/60  Pulse: 100  Resp: 18  Temp: 97.9 F (36.6 C)  SpO2: 95%          Physical Exam Constitutional:      Appearance: She is not diaphoretic.  HENT:     Head: Normocephalic.     Right Ear: Tympanic membrane, ear canal and external ear normal.     Left Ear: Tympanic membrane, ear canal and external ear normal.     Nose: Nose normal. No mucosal edema or rhinorrhea.     Mouth/Throat:     Tongue: Lesions (White coating throughout) present.     Pharynx: Uvula midline. No oropharyngeal exudate.  Eyes:     Conjunctiva/sclera: Conjunctivae normal.  Neck:     Thyroid: No thyromegaly.     Trachea: Trachea normal. No tracheal tenderness or tracheal deviation.  Cardiovascular:     Rate and Rhythm: Normal rate and regular rhythm.     Heart sounds: Normal heart sounds, S1 normal and S2 normal. No murmur heard.   Pulmonary:     Effort: No respiratory distress.     Breath sounds: Normal breath sounds. No stridor. No wheezing or rales.  Lymphadenopathy:     Head:     Right side of head: No tonsillar adenopathy.     Left side of head: No tonsillar adenopathy.     Cervical: No cervical adenopathy.  Skin:    Findings: No erythema or rash.     Nails: There is no clubbing.  Neurological:     Mental Status: She is alert.     Diagnostics: none  Assessment and Plan:   1. Asthma, not well controlled, severe persistent, with acute exacerbation   2. Perennial allergic rhinitis   3. Gastroesophageal reflux disease, unspecified whether esophagitis present   4. Thrush     1.  Depo-Medrol 80 IM delivered in clinic today.  Check blood sugars  2.  Augmentin 875 1 tablet twice a day for the 10 days  3.  Breztri -2 inhalations twice a day with spacer.  Replaces Symbicort  4.  XHANCE -1 spray each nostril twice a day  5.  Nystatin-5 mL swish and swallow twice a  day after Breztri  6.  Continue Dexilant 60 mg - 1 tablet twice a day  7. Continue Montelukast 10 mg - 1 tablet 1 time per day  8.  Continue Fasenra/benralizumab injections every 8 weeks  9.  If needed:   A. Xyzal  B. Albuterol HFA  C. Nasal saline  10. Return to clinic in 3 weeks or earlier if problem  Carolyn Benson will utilize the therapy noted above and I will assume that she will resolve what appears to be an episode of sinusitis triggering off her asthma and complicated by a more active reflux disease.  Assuming she does well with the plan noted above I will see her back in this clinic in 3 weeks and there may be a opportunity to consolidate her medical therapy with that visit.  Laurette Schimke, MD Allergy / Immunology [facility]

## 2020-05-12 NOTE — Patient Instructions (Addendum)
  1.  Depo-Medrol 80 IM delivered in clinic today.  Check blood sugars  2.  Augmentin 875 1 tablet twice a day for the 10 days  3.  Breztri -2 inhalations twice a day with spacer.  Replaces Symbicort  4.  XHANCE -1 spray each nostril twice a day  5.  Nystatin-5 mL swish and swallow twice a day after Breztri  6.  Continue Dexilant 60 mg - 1 tablet twice a day  7. Continue Montelukast 10 mg - 1 tablet 1 time per day  8.  Continue Fasenra/benralizumab injections every 8 weeks  9.  If needed:   A. Xyzal  B. Albuterol HFA  C. Nasal saline  10. Return to clinic in 3 weeks or earlier if problem

## 2020-05-15 ENCOUNTER — Encounter: Payer: Self-pay | Admitting: Allergy and Immunology

## 2020-05-19 ENCOUNTER — Ambulatory Visit (INDEPENDENT_AMBULATORY_CARE_PROVIDER_SITE_OTHER): Payer: Medicare HMO

## 2020-05-19 DIAGNOSIS — J309 Allergic rhinitis, unspecified: Secondary | ICD-10-CM

## 2020-05-24 ENCOUNTER — Telehealth: Payer: Self-pay | Admitting: *Deleted

## 2020-05-24 NOTE — Telephone Encounter (Signed)
PA has been submitted via fax for [facility] and the form has been placed in pending in the [place] nurses station.

## 2020-06-05 ENCOUNTER — Ambulatory Visit: Payer: Medicare HMO | Admitting: Family Medicine

## 2020-06-05 ENCOUNTER — Encounter: Payer: Self-pay | Admitting: Family Medicine

## 2020-06-05 ENCOUNTER — Other Ambulatory Visit: Payer: Self-pay

## 2020-06-05 ENCOUNTER — Telehealth: Payer: Self-pay

## 2020-06-05 VITALS — BP 108/66 | HR 76 | Temp 97.2°F | Resp 12 | Ht 63.0 in | Wt 222.2 lb

## 2020-06-05 DIAGNOSIS — J4550 Severe persistent asthma, uncomplicated: Secondary | ICD-10-CM | POA: Diagnosis not present

## 2020-06-05 DIAGNOSIS — K219 Gastro-esophageal reflux disease without esophagitis: Secondary | ICD-10-CM

## 2020-06-05 DIAGNOSIS — J3089 Other allergic rhinitis: Secondary | ICD-10-CM

## 2020-06-05 DIAGNOSIS — J309 Allergic rhinitis, unspecified: Secondary | ICD-10-CM

## 2020-06-05 DIAGNOSIS — H60503 Unspecified acute noninfective otitis externa, bilateral: Secondary | ICD-10-CM | POA: Insufficient documentation

## 2020-06-05 MED ORDER — ALBUTEROL SULFATE HFA 108 (90 BASE) MCG/ACT IN AERS: INHALATION_SPRAY | RESPIRATORY_TRACT | 1 refills | Status: DC

## 2020-06-05 MED ORDER — NEOMYCIN-POLYMYXIN-HC 3.5-10000-1 OT SOLN: 4.0000 [drp] | Freq: Four times a day (QID) | OTIC | 0 refills | Status: AC

## 2020-06-05 MED ORDER — AZELASTINE HCL 0.1 % NA SOLN: NASAL | 5 refills | Status: DC

## 2020-06-05 NOTE — Progress Notes (Signed)
[address] [place] [address] Dept: [phone]  FOLLOW UP NOTE  Patient ID: Carolyn Benson, female    DOB: [birth year]  Age: 65 y.o. MRN: [id number] Date of Office Visit: 06/05/2020  Assessment  Chief Complaint: Allergic Rhinitis  and Asthma  HPI Carolyn Benson is a 65 year old female who presents to the clinic for follow-up visit.  She was last seen in this clinic on 05/12/2020 for evaluation of asthma, allergic rhinitis, reflux, and oral candidiasis.  At that time, she received a Depo-Medrol injection as well as Augmentin with resolution of symptoms.  At today's visit, she reports her asthma has been moderately well controlled with no shortness of breath or cough with activity or rest.  She does report a little wheeze occurring mostly overnight.  She continues montelukast 10 mg once a day and uses albuterol about once or twice a week.  She reports that her insurance did not cover Breztri and so she has not been taking this medication.  Allergic rhinitis is reported as moderately well controlled with symptoms including intermittent headache, nasal congestion, and sneezing for which she continues Xyzal 5 mg once a day and azelastine as needed.  She reports that her insurance did not cover Xhance and so she has not been using this medication.  She continues allergen immunotherapy with no large local reactions.  She reports a significant decrease in her symptoms of allergic rhinitis while continuing on allergen immunotherapy.  Reflux is reported as well controlled with Dexilant 60 mg once a day.  She reports that the white patches on her tongue have resolved with nystatin swish and swallow shortly after her last visit.  She is not currently using nystatin swish and swallow. She does report that she has been experiencing scab formation in both ear canals for the last month. She does report some local pain when touching the outer portion of the ear canals. She denies deeper ear pain,  discharge, or change in hearing. Her current medications are listed in the chart.   Drug Allergies:  Allergies  Allergen Reactions  . Ibuprofen Swelling  . Ciprofloxacin Rash  . Clarithromycin Nausea And Vomiting  . Codeine Rash    Swelling  . Doxycycline Nausea And Vomiting  . Metoclopramide Rash    PT DOES NOT KNOW  . Omnipaque [Iohexol] Other (See Comments)    Kidneys bleed Kidneys bleed  . Sulfasalazine Rash  . Phenylpropanol Nausea And Vomiting  . Butorphanol Nausea Only  . Butorphanol Tartrate Nausea And Vomiting and Nausea Only  . Darvocet [Propoxyphene N-Acetaminophen] Nausea Only  . Other Rash    Fluoroquinolones    . Ranitidine Hcl Rash  . Stadol [Butorphanol Tartrate] Nausea Only  . Sulfa Antibiotics Rash    Physical Exam: BP 108/66 (BP Location: Right Arm, Patient Position: Sitting, Cuff Size: Normal)   Pulse 76   Temp (!) 97.2 F (36.2 C) (Tympanic)   Resp 12   Ht 5\' 3"  (1.6 m)   Wt 222 lb 3.2 oz (100.8 kg)   SpO2 98%   BMI 39.36 kg/m    Physical Exam Vitals reviewed.  Constitutional:      Appearance: Normal appearance.  HENT:     Head: Normocephalic and atraumatic.     Ears:     Comments: Bilateral ear canals slightly erythematous with copious cerumen    Nose:     Comments: Bilateral nares slightly erythematous with clear nasal drainage noted.  Pharynx normal.  Ears normal.  Eyes normal.  Mouth/Throat:     Pharynx: Oropharynx is clear.  Cardiovascular:     Rate and Rhythm: Normal rate and regular rhythm.     Heart sounds: Normal heart sounds. No murmur heard.   Pulmonary:     Effort: Pulmonary effort is normal.     Breath sounds: Normal breath sounds.     Comments: Lungs clear to auscultation Musculoskeletal:        General: Normal range of motion.     Cervical back: Normal range of motion and neck supple.  Skin:    General: Skin is warm and dry.  Neurological:     Mental Status: She is alert and oriented to person, place, and time.   Psychiatric:        Mood and Affect: Mood normal.        Behavior: Behavior normal.        Thought Content: Thought content normal.        Judgment: Judgment normal.     Diagnostics: FVC 1.81, FEV1 1.53.  Predicted FVC 3.04, predicted FEV1 2.32.  Spirometry indicates moderate restriction.  This is consistent with previous spirometry readings.  Assessment and Plan: 1. Severe persistent asthma without complication   2. Perennial allergic rhinitis   3. Gastroesophageal reflux disease, unspecified whether esophagitis present   4. Acute otitis externa of both ears, unspecified type     Meds ordered this encounter  Medications  . albuterol (VENTOLIN HFA) 108 (90 Base) MCG/ACT inhaler    Sig: INHALE 2 PUFFS INTO THE LUNGS EVERY 4 HOURS AS NEEDED FOR WHEEZE OR FOR SHORTNESS OF BREATH    Dispense:  1 each    Refill:  1  . azelastine (ASTELIN) 0.1 % nasal spray    Sig: 1-2 sprays per nostril twice daily for runny nose    Dispense:  30 mL    Refill:  5  . neomycin-polymyxin-hydrocortisone (CORTISPORIN) OTIC solution    Sig: Place 4 drops into both ears 4 (four) times daily for 7 days.    Dispense:  10 mL    Refill:  0    Patient Instructions  Asthma  Continue montelukast 10 mg once a day to prevent cough or wheeze Continue albuterol inhaler 2 puffs every 4 hours as needed or albuterol via nebulizer 1 unit dose once every 4 hours as needed for cough or wheeze Continue Fasenra 30 mg once every 8 weeks and have an epinephrine auto-injector set at all times For asthma flares, add Symbicort 160-2 puffs twice a day with a spacer to prevent cough or wheeze  Allergic rhinitis Continue with your allergy injections on the current schedule of once every 4 weeks and have access to an epinephrine auto-injector set Continue Flonase nasal spray as needed for a stuffy nose. In the right nostril, point the applicator out toward the right ear. In the left nostril, point the applicator out toward the  left ear Continue Xyzal once a day as needed for a runny nose.  Begin nasal saline rinses at least once a day Continue allergen avoidance measures directed toward mold as listed below  Reflux Continue Dexilant 60 mg as ordered to decrease reflux. This may help with wheezing Continue lifestyle modifications to decrease reflux  Otitis externa Begin cortisporin 4 drops in each ear four times a day for 7 days If no improvement in your symptoms after 7 days, follow up with ENT for further evaluation   Continue the other medications as listed in your chart.  Call me  if this plan is not working well for you  Follow up in 6 or sooner if needed    Return in about 6 months (around 12/05/2020), or if symptoms worsen or fail to improve.    Thank you for the opportunity to care for this patient.  Please do not hesitate to contact me with questions.  Thermon Leyland, FNP [facility]

## 2020-06-05 NOTE — Telephone Encounter (Signed)
Left message for patient to let her know that we changed her ear drops to cortisporin 4 drops in each each ear four times a day for 7 days and will send out a new after visit summary.

## 2020-06-05 NOTE — Patient Instructions (Addendum)
Asthma  Continue montelukast 10 mg once a day to prevent cough or wheeze Continue albuterol inhaler 2 puffs every 4 hours as needed or albuterol via nebulizer 1 unit dose once every 4 hours as needed for cough or wheeze Continue Fasenra 30 mg once every 8 weeks and have an epinephrine auto-injector set at all times For asthma flares, add Symbicort 160-2 puffs twice a day with a spacer to prevent cough or wheeze  Allergic rhinitis Continue with your allergy injections on the current schedule of once every 4 weeks and have access to an epinephrine auto-injector set Continue Flonase nasal spray as needed for a stuffy nose. In the right nostril, point the applicator out toward the right ear. In the left nostril, point the applicator out toward the left ear Continue Xyzal once a day as needed for a runny nose.  Begin nasal saline rinses at least once a day Continue allergen avoidance measures directed toward mold as listed below  Reflux Continue Dexilant 60 mg as ordered to decrease reflux. This may help with wheezing Continue lifestyle modifications to decrease reflux  Otitis externa Begin cortisporin 4 drops in each ear four times a day for 7 days If no improvement in your symptoms after 7 days, follow up with ENT for further evaluation   Continue the other medications as listed in your chart.  Call me if this plan is not working well for you  Follow up in 6 or sooner if needed   Control of Mold Allergen Mold and fungi can grow on a variety of surfaces provided certain temperature and moisture conditions exist.  Outdoor molds grow on plants, decaying vegetation and soil.  The major outdoor mold, Alternaria and Cladosporium, are found in very high numbers during hot and dry conditions.  Generally, a late Summer - Fall peak is seen for common outdoor fungal spores.  Rain will temporarily lower outdoor mold spore count, but counts rise rapidly when the rainy period ends.  The most important  indoor molds are Aspergillus and Penicillium.  Dark, humid and poorly ventilated basements are ideal sites for mold growth.  The next most common sites of mold growth are the bathroom and the kitchen.  Outdoor [employer] 1. Use air conditioning and keep windows closed 2. Avoid exposure to decaying vegetation. 3. Avoid leaf raking. 4. Avoid grain handling. 5. Consider wearing a face mask if working in moldy areas.  Indoor Mold Control 1. Maintain humidity below 50%. 2. Clean washable surfaces with 5% bleach solution. 3. Remove sources e.g. Contaminated carpets.

## 2020-06-07 ENCOUNTER — Other Ambulatory Visit: Payer: Self-pay

## 2020-06-07 ENCOUNTER — Ambulatory Visit (INDEPENDENT_AMBULATORY_CARE_PROVIDER_SITE_OTHER): Payer: Medicare HMO | Admitting: *Deleted

## 2020-06-07 DIAGNOSIS — J4550 Severe persistent asthma, uncomplicated: Secondary | ICD-10-CM

## 2020-06-28 ENCOUNTER — Telehealth: Payer: Self-pay | Admitting: Family Medicine

## 2020-06-28 NOTE — Telephone Encounter (Signed)
They was calling about xhance not azelastine but they wanted to check on the pa from xhance I see where [employer] [facility] submitted one but it looks like in cover my meds it was never sent to plan. so I have done the pa and told [facility] to reach out to me tomorrow about status of pa at that time.

## 2020-06-28 NOTE — Telephone Encounter (Signed)
[facility] called  about a prio auth. On azelastine nasel spray. [phone]

## 2020-06-29 NOTE — Telephone Encounter (Signed)
Pa was approved until 02-10-2021

## 2020-07-04 ENCOUNTER — Ambulatory Visit (INDEPENDENT_AMBULATORY_CARE_PROVIDER_SITE_OTHER): Payer: Medicare HMO

## 2020-07-04 DIAGNOSIS — J309 Allergic rhinitis, unspecified: Secondary | ICD-10-CM | POA: Diagnosis not present

## 2020-07-14 ENCOUNTER — Ambulatory Visit (INDEPENDENT_AMBULATORY_CARE_PROVIDER_SITE_OTHER): Payer: Medicare HMO

## 2020-07-14 DIAGNOSIS — J309 Allergic rhinitis, unspecified: Secondary | ICD-10-CM

## 2020-07-27 ENCOUNTER — Ambulatory Visit (INDEPENDENT_AMBULATORY_CARE_PROVIDER_SITE_OTHER): Payer: Medicare HMO

## 2020-07-27 DIAGNOSIS — J309 Allergic rhinitis, unspecified: Secondary | ICD-10-CM

## 2020-07-31 IMAGING — CT CT CERVICAL SPINE W/ CM
3 of 4 series · 11 of 33 positions shown, 13 images · non-contrast
Comparison: none

CLINICAL DATA: Difficulty with balance, rule out myelopathy.
History of lumbar and cervical fusion
TECHNIQUE: Contiguous axial images were obtained through the Cervical,
Thoracic, and Lumbar spine after the intrathecal infusion of
infusion. Coronal and sagittal reconstructions were obtained of the
axial image sets.

[Series 5: c_spine 2.0 st · axial · 0.28mm/px · z∈[-234,-106]mm · 3 of 98 slices shown, 4 images]
[im 17/98  soft-tissue]
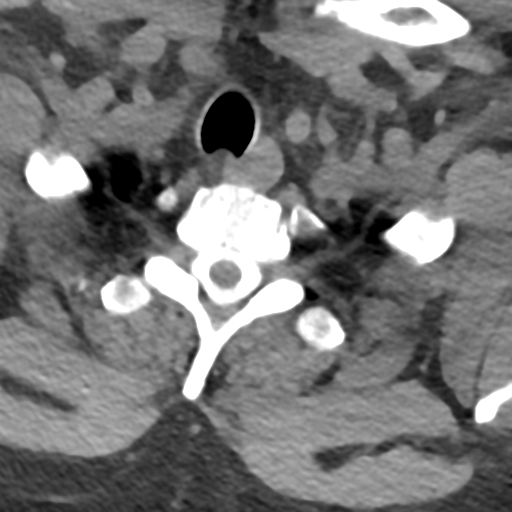
[im 17/98  bone]
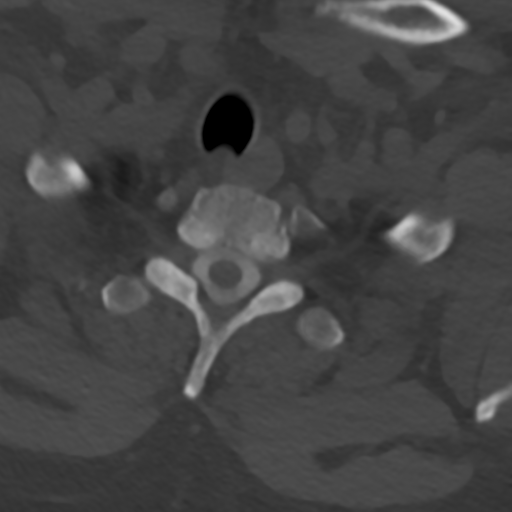
[im 49/98  bone]
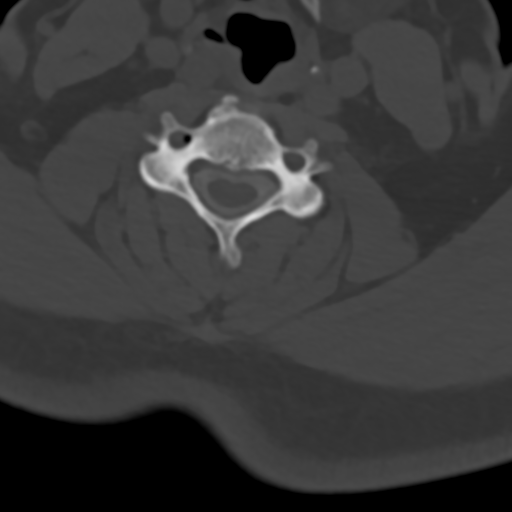
[im 81/98  bone]
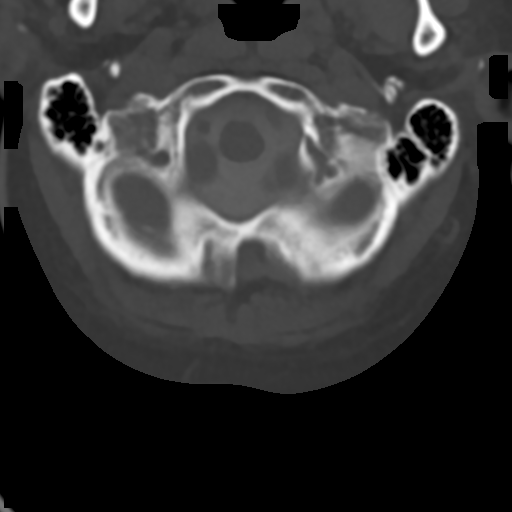

[Series 7: c_spine 2.0 sag bone · sagittal · 0.29mm/px · 5 of 61 slices shown, 6 images]
[im 21/61  bone]
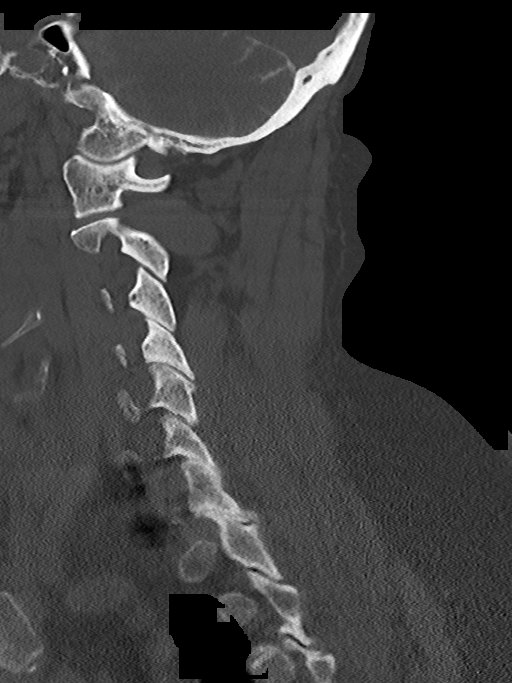
[im 26/61  bone]
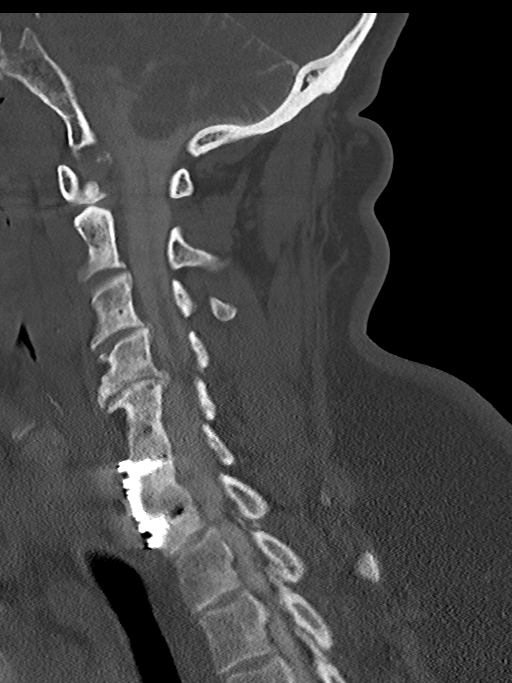
[im 31/61  soft-tissue]
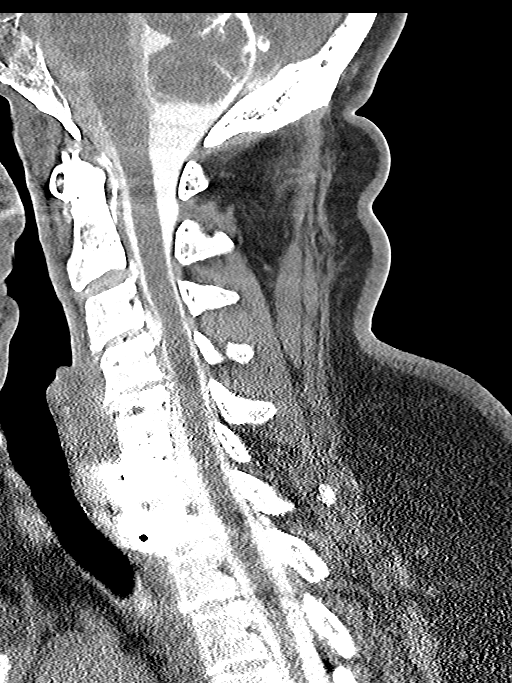
[im 31/61  bone]
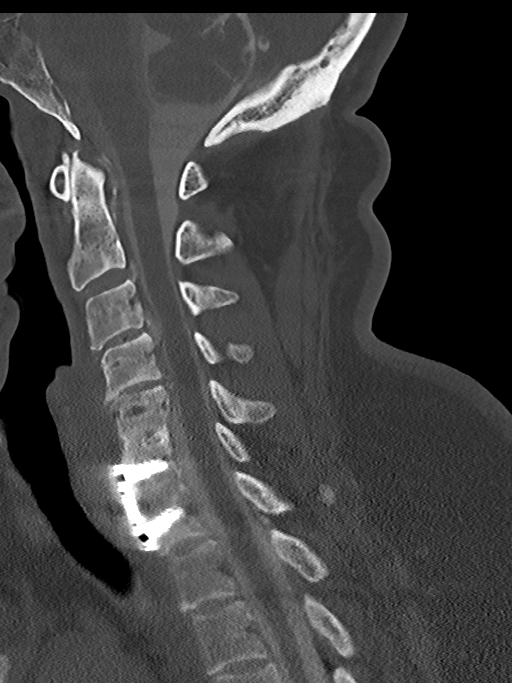
[im 36/61  bone]
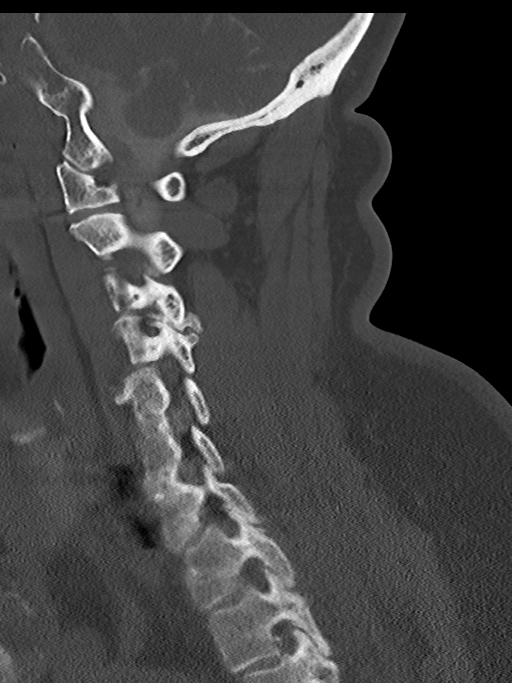
[im 41/61  bone]
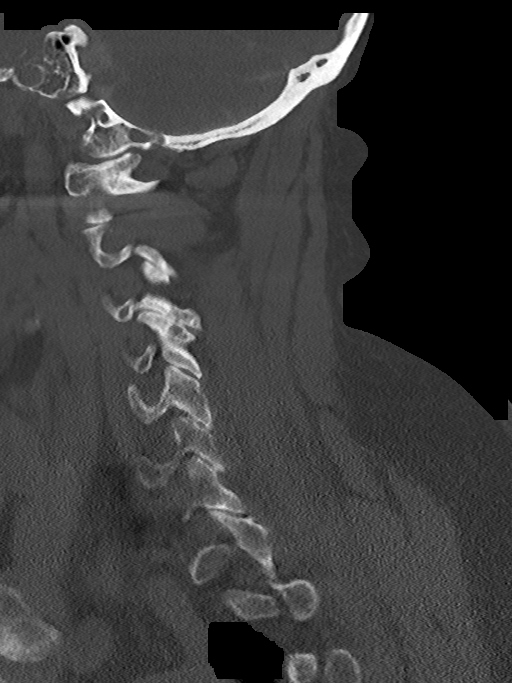

[Series 8: c_spine 2.0 cor bone · coronal · 0.26mm/px · 3 of 54 slices shown]
[im 11/54  bone]
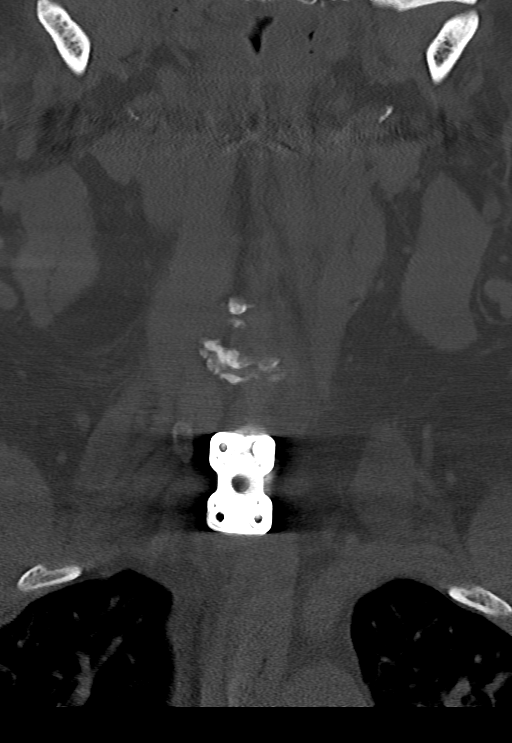
[im 22/54  bone]
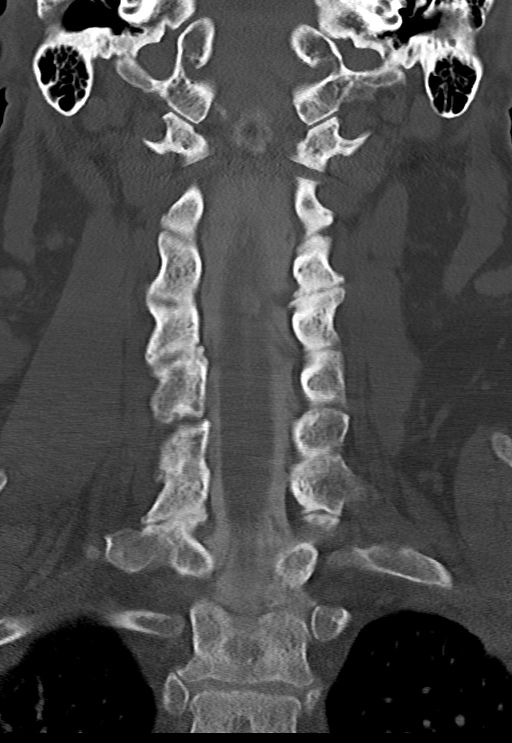
[im 32/54  bone]
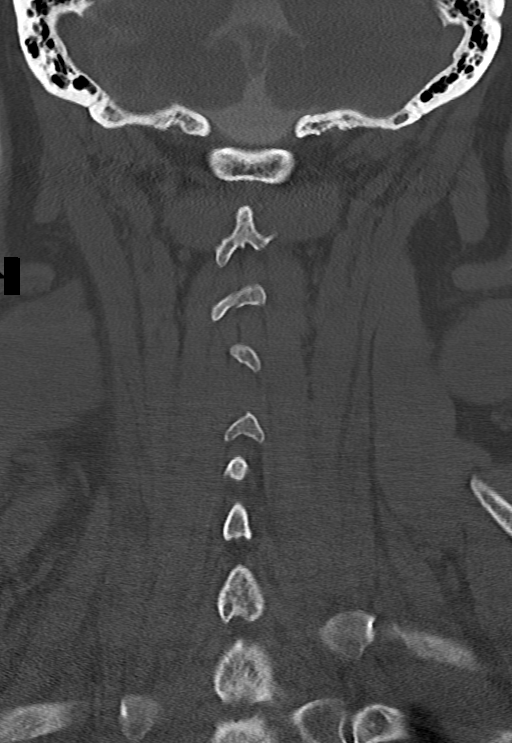

[11 of 33 positions shown; findings below may reference images not displayed]

FLUOROSCOPY TIME:  Reference procedure note

PROCEDURE:
LUMBAR PUNCTURE FOR CERVICAL LUMBAR AND THORACIC MYELOGRAM

CERVICAL AND LUMBAR AND THORACIC MYELOGRAM

CT CERVICAL MYELOGRAM

CT LUMBAR MYELOGRAM

CT THORACIC MYELOGRAM

Lumbar puncture and intrathecal contrast administration were
performed by Dr. Araus who will separately report for the portion
of the procedure. I personally supervised acquisition of the
myelogram images.
FINDINGS: CERVICAL AND LUMBAR MYELOGRAM FINDINGS:

Free flow of intrathecal contrast in the lumbar, thoracic, and
cervical regions. There was ventral smooth epidural defect at the
level of L4-5 and L5-S1 alif, suspect epidural lipomatosis. Mild
bulging or protrusion at L3-4. No pathologic motion. Recumbent
flexion-extension was performed given patient's difficulty with
balance.

No stenoses seen within the cervical or thoracic region. Limited
imaging was performed in the cervical and thoracic region for
radiation dose purposes given suboptimal visualization of dye due to
body habitus.

CT CERVICAL MYELOGRAM FINDINGS:

Alignment: Straightening of the cervical spine. Slight C7-T1
anterolisthesis

Vertebrae: No fracture, discitis, or aggressive bone lesion.

Cord: Normal shape and volume.

Paravertebral: Partial retropharyngeal course of the carotids. No
masslike or inflammatory finding.

Disc levels:

C1-2: Atlantodental joint narrowing and spurring. Mild calcification
without thickening of the transverse ligaments.

C2-3: Central disc protrusion with buttressing osteophyte not
deforming the cord. Minor facet spurring on the left. No impingement

C3-4: Left paracentral disc protrusion with calcification slightly
deforming the ventral cord. There is asymmetric left facet and
uncovertebral spurring with left foraminal impingement.

C4-5: Disc narrowing and bulging with endplate and uncovertebral
ridging. Right facet spurring. At least moderate right foraminal
narrowing with patent spinal canal

C5-6:  ACDF with solid arthrodesis and no impingement.

C6-7: ACDF with solid arthrodesis and no impingement. Ventral
hardware is in place.

C7-T1:Degenerative facet spurring asymmetric to the right with
slight anterolisthesis. Disc narrowing. The canal and foramina are
patent with mild narrowing on the right.

CT THORACIC MYELOGRAM FINDINGS:

Alignment: Mild exaggerated kyphosis.  No listhesis.

Vertebrae: No fracture, discitis, or aggressive bone lesion.

Cord: Normal volume and shape.

Extra-spinal: Coronary atherosclerosis.  Postoperative GE junction.

Disc levels: Generally preserved disc height with only trace
endplate spurring in the lower thoracic levels. Negative facets.

CT LUMBAR MYELOGRAM FINDINGS:

Lumbar spine MRI 03/08/2018 comparison.

Segmentation: 5 lumbar type vertebrae.

Alignment: Normal.

Vertebrae: Anterior lumbar fusion at L4-5 and L5-S1 with solid
arthrodesis. Spacers are normally located. No evidence of acute
fracture, aggressive bone lesion, or discitis.

Conus: The tip is at L1-2. normal morphology. No evident
arachnoiditis.

Extra-spinal: Intrinsic muscular atrophy more notable on the right
inferiorly.

Disc levels:

T12- L1: Unremarkable.

L1-L2: Unremarkable.

L2-L3: Unremarkable.

L3-L4: Degenerative facet spurring asymmetric to the left. Mild disc
narrowing and bulging. Mild spinal stenosis with patent foramina

L4-L5: Alif with solid arthrodesis. Degenerative facet spurring with
prior right-sided laminotomy. Mild right foraminal narrowing from
residual spurs.

L5-S1:Alif with solid arthrodesis. Mild facet spurring. Mild
epidural lipomatosis. No evidence of impingement
IMPRESSION: Cervical spine:

1. Disc degeneration with calcified protrusions at C2-3 to C4-5 with
up to mild spinal stenosis. Herniations at C3-4 and C4-5 both
contact the ventral cord.
2. Degenerative foraminal impingement on the left at C3-4 and right
at C4-5 where there is the greatest uncovertebral and facet
spurring.
3. C5-C7 ACDF with solid arthrodesis.
4. Normal appearance of the cord.

Thoracic spine:

Essentially negative.  Normal appearance of the cord.

Lumbar spine:

1. L4-5 and L5-S1 ALIF with solid arthrodesis.
2. L3-4 mild spinal stenosis from disc bulging and degenerative
facet hypertrophy.
3. Normal appearance of the conus.

## 2020-07-31 IMAGING — CT CT L SPINE W/ CM
3 series · 9 of 33 positions shown, 11 images · non-contrast
Comparison: none

CLINICAL DATA: Difficulty with balance, rule out myelopathy.
History of lumbar and cervical fusion
TECHNIQUE: Contiguous axial images were obtained through the Cervical,
Thoracic, and Lumbar spine after the intrathecal infusion of
infusion. Coronal and sagittal reconstructions were obtained of the
axial image sets.

[Series 1: l-spine 2.0 st · axial · 0.34mm/px · z∈[-571,-571]mm · 1 of 142 slices shown, 2 images]
[im 76/142  soft-tissue]
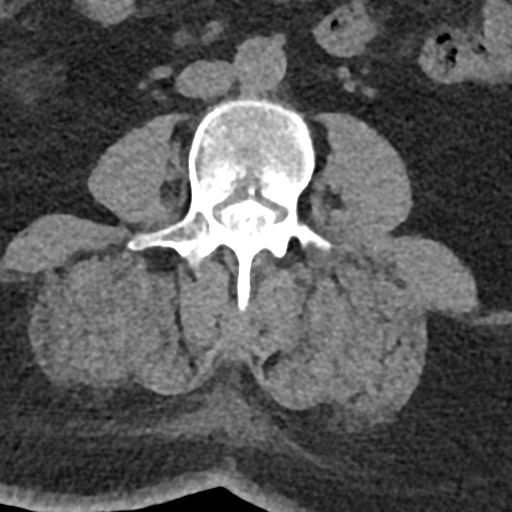
[im 76/142  bone]
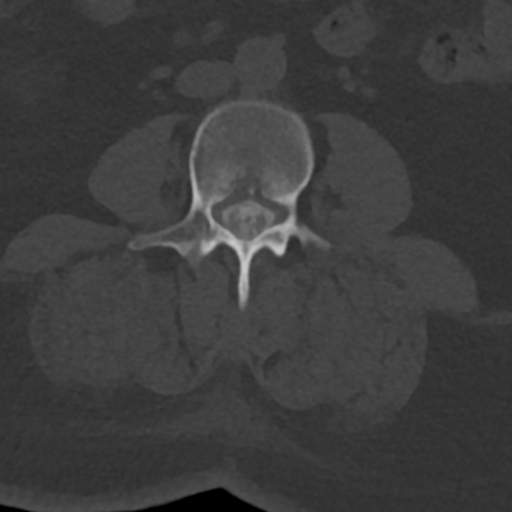

[Series 7: l-spine 2.0 cor bone · coronal · 0.30mm/px · 3 of 80 slices shown]
[im 16/80  bone]
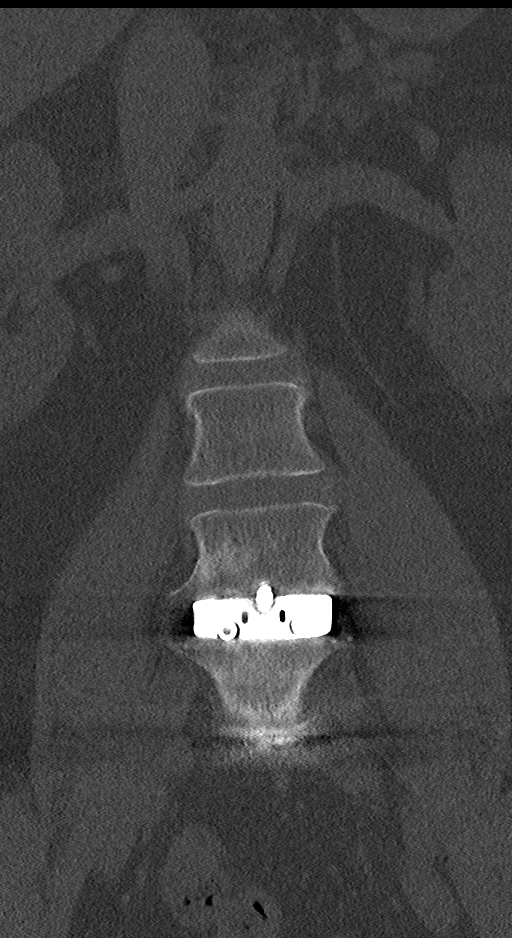
[im 32/80  bone]
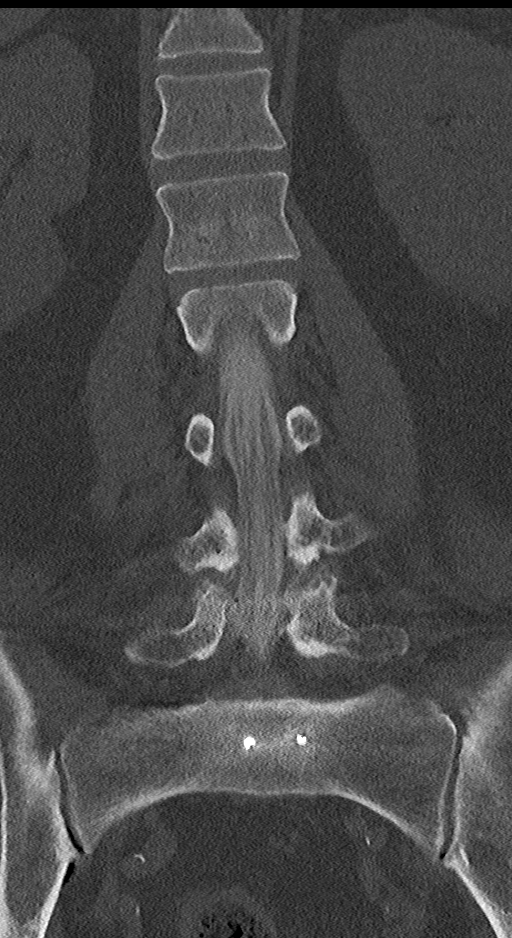
[im 48/80  bone]
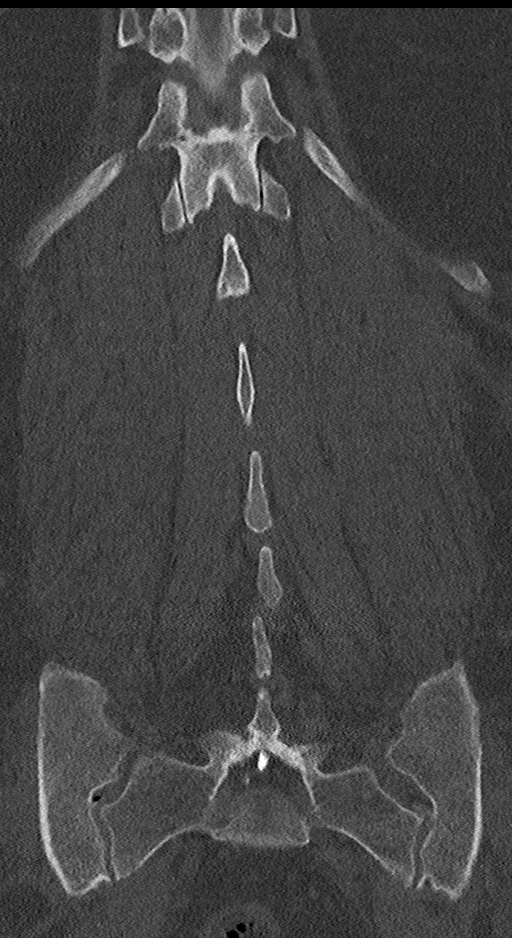

[Series 8: l-spine 2.0 sag bone · sagittal · 0.33mm/px · 5 of 69 slices shown, 6 images]
[im 23/69  bone]
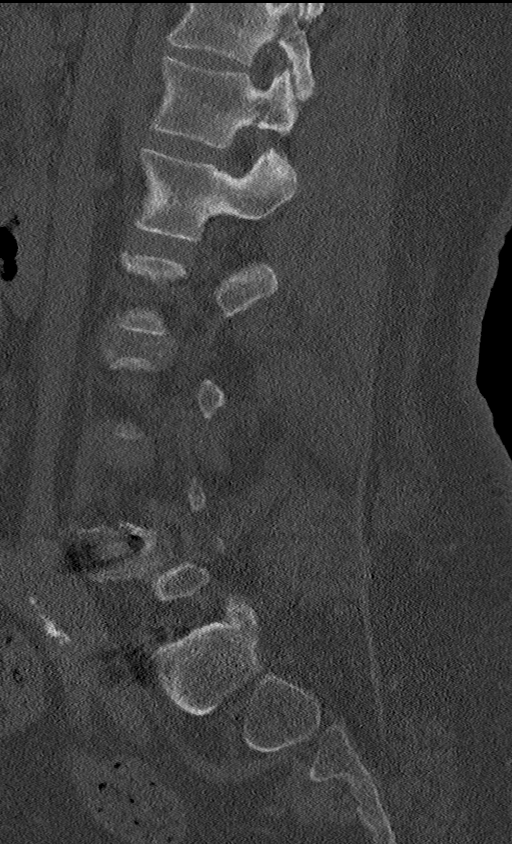
[im 29/69  bone]
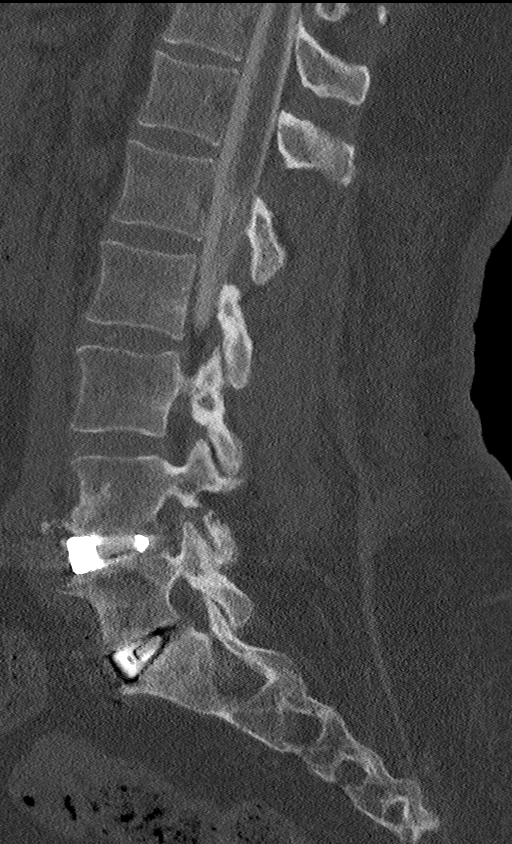
[im 35/69  soft-tissue]
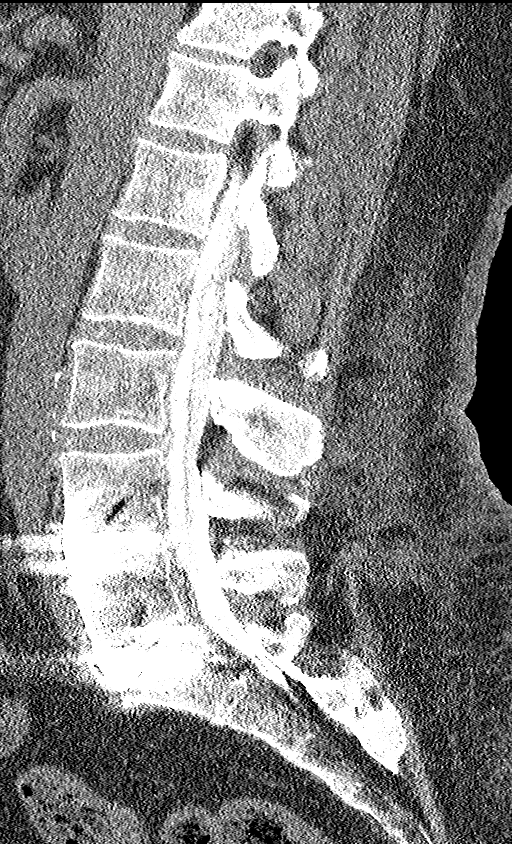
[im 35/69  bone]
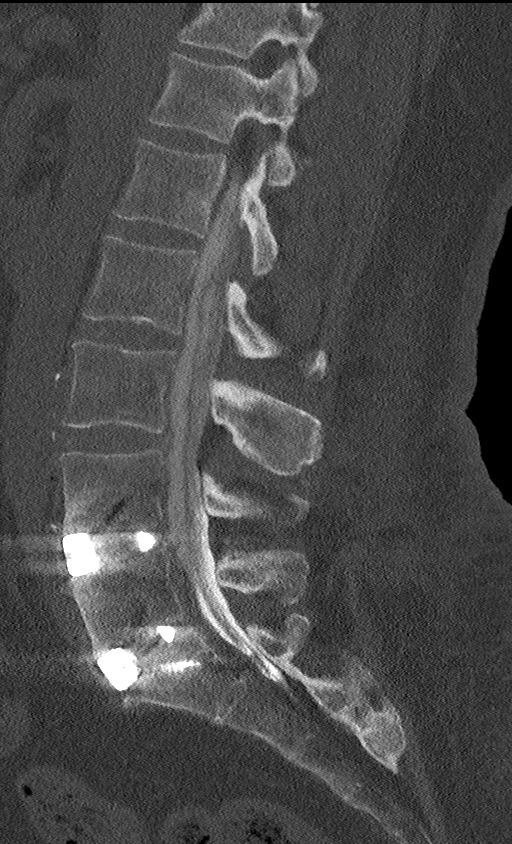
[im 40/69  bone]
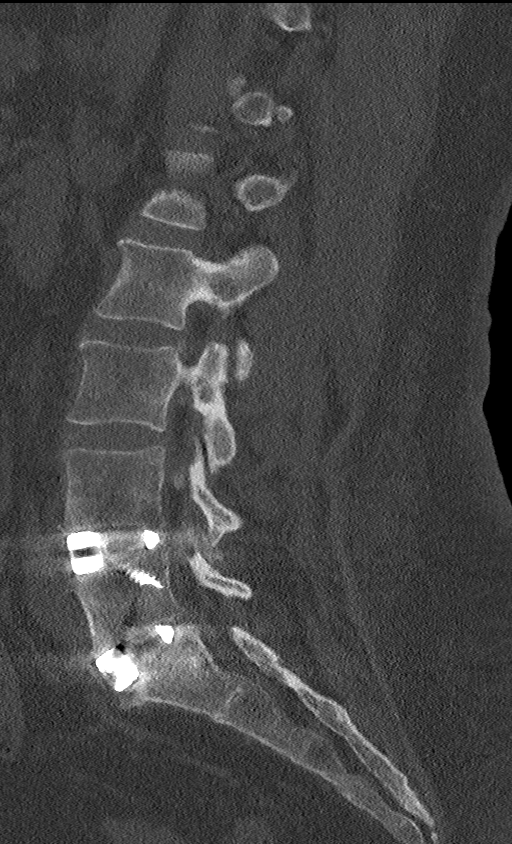
[im 46/69  bone]
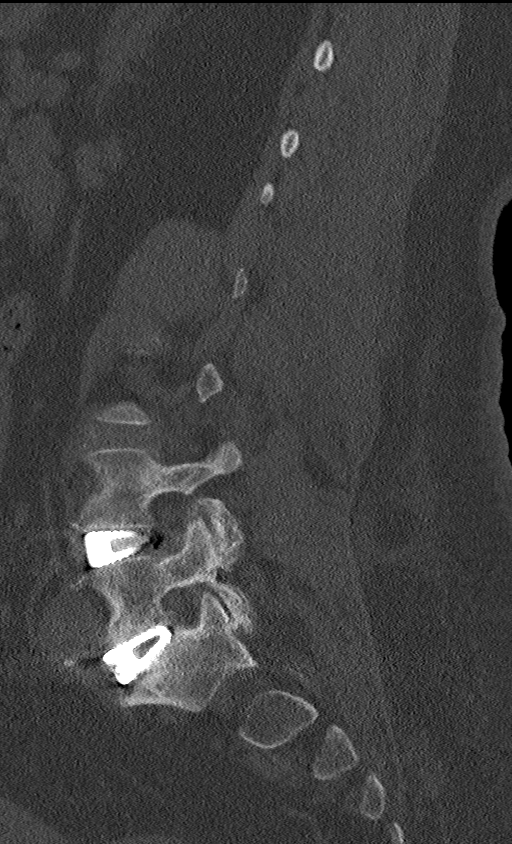

[9 of 33 positions shown; findings below may reference images not displayed]

FLUOROSCOPY TIME:  Reference procedure note

PROCEDURE:
LUMBAR PUNCTURE FOR CERVICAL LUMBAR AND THORACIC MYELOGRAM

CERVICAL AND LUMBAR AND THORACIC MYELOGRAM

CT CERVICAL MYELOGRAM

CT LUMBAR MYELOGRAM

CT THORACIC MYELOGRAM

Lumbar puncture and intrathecal contrast administration were
performed by Dr. Araus who will separately report for the portion
of the procedure. I personally supervised acquisition of the
myelogram images.
FINDINGS: CERVICAL AND LUMBAR MYELOGRAM FINDINGS:

Free flow of intrathecal contrast in the lumbar, thoracic, and
cervical regions. There was ventral smooth epidural defect at the
level of L4-5 and L5-S1 alif, suspect epidural lipomatosis. Mild
bulging or protrusion at L3-4. No pathologic motion. Recumbent
flexion-extension was performed given patient's difficulty with
balance.

No stenoses seen within the cervical or thoracic region. Limited
imaging was performed in the cervical and thoracic region for
radiation dose purposes given suboptimal visualization of dye due to
body habitus.

CT CERVICAL MYELOGRAM FINDINGS:

Alignment: Straightening of the cervical spine. Slight C7-T1
anterolisthesis

Vertebrae: No fracture, discitis, or aggressive bone lesion.

Cord: Normal shape and volume.

Paravertebral: Partial retropharyngeal course of the carotids. No
masslike or inflammatory finding.

Disc levels:

C1-2: Atlantodental joint narrowing and spurring. Mild calcification
without thickening of the transverse ligaments.

C2-3: Central disc protrusion with buttressing osteophyte not
deforming the cord. Minor facet spurring on the left. No impingement

C3-4: Left paracentral disc protrusion with calcification slightly
deforming the ventral cord. There is asymmetric left facet and
uncovertebral spurring with left foraminal impingement.

C4-5: Disc narrowing and bulging with endplate and uncovertebral
ridging. Right facet spurring. At least moderate right foraminal
narrowing with patent spinal canal

C5-6:  ACDF with solid arthrodesis and no impingement.

C6-7: ACDF with solid arthrodesis and no impingement. Ventral
hardware is in place.

C7-T1:Degenerative facet spurring asymmetric to the right with
slight anterolisthesis. Disc narrowing. The canal and foramina are
patent with mild narrowing on the right.

CT THORACIC MYELOGRAM FINDINGS:

Alignment: Mild exaggerated kyphosis.  No listhesis.

Vertebrae: No fracture, discitis, or aggressive bone lesion.

Cord: Normal volume and shape.

Extra-spinal: Coronary atherosclerosis.  Postoperative GE junction.

Disc levels: Generally preserved disc height with only trace
endplate spurring in the lower thoracic levels. Negative facets.

CT LUMBAR MYELOGRAM FINDINGS:

Lumbar spine MRI 03/08/2018 comparison.

Segmentation: 5 lumbar type vertebrae.

Alignment: Normal.

Vertebrae: Anterior lumbar fusion at L4-5 and L5-S1 with solid
arthrodesis. Spacers are normally located. No evidence of acute
fracture, aggressive bone lesion, or discitis.

Conus: The tip is at L1-2. normal morphology. No evident
arachnoiditis.

Extra-spinal: Intrinsic muscular atrophy more notable on the right
inferiorly.

Disc levels:

T12- L1: Unremarkable.

L1-L2: Unremarkable.

L2-L3: Unremarkable.

L3-L4: Degenerative facet spurring asymmetric to the left. Mild disc
narrowing and bulging. Mild spinal stenosis with patent foramina

L4-L5: Alif with solid arthrodesis. Degenerative facet spurring with
prior right-sided laminotomy. Mild right foraminal narrowing from
residual spurs.

L5-S1:Alif with solid arthrodesis. Mild facet spurring. Mild
epidural lipomatosis. No evidence of impingement
IMPRESSION: Cervical spine:

1. Disc degeneration with calcified protrusions at C2-3 to C4-5 with
up to mild spinal stenosis. Herniations at C3-4 and C4-5 both
contact the ventral cord.
2. Degenerative foraminal impingement on the left at C3-4 and right
at C4-5 where there is the greatest uncovertebral and facet
spurring.
3. C5-C7 ACDF with solid arthrodesis.
4. Normal appearance of the cord.

Thoracic spine:

Essentially negative.  Normal appearance of the cord.

Lumbar spine:

1. L4-5 and L5-S1 ALIF with solid arthrodesis.
2. L3-4 mild spinal stenosis from disc bulging and degenerative
facet hypertrophy.
3. Normal appearance of the conus.

## 2020-07-31 IMAGING — RF DG MYELOGRAM 2+ REGIONS
12 of 13 series · 12 of 13 positions shown · non-contrast
Comparison: none

CLINICAL DATA: Difficulty with balance, rule out myelopathy.
History of lumbar and cervical fusion
TECHNIQUE: Contiguous axial images were obtained through the Cervical,
Thoracic, and Lumbar spine after the intrathecal infusion of
infusion. Coronal and sagittal reconstructions were obtained of the
axial image sets.

[Series 1: cp_standard · 0.25mm/px · 1 of 1 slices shown (1 of 3)]
[im 1/1]
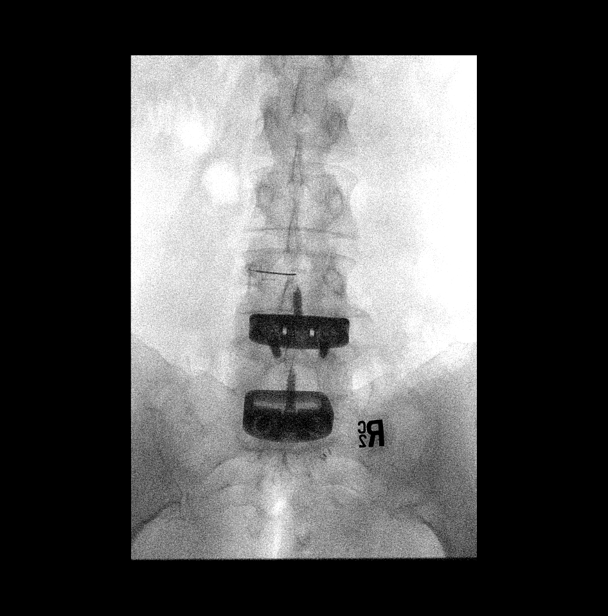

[Series 2: cp_standard · 0.25mm/px · 1 of 1 slices shown (2 of 3)]
[im 1/1]
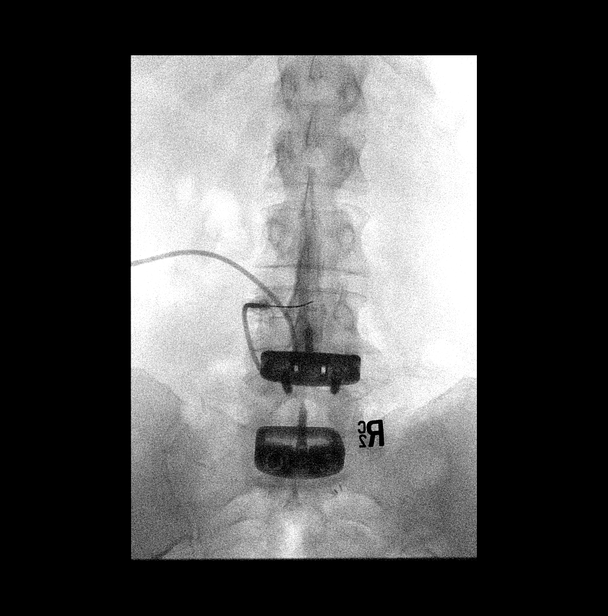

[Series 3: fluoro_myelogram_singleshot_bw · 0.17mm/px · 1 of 1 slices shown (1 of 9)]
[im 1/1]
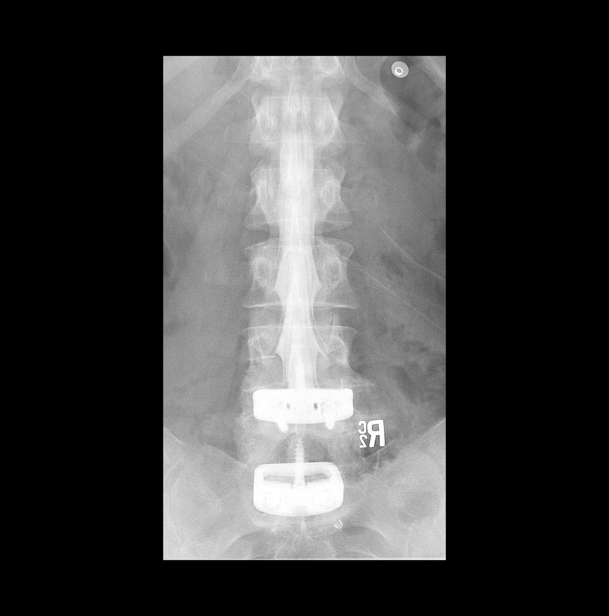

[Series 4: fluoro_myelogram_singleshot_bw · 0.17mm/px · 1 of 1 slices shown (2 of 9)]
[im 1/1]
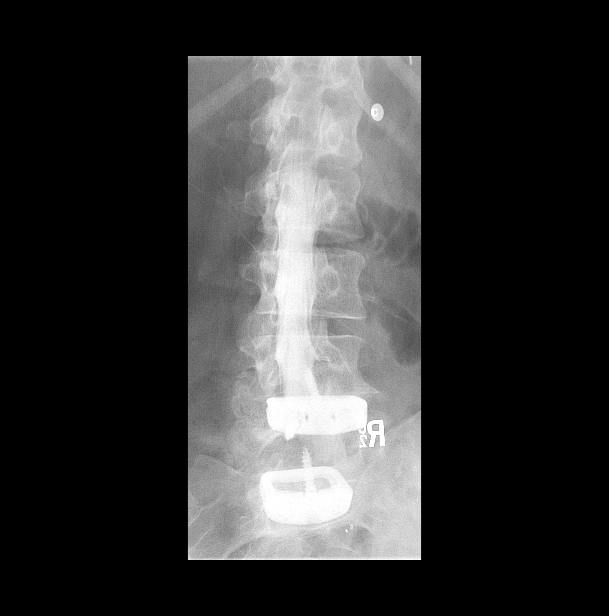

[Series 5: fluoro_myelogram_singleshot_bw · 0.17mm/px · 1 of 1 slices shown (3 of 9)]
[im 1/1]
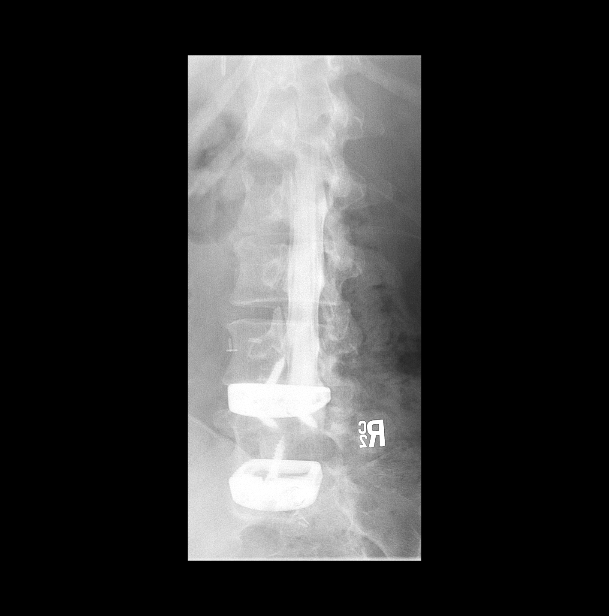

[Series 6: fluoro_myelogram_singleshot_bw · 0.17mm/px · 1 of 1 slices shown (4 of 9)]
[im 1/1]
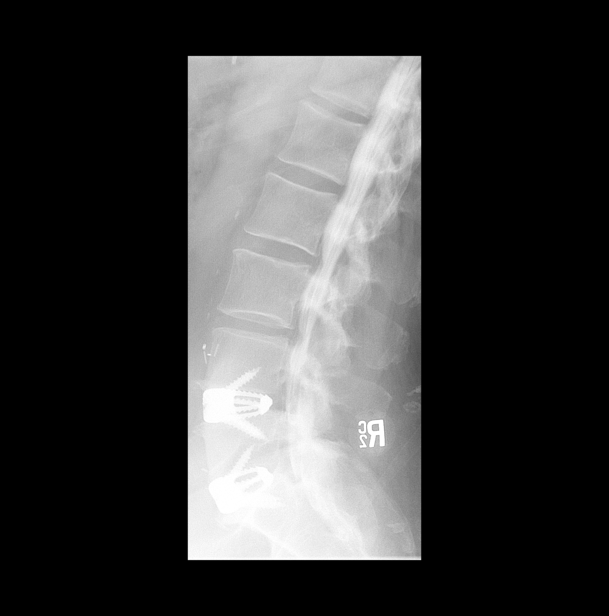

[Series 8: fluoro_myelogram_singleshot_bw · 0.17mm/px · 1 of 1 slices shown (5 of 9)]
[im 1/1]
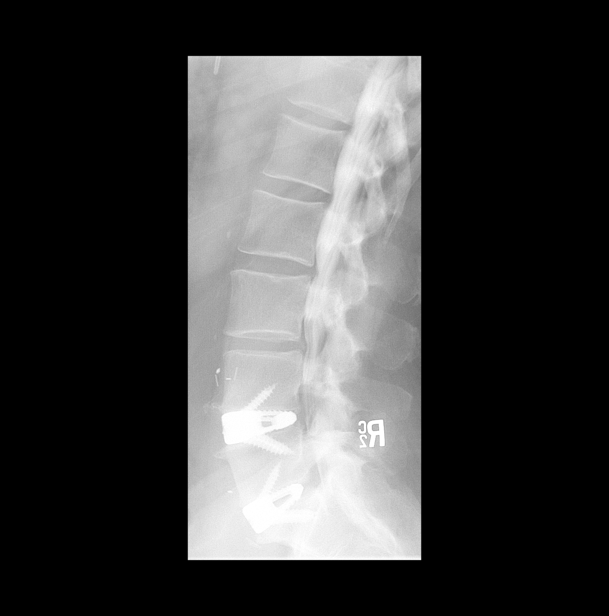

[Series 9: fluoro_myelogram_singleshot_bw · 0.17mm/px · 1 of 1 slices shown (6 of 9)]
[im 1/1]
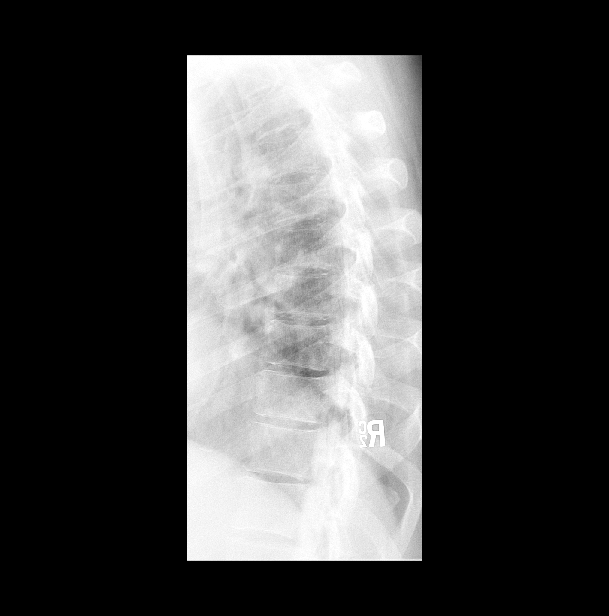

[Series 10: fluoro_myelogram_singleshot_bw · 0.17mm/px · 1 of 1 slices shown (7 of 9)]
[im 1/1]
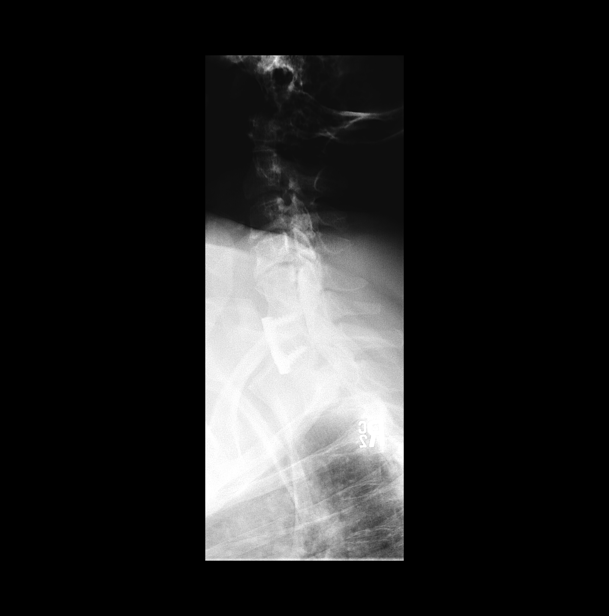

[Series 11: fluoro_myelogram_singleshot_bw · 0.17mm/px · 1 of 1 slices shown (8 of 9)]
[im 1/1]
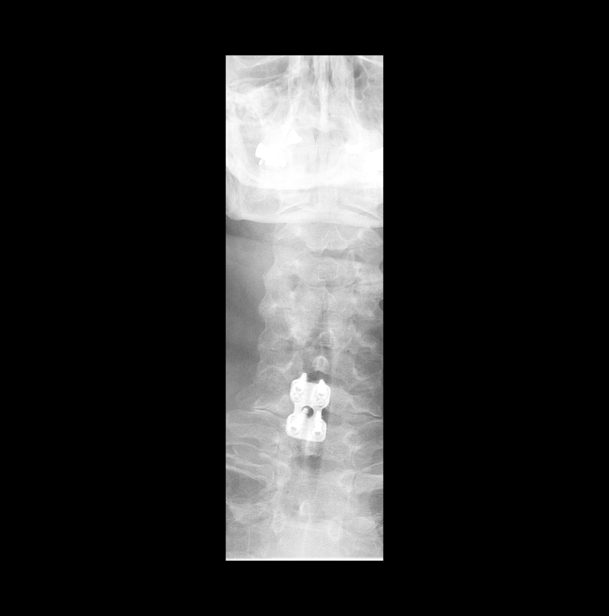

[Series 12: cp_standard · 0.25mm/px · 1 of 1 slices shown (3 of 3)]
[im 1/1]
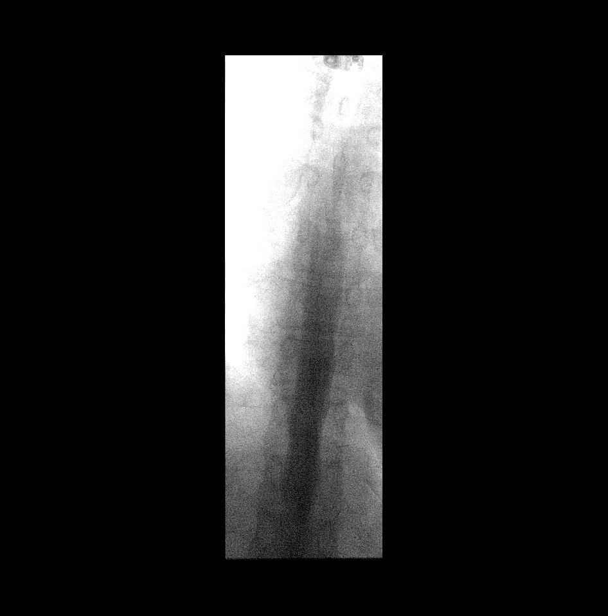

[Series 13: fluoro_myelogram_singleshot_bw · 0.17mm/px · 1 of 1 slices shown (9 of 9)]
[im 1/1]
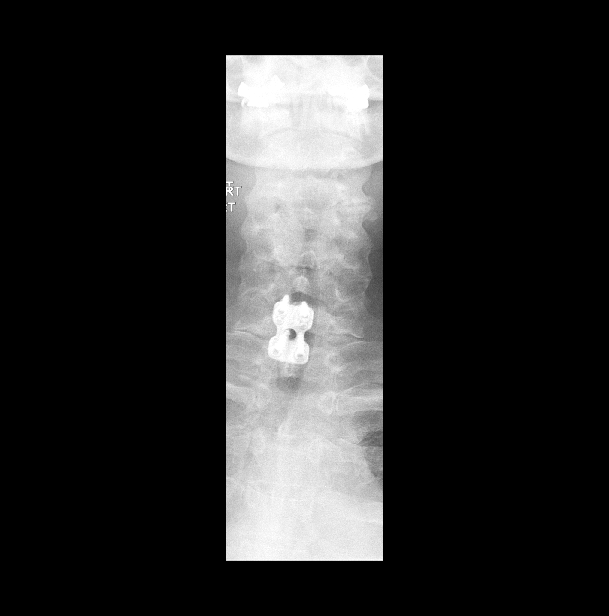

[12 of 13 positions shown; findings below may reference images not displayed]

FLUOROSCOPY TIME:  Reference procedure note

PROCEDURE:
LUMBAR PUNCTURE FOR CERVICAL LUMBAR AND THORACIC MYELOGRAM

CERVICAL AND LUMBAR AND THORACIC MYELOGRAM

CT CERVICAL MYELOGRAM

CT LUMBAR MYELOGRAM

CT THORACIC MYELOGRAM

Lumbar puncture and intrathecal contrast administration were
performed by Dr. Araus who will separately report for the portion
of the procedure. I personally supervised acquisition of the
myelogram images.
FINDINGS: CERVICAL AND LUMBAR MYELOGRAM FINDINGS:

Free flow of intrathecal contrast in the lumbar, thoracic, and
cervical regions. There was ventral smooth epidural defect at the
level of L4-5 and L5-S1 alif, suspect epidural lipomatosis. Mild
bulging or protrusion at L3-4. No pathologic motion. Recumbent
flexion-extension was performed given patient's difficulty with
balance.

No stenoses seen within the cervical or thoracic region. Limited
imaging was performed in the cervical and thoracic region for
radiation dose purposes given suboptimal visualization of dye due to
body habitus.

CT CERVICAL MYELOGRAM FINDINGS:

Alignment: Straightening of the cervical spine. Slight C7-T1
anterolisthesis

Vertebrae: No fracture, discitis, or aggressive bone lesion.

Cord: Normal shape and volume.

Paravertebral: Partial retropharyngeal course of the carotids. No
masslike or inflammatory finding.

Disc levels:

C1-2: Atlantodental joint narrowing and spurring. Mild calcification
without thickening of the transverse ligaments.

C2-3: Central disc protrusion with buttressing osteophyte not
deforming the cord. Minor facet spurring on the left. No impingement

C3-4: Left paracentral disc protrusion with calcification slightly
deforming the ventral cord. There is asymmetric left facet and
uncovertebral spurring with left foraminal impingement.

C4-5: Disc narrowing and bulging with endplate and uncovertebral
ridging. Right facet spurring. At least moderate right foraminal
narrowing with patent spinal canal

C5-6:  ACDF with solid arthrodesis and no impingement.

C6-7: ACDF with solid arthrodesis and no impingement. Ventral
hardware is in place.

C7-T1:Degenerative facet spurring asymmetric to the right with
slight anterolisthesis. Disc narrowing. The canal and foramina are
patent with mild narrowing on the right.

CT THORACIC MYELOGRAM FINDINGS:

Alignment: Mild exaggerated kyphosis.  No listhesis.

Vertebrae: No fracture, discitis, or aggressive bone lesion.

Cord: Normal volume and shape.

Extra-spinal: Coronary atherosclerosis.  Postoperative GE junction.

Disc levels: Generally preserved disc height with only trace
endplate spurring in the lower thoracic levels. Negative facets.

CT LUMBAR MYELOGRAM FINDINGS:

Lumbar spine MRI 03/08/2018 comparison.

Segmentation: 5 lumbar type vertebrae.

Alignment: Normal.

Vertebrae: Anterior lumbar fusion at L4-5 and L5-S1 with solid
arthrodesis. Spacers are normally located. No evidence of acute
fracture, aggressive bone lesion, or discitis.

Conus: The tip is at L1-2. normal morphology. No evident
arachnoiditis.

Extra-spinal: Intrinsic muscular atrophy more notable on the right
inferiorly.

Disc levels:

T12- L1: Unremarkable.

L1-L2: Unremarkable.

L2-L3: Unremarkable.

L3-L4: Degenerative facet spurring asymmetric to the left. Mild disc
narrowing and bulging. Mild spinal stenosis with patent foramina

L4-L5: Alif with solid arthrodesis. Degenerative facet spurring with
prior right-sided laminotomy. Mild right foraminal narrowing from
residual spurs.

L5-S1:Alif with solid arthrodesis. Mild facet spurring. Mild
epidural lipomatosis. No evidence of impingement
IMPRESSION: Cervical spine:

1. Disc degeneration with calcified protrusions at C2-3 to C4-5 with
up to mild spinal stenosis. Herniations at C3-4 and C4-5 both
contact the ventral cord.
2. Degenerative foraminal impingement on the left at C3-4 and right
at C4-5 where there is the greatest uncovertebral and facet
spurring.
3. C5-C7 ACDF with solid arthrodesis.
4. Normal appearance of the cord.

Thoracic spine:

Essentially negative.  Normal appearance of the cord.

Lumbar spine:

1. L4-5 and L5-S1 ALIF with solid arthrodesis.
2. L3-4 mild spinal stenosis from disc bulging and degenerative
facet hypertrophy.
3. Normal appearance of the conus.

## 2020-08-02 ENCOUNTER — Ambulatory Visit (INDEPENDENT_AMBULATORY_CARE_PROVIDER_SITE_OTHER): Payer: Medicare HMO

## 2020-08-02 ENCOUNTER — Other Ambulatory Visit: Payer: Self-pay

## 2020-08-02 DIAGNOSIS — J4550 Severe persistent asthma, uncomplicated: Secondary | ICD-10-CM | POA: Diagnosis not present

## 2020-08-16 ENCOUNTER — Ambulatory Visit (INDEPENDENT_AMBULATORY_CARE_PROVIDER_SITE_OTHER): Payer: Medicare HMO

## 2020-08-16 DIAGNOSIS — J309 Allergic rhinitis, unspecified: Secondary | ICD-10-CM | POA: Diagnosis not present

## 2020-08-23 LAB — COLOGUARD: COLOGUARD: NEGATIVE

## 2020-09-12 ENCOUNTER — Ambulatory Visit (INDEPENDENT_AMBULATORY_CARE_PROVIDER_SITE_OTHER): Payer: Medicare HMO

## 2020-09-12 DIAGNOSIS — J309 Allergic rhinitis, unspecified: Secondary | ICD-10-CM

## 2020-09-25 ENCOUNTER — Ambulatory Visit (INDEPENDENT_AMBULATORY_CARE_PROVIDER_SITE_OTHER): Payer: Medicare HMO

## 2020-09-25 DIAGNOSIS — J309 Allergic rhinitis, unspecified: Secondary | ICD-10-CM

## 2020-09-28 ENCOUNTER — Ambulatory Visit (INDEPENDENT_AMBULATORY_CARE_PROVIDER_SITE_OTHER): Payer: Medicare HMO

## 2020-09-28 ENCOUNTER — Other Ambulatory Visit: Payer: Self-pay

## 2020-09-28 DIAGNOSIS — J4550 Severe persistent asthma, uncomplicated: Secondary | ICD-10-CM | POA: Diagnosis not present

## 2020-10-07 ENCOUNTER — Other Ambulatory Visit: Payer: Self-pay | Admitting: Family Medicine

## 2020-10-12 ENCOUNTER — Ambulatory Visit (INDEPENDENT_AMBULATORY_CARE_PROVIDER_SITE_OTHER): Payer: Medicare HMO

## 2020-10-12 DIAGNOSIS — J309 Allergic rhinitis, unspecified: Secondary | ICD-10-CM | POA: Diagnosis not present

## 2020-10-24 ENCOUNTER — Ambulatory Visit (INDEPENDENT_AMBULATORY_CARE_PROVIDER_SITE_OTHER): Payer: Medicare HMO

## 2020-10-24 DIAGNOSIS — J309 Allergic rhinitis, unspecified: Secondary | ICD-10-CM

## 2020-10-31 ENCOUNTER — Other Ambulatory Visit: Payer: Self-pay | Admitting: Family Medicine

## 2020-11-23 ENCOUNTER — Ambulatory Visit: Payer: Medicare HMO

## 2020-11-24 ENCOUNTER — Ambulatory Visit (INDEPENDENT_AMBULATORY_CARE_PROVIDER_SITE_OTHER): Payer: Medicare HMO

## 2020-11-24 DIAGNOSIS — J309 Allergic rhinitis, unspecified: Secondary | ICD-10-CM | POA: Diagnosis not present

## 2020-11-29 ENCOUNTER — Other Ambulatory Visit: Payer: Self-pay

## 2020-11-29 ENCOUNTER — Ambulatory Visit (INDEPENDENT_AMBULATORY_CARE_PROVIDER_SITE_OTHER): Payer: Medicare HMO

## 2020-11-29 DIAGNOSIS — J4550 Severe persistent asthma, uncomplicated: Secondary | ICD-10-CM | POA: Diagnosis not present

## 2020-12-06 ENCOUNTER — Other Ambulatory Visit: Payer: Self-pay

## 2020-12-06 ENCOUNTER — Ambulatory Visit (INDEPENDENT_AMBULATORY_CARE_PROVIDER_SITE_OTHER): Payer: Medicare HMO | Admitting: Family Medicine

## 2020-12-06 ENCOUNTER — Encounter: Payer: Self-pay | Admitting: Family Medicine

## 2020-12-06 VITALS — BP 128/72 | HR 76 | Temp 98.0°F | Resp 16 | Ht 62.0 in | Wt 213.4 lb

## 2020-12-06 DIAGNOSIS — J4550 Severe persistent asthma, uncomplicated: Secondary | ICD-10-CM | POA: Diagnosis not present

## 2020-12-06 DIAGNOSIS — K219 Gastro-esophageal reflux disease without esophagitis: Secondary | ICD-10-CM

## 2020-12-06 DIAGNOSIS — H1013 Acute atopic conjunctivitis, bilateral: Secondary | ICD-10-CM | POA: Diagnosis not present

## 2020-12-06 DIAGNOSIS — J3089 Other allergic rhinitis: Secondary | ICD-10-CM | POA: Diagnosis not present

## 2020-12-06 DIAGNOSIS — H9391 Unspecified disorder of right ear: Secondary | ICD-10-CM | POA: Insufficient documentation

## 2020-12-06 MED ORDER — ALBUTEROL SULFATE HFA 108 (90 BASE) MCG/ACT IN AERS: 2.0000 | INHALATION_SPRAY | RESPIRATORY_TRACT | 1 refills | Status: DC | PRN

## 2020-12-06 NOTE — Patient Instructions (Addendum)
Asthma  Continue montelukast 10 mg once a day to prevent cough or wheeze Continue albuterol inhaler 2 puffs every 4 hours as needed or albuterol via nebulizer 1 unit dose once every 4 hours as needed for cough or wheeze Continue Fasenra 30 mg once every 8 weeks For asthma flares, add Symbicort 160-2 puffs twice a day with a spacer to prevent cough or wheeze and call the clinic  Allergic rhinitis Continue with your allergy injections on the current schedule of once every 4 weeks and have access to an epinephrine auto-injector set Continue Xyzal once a day as needed for a runny nose.  Continue Flonase nasal spray as needed for a stuffy nose. In the right nostril, point the applicator out toward the right ear. In the left nostril, point the applicator out toward the left ea Consider saline nasal rinses as needed for nasal symptoms. Use this before any medicated nasal sprays for best result Continue allergen avoidance measures directed toward mold as listed below  Reflux Continue lifestyle modifications to decrease reflux Follow-up with your gastrointestinal specialist for further evaluation of reflux and frequent vomiting  Scab on right ear Continue to clean the area twice a day and apply polysporin twice a day If no improvement in your symptoms after 7 days, follow up with ENT for further evaluation   Continue the other medications as listed in your chart.  Call me if this plan is not working well for you  Follow up in 6 or sooner if needed   Control of Mold Allergen Mold and fungi can grow on a variety of surfaces provided certain temperature and moisture conditions exist.  Outdoor molds grow on plants, decaying vegetation and soil.  The major outdoor mold, Alternaria and Cladosporium, are found in very high numbers during hot and dry conditions.  Generally, a late Summer - Fall peak is seen for common outdoor fungal spores.  Rain will temporarily lower outdoor mold spore count, but counts  rise rapidly when the rainy period ends.  The most important indoor molds are Aspergillus and Penicillium.  Dark, humid and poorly ventilated basements are ideal sites for mold growth.  The next most common sites of mold growth are the bathroom and the kitchen.  Outdoor [employer] Use air conditioning and keep windows closed Avoid exposure to decaying vegetation. Avoid leaf raking. Avoid grain handling. Consider wearing a face mask if working in moldy areas.  Indoor Mold Control Maintain humidity below 50%. Clean washable surfaces with 5% bleach solution. Remove sources e.g. Contaminated carpets.

## 2020-12-06 NOTE — Progress Notes (Signed)
[address] [place] [address] Dept: [phone]  FOLLOW UP NOTE  Patient ID: Carolyn Benson, female    DOB: [birth year]  Age: 65 y.o. MRN: [id number] Date of Office Visit: 12/06/2020  Assessment  Chief Complaint: Follow-up (Pt is present for 70mo f/u asthma & allergies, pt states all symptoms have been well controlled, no flare up since LOV.), Asthma, and Allergic Rhinitis   HPI Carolyn Benson is a 65 year old female who presents the clinic for follow-up visit.  She was last seen in this clinic on 06/05/2020 for evaluation of asthma, allergic rhinitis on allergen immunotherapy, reflux, and otitis externa.  At today's visit, she reports her asthma has been well controlled with no shortness of breath or cough activity or rest.  She does report 2 episodes of wheezing since her last visit which she associates with neighbors who were using a wood burning fireplace.  She continues montelukast 10 mg once a day and uses albuterol about 1 or 2 times a month with relief of symptoms.  She is not currently using Symbicort 160.  She continues Fasenra injections once every 8 weeks with no adverse reactions.  She reports a significant improvement in her symptoms of asthma while continuing on Fasenra.  Allergic rhinitis is reported as moderately well controlled with occasional nasal congestion as the main symptom.  She continues azelastine as needed and is not currently using an antihistamine, Flonase, or nasal saline rinses.  She continues allergen immunotherapy with no large or local reactions.  She reports a significant decrease in her symptoms of allergic rhinitis while continuing on allergen immunotherapy.  Allergic conjunctivitis is reported as well controlled with no medical intervention at this time.  Reflux is reported as poorly controlled with heartburn and vomiting occurring most days for about 1 month.  She continues to follow [facility] GI specialist at this time.  She reports a  scab on her right ear that began about 2 months ago.  She reports that she continues to pick this scab which results in frequent bleeding.  She had been using peroxide daily as well as Neosporin and has recently stopped using peroxide and continued Neosporin.  She denies any change in hearing.  Her current medications are listed in the chart.  Drug Allergies:  Allergies  Allergen Reactions   Ibuprofen Swelling   Ciprofloxacin Rash   Clarithromycin Nausea And Vomiting   Codeine Rash    Swelling   Doxycycline Nausea And Vomiting   Metoclopramide Rash    PT DOES NOT KNOW   Omnipaque [Iohexol] Other (See Comments)    Kidneys bleed Kidneys bleed   Sulfasalazine Rash   Phenylpropanol Nausea And Vomiting   Butorphanol Nausea Only   Butorphanol Tartrate Nausea And Vomiting and Nausea Only   Darvocet [Propoxyphene N-Acetaminophen] Nausea Only   Other Rash    Fluoroquinolones     Ranitidine Hcl Rash   Stadol [Butorphanol Tartrate] Nausea Only   Sulfa Antibiotics Rash    Physical Exam: BP 128/72   Pulse 76   Temp 98 F (36.7 C) (Temporal)   Resp 16   Ht 5\' 2"  (1.575 m)   Wt 213 lb 6.4 oz (96.8 kg)   SpO2 98%   BMI 39.03 kg/m    Physical Exam Vitals reviewed.  Constitutional:      Appearance: Normal appearance.  HENT:     Head: Normocephalic and atraumatic.     Right Ear: Tympanic membrane and ear canal normal.  Left Ear: Tympanic membrane, ear canal and external ear normal.     Nose:     Comments: Bilateral nares slightly erythematous with clear nasal drainage noted.  Pharynx normal.  Bilateral TMs normal.  Bilateral ear canals normal.  Small scab noted on the upper outer aspect of the right tragus.  No heat or infection noted.  Eyes normal.    Mouth/Throat:     Pharynx: Oropharynx is clear.  Eyes:     Conjunctiva/sclera: Conjunctivae normal.  Cardiovascular:     Rate and Rhythm: Normal rate and regular rhythm.     Heart sounds: Normal heart sounds. No murmur  heard. Pulmonary:     Effort: Pulmonary effort is normal.     Breath sounds: Normal breath sounds.     Comments: Lungs clear to auscultation Musculoskeletal:        General: Normal range of motion.     Cervical back: Normal range of motion and neck supple.  Skin:    General: Skin is warm.     Comments: Small scab noted on the right outer upper aspect of the tragus.  Neurological:     Mental Status: She is alert and oriented to person, place, and time.  Psychiatric:        Mood and Affect: Mood normal.        Behavior: Behavior normal.        Thought Content: Thought content normal.        Judgment: Judgment normal.    Diagnostics: FVC 1.63, FEV1 1.29.  Predicted FVC 2.79, predicted FEV1 2.19.  Spirometry indicates restriction.  This is consistent with previous spirometry readings.  Assessment and Plan: 1. Severe persistent asthma without complication   2. Perennial allergic rhinitis   3. Perennial allergic conjunctivitis of both eyes   4. Gastroesophageal reflux disease, unspecified whether esophagitis present   5. Lesion of right ear     Meds ordered this encounter  Medications   albuterol (VENTOLIN HFA) 108 (90 Base) MCG/ACT inhaler    Sig: Inhale 2 puffs into the lungs every 4 (four) hours as needed for wheezing or shortness of breath.    Dispense:  3 each    Refill:  1    Please dispense 90 days supply.     Patient Instructions  Asthma  Continue montelukast 10 mg once a day to prevent cough or wheeze Continue albuterol inhaler 2 puffs every 4 hours as needed or albuterol via nebulizer 1 unit dose once every 4 hours as needed for cough or wheeze Continue Fasenra 30 mg once every 8 weeks For asthma flares, add Symbicort 160-2 puffs twice a day with a spacer to prevent cough or wheeze and call the clinic  Allergic rhinitis Continue with your allergy injections on the current schedule of once every 4 weeks and have access to an epinephrine auto-injector set Continue  Xyzal once a day as needed for a runny nose.  Continue Flonase nasal spray as needed for a stuffy nose. In the right nostril, point the applicator out toward the right ear. In the left nostril, point the applicator out toward the left ea Consider saline nasal rinses as needed for nasal symptoms. Use this before any medicated nasal sprays for best result Continue allergen avoidance measures directed toward mold as listed below  Reflux Continue lifestyle modifications to decrease reflux Follow-up with your gastrointestinal specialist for further evaluation of reflux and frequent vomiting  Scab on right ear Continue to clean the area twice a day  and apply polysporin twice a day If no improvement in your symptoms after 7 days, follow up with ENT for further evaluation   Continue the other medications as listed in your chart.  Call me if this plan is not working well for you  Follow up in 6 or sooner if needed    Return in about 6 months (around 06/06/2021), or if symptoms worsen or fail to improve.    Thank you for the opportunity to care for this patient.  Please do not hesitate to contact me with questions.  Thermon Leyland, FNP [facility]

## 2020-12-18 ENCOUNTER — Ambulatory Visit (INDEPENDENT_AMBULATORY_CARE_PROVIDER_SITE_OTHER): Payer: Medicare HMO

## 2020-12-18 DIAGNOSIS — J309 Allergic rhinitis, unspecified: Secondary | ICD-10-CM

## 2021-01-01 ENCOUNTER — Ambulatory Visit (INDEPENDENT_AMBULATORY_CARE_PROVIDER_SITE_OTHER): Payer: Medicare HMO

## 2021-01-01 DIAGNOSIS — J309 Allergic rhinitis, unspecified: Secondary | ICD-10-CM | POA: Diagnosis not present

## 2021-01-15 ENCOUNTER — Ambulatory Visit (INDEPENDENT_AMBULATORY_CARE_PROVIDER_SITE_OTHER): Payer: Medicare HMO

## 2021-01-15 DIAGNOSIS — J309 Allergic rhinitis, unspecified: Secondary | ICD-10-CM | POA: Diagnosis not present

## 2021-01-24 ENCOUNTER — Other Ambulatory Visit: Payer: Self-pay

## 2021-01-24 ENCOUNTER — Ambulatory Visit (INDEPENDENT_AMBULATORY_CARE_PROVIDER_SITE_OTHER): Payer: Medicare HMO

## 2021-01-24 DIAGNOSIS — J4550 Severe persistent asthma, uncomplicated: Secondary | ICD-10-CM | POA: Diagnosis not present

## 2021-01-29 ENCOUNTER — Ambulatory Visit (INDEPENDENT_AMBULATORY_CARE_PROVIDER_SITE_OTHER): Payer: Medicare HMO

## 2021-01-29 DIAGNOSIS — J309 Allergic rhinitis, unspecified: Secondary | ICD-10-CM | POA: Diagnosis not present

## 2021-01-30 ENCOUNTER — Other Ambulatory Visit: Payer: Self-pay

## 2021-01-30 NOTE — Telephone Encounter (Signed)
error 

## 2021-01-31 ENCOUNTER — Other Ambulatory Visit: Payer: Self-pay

## 2021-02-14 ENCOUNTER — Telehealth: Payer: Self-pay | Admitting: *Deleted

## 2021-02-14 NOTE — Telephone Encounter (Signed)
L/m advising patient that [facility] app good till 02/10/22 and if she needs patient assistance she can reach out to clinic or go online at [facility] and bring app in for MD to sign for other meds

## 2021-02-16 ENCOUNTER — Ambulatory Visit (INDEPENDENT_AMBULATORY_CARE_PROVIDER_SITE_OTHER): Payer: Medicare HMO

## 2021-02-16 DIAGNOSIS — J309 Allergic rhinitis, unspecified: Secondary | ICD-10-CM

## 2021-03-13 ENCOUNTER — Other Ambulatory Visit: Payer: Self-pay | Admitting: *Deleted

## 2021-03-13 MED ORDER — FASENRA 30 MG/ML ~~LOC~~ SOSY: 30.0000 mg | PREFILLED_SYRINGE | SUBCUTANEOUS | 6 refills | Status: DC

## 2021-03-16 ENCOUNTER — Ambulatory Visit (INDEPENDENT_AMBULATORY_CARE_PROVIDER_SITE_OTHER): Payer: Medicare HMO

## 2021-03-16 DIAGNOSIS — J309 Allergic rhinitis, unspecified: Secondary | ICD-10-CM | POA: Diagnosis not present

## 2021-03-21 ENCOUNTER — Other Ambulatory Visit: Payer: Self-pay

## 2021-03-21 ENCOUNTER — Ambulatory Visit (INDEPENDENT_AMBULATORY_CARE_PROVIDER_SITE_OTHER): Payer: Medicare HMO

## 2021-03-21 DIAGNOSIS — J4550 Severe persistent asthma, uncomplicated: Secondary | ICD-10-CM | POA: Diagnosis not present

## 2021-04-09 ENCOUNTER — Ambulatory Visit (INDEPENDENT_AMBULATORY_CARE_PROVIDER_SITE_OTHER): Payer: Medicare HMO

## 2021-04-09 DIAGNOSIS — J309 Allergic rhinitis, unspecified: Secondary | ICD-10-CM | POA: Diagnosis not present

## 2021-04-18 ENCOUNTER — Ambulatory Visit: Payer: Medicare HMO

## 2021-04-23 DIAGNOSIS — J3089 Other allergic rhinitis: Secondary | ICD-10-CM | POA: Diagnosis not present

## 2021-04-23 NOTE — Progress Notes (Addendum)
VIALS EXP 04-12-22 ?

## 2021-05-04 ENCOUNTER — Other Ambulatory Visit: Payer: Self-pay | Admitting: Family Medicine

## 2021-05-07 ENCOUNTER — Ambulatory Visit (INDEPENDENT_AMBULATORY_CARE_PROVIDER_SITE_OTHER): Payer: Medicare HMO | Admitting: *Deleted

## 2021-05-07 DIAGNOSIS — J309 Allergic rhinitis, unspecified: Secondary | ICD-10-CM | POA: Diagnosis not present

## 2021-05-14 ENCOUNTER — Ambulatory Visit (INDEPENDENT_AMBULATORY_CARE_PROVIDER_SITE_OTHER): Payer: Medicare HMO

## 2021-05-14 ENCOUNTER — Other Ambulatory Visit: Payer: Self-pay

## 2021-05-14 DIAGNOSIS — J309 Allergic rhinitis, unspecified: Secondary | ICD-10-CM

## 2021-05-14 MED ORDER — ALBUTEROL SULFATE HFA 108 (90 BASE) MCG/ACT IN AERS: 2.0000 | INHALATION_SPRAY | RESPIRATORY_TRACT | 1 refills | Status: DC | PRN

## 2021-05-16 ENCOUNTER — Ambulatory Visit (INDEPENDENT_AMBULATORY_CARE_PROVIDER_SITE_OTHER): Payer: Medicare HMO

## 2021-05-16 DIAGNOSIS — J4550 Severe persistent asthma, uncomplicated: Secondary | ICD-10-CM

## 2021-05-19 ENCOUNTER — Other Ambulatory Visit: Payer: Self-pay | Admitting: Family Medicine

## 2021-05-21 ENCOUNTER — Ambulatory Visit (INDEPENDENT_AMBULATORY_CARE_PROVIDER_SITE_OTHER): Payer: Medicare HMO

## 2021-05-21 DIAGNOSIS — J309 Allergic rhinitis, unspecified: Secondary | ICD-10-CM | POA: Diagnosis not present

## 2021-05-28 ENCOUNTER — Ambulatory Visit: Payer: Self-pay

## 2021-05-28 ENCOUNTER — Ambulatory Visit: Payer: Medicare HMO | Admitting: Family Medicine

## 2021-05-28 ENCOUNTER — Encounter: Payer: Self-pay | Admitting: Family Medicine

## 2021-05-28 VITALS — BP 134/68 | HR 106 | Temp 99.3°F | Resp 20

## 2021-05-28 DIAGNOSIS — J4550 Severe persistent asthma, uncomplicated: Secondary | ICD-10-CM | POA: Diagnosis not present

## 2021-05-28 DIAGNOSIS — J309 Allergic rhinitis, unspecified: Secondary | ICD-10-CM

## 2021-05-28 DIAGNOSIS — K219 Gastro-esophageal reflux disease without esophagitis: Secondary | ICD-10-CM

## 2021-05-28 DIAGNOSIS — H1013 Acute atopic conjunctivitis, bilateral: Secondary | ICD-10-CM

## 2021-05-28 DIAGNOSIS — J3089 Other allergic rhinitis: Secondary | ICD-10-CM

## 2021-05-28 MED ORDER — FLUTICASONE PROPIONATE HFA 110 MCG/ACT IN AERO: 2.0000 | INHALATION_SPRAY | Freq: Two times a day (BID) | RESPIRATORY_TRACT | 5 refills | Status: DC

## 2021-05-28 MED ORDER — EPINEPHRINE 0.3 MG/0.3ML IJ SOAJ: INTRAMUSCULAR | 1 refills | Status: DC

## 2021-05-28 MED ORDER — ALBUTEROL SULFATE (2.5 MG/3ML) 0.083% IN NEBU: 2.5000 mg | INHALATION_SOLUTION | RESPIRATORY_TRACT | 1 refills | Status: DC | PRN

## 2021-05-28 NOTE — Progress Notes (Addendum)
? ?[address] ?[place] [address] ?Dept: [phone] ? ?FOLLOW UP NOTE ? ?Patient ID: Carolyn Benson, female    DOB: 1956/01/29  Age: 66 y.o. MRN: [id number] ?Date of Office Visit: 05/28/2021 ? ?Assessment  ?Chief Complaint: Asthma and Allergies ? ?HPI ?Carolyn Benson is a 66 year old female who presents to the clinic for follow-up visit.  She was last seen in this clinic on 12/06/2021 by Thermon Leyland, FNP, for evaluation of asthma, allergic rhinitis on allergen immunotherapy, scab on her ear, and reflux.  She began allergen immunotherapy directed toward mold prior to 2016.  At today's visit, she reports her asthma has been " so-so" over the last several weeks which she attributes to an increase in pollen exposure.  Asthma symptoms include moderate wheeze occurring in the daytime and nighttime as well as cough producing clear mucus.  She reports that she has been using her albuterol several times a day over the last 2 weeks.  She reports she does not have inhalers other than albuterol at this time.  She denies Symbicort use over the last 6 months.  She continues Fasenra injections with no large or local reactions.  She reports a significant decrease in her asthma symptoms while continuing on Fasenra.  Allergic rhinitis is reported as moderately well controlled with symptoms including nasal congestion, occasional sneeze, and postnasal drainage with frequent throat clearing.  She continues azelastine as needed and is not currently using a nasal saline rinse or Flonase.  She does report nosebleeds associated with Flonase use.  She has not experienced a recent nosebleed and is not currently using Flonase.  She continues allergen immunotherapy directed toward mold with no large or local reactions.  She reports a significant decrease in her symptoms of allergic rhinitis while continuing on allergen immunotherapy.  She continues to follow up with a GI specialist for reflux control.  Her current medications are  listed in the chart. ? ? ?Drug Allergies:  ?Allergies  ?Allergen Reactions  ? Ibuprofen Swelling  ? Ciprofloxacin Rash  ? Clarithromycin Nausea And Vomiting  ? Codeine Rash  ?  Swelling  ? Doxycycline Nausea And Vomiting  ? Metoclopramide Rash  ?  PT DOES NOT KNOW  ? Omnipaque [Iohexol] Other (See Comments)  ?  Kidneys bleed ?Kidneys bleed  ? Sulfasalazine Rash  ? Ozempic (0.25 Or 0.5 Mg-Dose) [Semaglutide(0.25 Or 0.5mg -Dos)]   ?  Acid reflux and vomiting   ? Phenergan [Promethazine] Diarrhea  ? Phenylpropanol Nausea And Vomiting  ? Pregabalin   ? Butorphanol Nausea Only  ? Butorphanol Tartrate Nausea And Vomiting and Nausea Only  ? Darvocet [Propoxyphene N-Acetaminophen] Nausea Only  ? Other Rash  ?  Fluoroquinolones  ?  ? Ranitidine Hcl Rash  ? Stadol [Butorphanol Tartrate] Nausea Only  ? Sulfa Antibiotics Rash  ? ? ?Physical Exam: ?BP 134/68   Pulse (!) 106   Temp 99.3 ?F (37.4 ?C) (Temporal)   Resp 20   SpO2 98%   ? ?Physical Exam ?Vitals reviewed.  ?Constitutional:   ?   Appearance: Normal appearance.  ?HENT:  ?   Head: Normocephalic and atraumatic.  ?   Right Ear: Tympanic membrane normal.  ?   Left Ear: Tympanic membrane normal.  ?   Nose:  ?   Comments: Bilateral nares slightly erythematous with clear nasal drainage noted.  Pharynx normal.  Ears normal.  Eyes normal. ?   Mouth/Throat:  ?   Pharynx: Oropharynx is clear.  ?Eyes:  ?  Conjunctiva/sclera: Conjunctivae normal.  ?Cardiovascular:  ?   Rate and Rhythm: Normal rate and regular rhythm.  ?   Heart sounds: Normal heart sounds. No murmur heard. ?Pulmonary:  ?   Effort: Pulmonary effort is normal.  ?   Breath sounds: Normal breath sounds.  ?   Comments: Lungs clear to auscultation ?Musculoskeletal:     ?   General: Normal range of motion.  ?   Cervical back: Normal range of motion and neck supple.  ?Skin: ?   General: Skin is warm and dry.  ?Neurological:  ?   Mental Status: She is alert and oriented to person, place, and time.  ?Psychiatric:     ?    Mood and Affect: Mood normal.     ?   Behavior: Behavior normal.     ?   Thought Content: Thought content normal.     ?   Judgment: Judgment normal.  ? ? ?Diagnostics: ?FVC 1.59, FEV1 1.25.  Predicted FVC 2.77, predicted FEV1 2.17.  Spirometry indicates possible restriction.  Postbronchodilator FVC 1.61, FEV1 1.30.  Postbronchodilator spirometry indicates possible restriction with no significant improvement in FEV1. ? ?Assessment and Plan: ?1. Not well controlled severe persistent asthma   ?2. Perennial allergic rhinitis   ?3. Perennial allergic conjunctivitis of both eyes   ?4. Gastroesophageal reflux disease, unspecified whether esophagitis present   ? ? ?Meds ordered this encounter  ?Medications  ? albuterol (PROVENTIL) (2.5 MG/3ML) 0.083% nebulizer solution  ?  Sig: Take 3 mLs (2.5 mg total) by nebulization every 4 (four) hours as needed for wheezing or shortness of breath.  ?  Dispense:  75 mL  ?  Refill:  1  ? fluticasone (FLOVENT HFA) 110 MCG/ACT inhaler  ?  Sig: Inhale 2 puffs into the lungs 2 (two) times daily.  ?  Dispense:  1 each  ?  Refill:  5  ? EPINEPHrine 0.3 mg/0.3 mL IJ SOAJ injection  ?  Sig: Use as directed for severe allergic reactions.  ?  Dispense:  2 each  ?  Refill:  1  ?  Please mylan brand. No adrenaclick pen  ? ? ?Patient Instructions  ?Asthma  ?Begin Flovent 110-2 puffs twice a day with a spacer to prevent cough or wheeze ?Continue montelukast 10 mg once a day to prevent cough or wheeze ?Continue albuterol inhaler 2 puffs every 4 hours as needed or albuterol via nebulizer 1 unit dose once every 4 hours as needed for cough or wheeze ?Continue Fasenra 30 mg once every 8 weeks ?For asthma flares, increase Flovent 110 to 2 puffs three times a day for 2 weeks or until cough and wheeze free ? ?Allergic rhinitis ?Continue with your allergy injections on the current schedule of once every 4 weeks and have access to an epinephrine auto-injector set ?Continue Xyzal once a day as needed for a runny  nose.  ?Continue azelastine 2 sprays in each nostril up to twice a day as needed for nasal symptoms ?Consider saline nasal rinses as needed for nasal symptoms. Use this before any medicated nasal sprays for best result ?Continue allergen avoidance measures directed toward mold as listed below ? ?Allergic conjunctivitis ?Continue olopatadine 1 drop in each eye once a day as needed for red or itchy eyes.  ? ?Reflux ?Continue lifestyle modifications to decrease reflux ?Follow-up with your gastrointestinal specialist for further evaluation of reflux and frequent vomiting ? ?Continue the other medications as listed in your chart. ? ?Call me if this plan  is not working well for you ? ?Follow up in 2 months or sooner if needed  ? ? ?Return in about 2 months (around 07/28/2021), or if symptoms worsen or fail to improve. ?  ? ?Thank you for the opportunity to care for this patient.  Please do not hesitate to contact me with questions. ? ?Thermon LeylandAnne Jeffrie Lofstrom, FNP ?[facility] ? ? ? ? ? ?

## 2021-05-28 NOTE — Patient Instructions (Addendum)
Asthma  ?Begin Flovent 110-2 puffs twice a day with a spacer to prevent cough or wheeze ?Continue montelukast 10 mg once a day to prevent cough or wheeze ?Continue albuterol inhaler 2 puffs every 4 hours as needed or albuterol via nebulizer 1 unit dose once every 4 hours as needed for cough or wheeze ?Continue Fasenra 30 mg once every 8 weeks ?For asthma flares, increase Flovent 110 to 2 puffs three times a day for 2 weeks or until cough and wheeze free ? ?Allergic rhinitis ?Continue with your allergy injections on the current schedule of once every 4 weeks and have access to an epinephrine auto-injector set ?Continue Xyzal once a day as needed for a runny nose.  ?Continue azelastine 2 sprays in each nostril up to twice a day as needed for nasal symptoms ?Consider saline nasal rinses as needed for nasal symptoms. Use this before any medicated nasal sprays for best result ?Continue allergen avoidance measures directed toward mold as listed below ? ?Allergic conjunctivitis ?Continue olopatadine 1 drop in each eye once a day as needed for red or itchy eyes.  ? ?Reflux ?Continue lifestyle modifications to decrease reflux ?Follow-up with your gastrointestinal specialist for further evaluation of reflux and frequent vomiting ? ?Continue the other medications as listed in your chart. ? ?Call me if this plan is not working well for you ? ?Follow up in 2 months or sooner if needed  ? ?Control of Mold Allergen ?Mold and fungi can grow on a variety of surfaces provided certain temperature and moisture conditions exist.  Outdoor molds grow on plants, decaying vegetation and soil.  The major outdoor mold, Alternaria and Cladosporium, are found in very high numbers during hot and dry conditions.  Generally, a late Summer - Fall peak is seen for common outdoor fungal spores.  Rain will temporarily lower outdoor mold spore count, but counts rise rapidly when the rainy period ends.  The most important indoor molds are Aspergillus  and Penicillium.  Dark, humid and poorly ventilated basements are ideal sites for mold growth.  The next most common sites of mold growth are the bathroom and the kitchen. ? ?Outdoor [employer] ?Use air conditioning and keep windows closed ?Avoid exposure to decaying vegetation. ?Avoid leaf raking. ?Avoid grain handling. ?Consider wearing a face mask if working in moldy areas. ? ?Indoor Mold Control ?Maintain humidity below 50%. ?Clean washable surfaces with 5% bleach solution. ?Remove sources e.g. Contaminated carpets. ? ?

## 2021-05-29 ENCOUNTER — Other Ambulatory Visit: Payer: Self-pay | Admitting: *Deleted

## 2021-05-29 MED ORDER — ALBUTEROL SULFATE (2.5 MG/3ML) 0.083% IN NEBU: 2.5000 mg | INHALATION_SOLUTION | RESPIRATORY_TRACT | 1 refills | Status: AC | PRN

## 2021-05-29 MED ORDER — FLUTICASONE PROPIONATE HFA 110 MCG/ACT IN AERO: 2.0000 | INHALATION_SPRAY | Freq: Two times a day (BID) | RESPIRATORY_TRACT | 5 refills | Status: DC

## 2021-05-29 NOTE — Telephone Encounter (Signed)
Pt called and stated her pharmacy was on back order for flovent and albuterol solution. [facility]. She let me know if there are any further issues.  ?

## 2021-06-04 ENCOUNTER — Ambulatory Visit (INDEPENDENT_AMBULATORY_CARE_PROVIDER_SITE_OTHER): Payer: Medicare HMO

## 2021-06-04 DIAGNOSIS — J309 Allergic rhinitis, unspecified: Secondary | ICD-10-CM

## 2021-07-02 ENCOUNTER — Ambulatory Visit (INDEPENDENT_AMBULATORY_CARE_PROVIDER_SITE_OTHER): Payer: Medicare HMO

## 2021-07-02 DIAGNOSIS — J309 Allergic rhinitis, unspecified: Secondary | ICD-10-CM | POA: Diagnosis not present

## 2021-07-11 ENCOUNTER — Ambulatory Visit (INDEPENDENT_AMBULATORY_CARE_PROVIDER_SITE_OTHER): Payer: Medicare HMO

## 2021-07-11 DIAGNOSIS — J4550 Severe persistent asthma, uncomplicated: Secondary | ICD-10-CM

## 2021-07-30 ENCOUNTER — Ambulatory Visit (INDEPENDENT_AMBULATORY_CARE_PROVIDER_SITE_OTHER): Payer: Medicare HMO

## 2021-07-30 DIAGNOSIS — J309 Allergic rhinitis, unspecified: Secondary | ICD-10-CM | POA: Diagnosis not present

## 2021-08-15 ENCOUNTER — Ambulatory Visit: Payer: Medicare HMO | Admitting: Family Medicine

## 2021-08-15 ENCOUNTER — Encounter: Payer: Self-pay | Admitting: Family Medicine

## 2021-08-15 VITALS — BP 106/60 | HR 82 | Temp 98.1°F | Resp 20 | Ht 63.0 in | Wt 230.8 lb

## 2021-08-15 DIAGNOSIS — H1013 Acute atopic conjunctivitis, bilateral: Secondary | ICD-10-CM | POA: Diagnosis not present

## 2021-08-15 DIAGNOSIS — J3089 Other allergic rhinitis: Secondary | ICD-10-CM | POA: Diagnosis not present

## 2021-08-15 DIAGNOSIS — K219 Gastro-esophageal reflux disease without esophagitis: Secondary | ICD-10-CM | POA: Diagnosis not present

## 2021-08-15 DIAGNOSIS — J4550 Severe persistent asthma, uncomplicated: Secondary | ICD-10-CM | POA: Diagnosis not present

## 2021-08-15 NOTE — Progress Notes (Signed)
[address] [place] [address] Dept: [phone]  FOLLOW UP NOTE  Patient ID: Carolyn Benson, female    DOB: April 20, 1955  Age: 66 y.o. MRN: [id number] Date of Office Visit: 08/15/2021  Assessment  Chief Complaint: Asthma  HPI Carolyn Benson is a 66 year old female who presents to the clinic for follow-up visit.  She was last seen in this clinic on 05/28/2021 by Thermon Leyland, FNP, for evaluation of asthma, allergic rhinitis, reflux, and allergic conjunctivitis.  At today's visit, she reports her asthma has been well controlled other than dry cough occurring a few weeks ago during the poor air quality days caused by the [employer].  She continues Montelukast 10 mg once a day and uses Flovent 110 about once a month and albuterol 1-3 times a month.  She continues to receive Fasenra injections once every 8 weeks with no larger local reactions.  She reports a significant decrease in her symptoms of asthma while continuing on Fasenra injections.  Allergic rhinitis is reported as well controlled with no symptoms including rhinorrhea, nasal congestion, sneezing, or postnasal drainage.  She continues Xyzal 5 mg once a day and is not currently using a nasal steroid spray or nasal saline spray.  She continues allergen immunotherapy directed toward molds with no large or local reactions.  She reports a significant decrease in her symptoms of allergic rhinitis while continuing on allergen immunotherapy.  She reports allergic conjunctivitis as well controlled with no intervention at this time.  Reflux is reported as well controlled with no symptoms including heartburn or vomiting.  Her current medications are listed in the chart.  Drug Allergies:  Allergies  Allergen Reactions   Ibuprofen Swelling   Ciprofloxacin Rash   Clarithromycin Nausea And Vomiting   Codeine Rash    Swelling   Doxycycline Nausea And Vomiting   Metoclopramide Rash    PT DOES NOT KNOW   Omnipaque [Iohexol] Other  (See Comments)    Kidneys bleed Kidneys bleed   Sulfasalazine Rash   Ozempic (0.25 Or 0.5 Mg-Dose) [Semaglutide(0.25 Or 0.5mg -Dos)]     Acid reflux and vomiting    Phenergan [Promethazine] Diarrhea   Phenylpropanol Nausea And Vomiting   Pregabalin    Butorphanol Nausea Only   Butorphanol Tartrate Nausea And Vomiting and Nausea Only   Darvocet [Propoxyphene N-Acetaminophen] Nausea Only   Other Rash    Fluoroquinolones     Ranitidine Hcl Rash   Stadol [Butorphanol Tartrate] Nausea Only   Sulfa Antibiotics Rash    Physical Exam: BP 106/60 (BP Location: Right Arm, Patient Position: Sitting, Cuff Size: Large)   Pulse 82   Temp 98.1 F (36.7 C) (Temporal)   Resp 20   Ht 5\' 3"  (1.6 m)   Wt 230 lb 12.8 oz (104.7 kg)   SpO2 98%   BMI 40.88 kg/m    Physical Exam Vitals reviewed.  Constitutional:      Appearance: Normal appearance.  HENT:     Head: Normocephalic and atraumatic.     Right Ear: Tympanic membrane normal.     Left Ear: Tympanic membrane normal.     Nose:     Comments: Bilateral nares slightly erythematous with clear nasal drainage noted.  Pharynx normal.  Ears normal.  Eyes normal.    Mouth/Throat:     Pharynx: Oropharynx is clear.  Eyes:     Conjunctiva/sclera: Conjunctivae normal.  Cardiovascular:     Rate and Rhythm: Normal rate and regular rhythm.  Heart sounds: Normal heart sounds. No murmur heard. Pulmonary:     Effort: Pulmonary effort is normal.     Breath sounds: Normal breath sounds.     Comments: Lungs clear to auscultation Musculoskeletal:        General: Normal range of motion.     Cervical back: Normal range of motion and neck supple.  Skin:    General: Skin is warm and dry.  Neurological:     Mental Status: She is alert and oriented to person, place, and time.  Psychiatric:        Mood and Affect: Mood normal.        Behavior: Behavior normal.        Thought Content: Thought content normal.        Judgment: Judgment normal.      Diagnostics: FVC 1.30, FEV1 1.06.  Predicted FVC 2.87, predicted FEV1 2.24.  Spirometry indicates possible severe restriction.  Assessment and Plan: 1. Severe persistent asthma without complication   2. Perennial allergic rhinitis   3. Perennial allergic conjunctivitis of both eyes   4. Gastroesophageal reflux disease, unspecified whether esophagitis present     Patient Instructions  Asthma  Continue montelukast 10 mg once a day to prevent cough or wheeze Continue albuterol inhaler 2 puffs every 4 hours as needed or albuterol via nebulizer 1 unit dose once every 4 hours as needed for cough or wheeze Continue Fasenra 30 mg once every 8 weeks For asthma flares, begin Flovent 110 to 2 puffs three times a day for 2 weeks or until cough and wheeze free  Allergic rhinitis Continue with your allergy injections on the current schedule of once every 4 weeks and have access to an epinephrine auto-injector set Continue Xyzal once a day as needed for a runny nose.  Continue azelastine 2 sprays in each nostril up to twice a day as needed for nasal symptoms Consider saline nasal rinses as needed for nasal symptoms. Use this before any medicated nasal sprays for best result Continue allergen avoidance measures directed toward mold as listed below  Allergic conjunctivitis Continue olopatadine 1 drop in each eye once a day as needed for red or itchy eyes.   Reflux Continue lifestyle modifications to decrease reflux  Continue the other medications as listed in your chart.  Call me if this plan is not working well for you  Follow up in 6 months or sooner if needed    Return in about 6 months (around 02/15/2022), or if symptoms worsen or fail to improve.    Thank you for the opportunity to care for this patient.  Please do not hesitate to contact me with questions.  Thermon Leyland, FNP [facility]

## 2021-08-15 NOTE — Patient Instructions (Addendum)
Asthma  Continue montelukast 10 mg once a day to prevent cough or wheeze Continue albuterol inhaler 2 puffs every 4 hours as needed or albuterol via nebulizer 1 unit dose once every 4 hours as needed for cough or wheeze Continue Fasenra 30 mg once every 8 weeks For asthma flares, begin Flovent 110 to 2 puffs three times a day for 2 weeks or until cough and wheeze free  Allergic rhinitis Continue with your allergy injections on the current schedule of once every 4 weeks and have access to an epinephrine auto-injector set Continue Xyzal once a day as needed for a runny nose.  Continue azelastine 2 sprays in each nostril up to twice a day as needed for nasal symptoms Consider saline nasal rinses as needed for nasal symptoms. Use this before any medicated nasal sprays for best result Continue allergen avoidance measures directed toward mold as listed below  Allergic conjunctivitis Continue olopatadine 1 drop in each eye once a day as needed for red or itchy eyes.   Reflux Continue lifestyle modifications to decrease reflux  Continue the other medications as listed in your chart.  Call me if this plan is not working well for you  Follow up in 6 months or sooner if needed   Control of Mold Allergen Mold and fungi can grow on a variety of surfaces provided certain temperature and moisture conditions exist.  Outdoor molds grow on plants, decaying vegetation and soil.  The major outdoor mold, Alternaria and Cladosporium, are found in very high numbers during hot and dry conditions.  Generally, a late Summer - Fall peak is seen for common outdoor fungal spores.  Rain will temporarily lower outdoor mold spore count, but counts rise rapidly when the rainy period ends.  The most important indoor molds are Aspergillus and Penicillium.  Dark, humid and poorly ventilated basements are ideal sites for mold growth.  The next most common sites of mold growth are the bathroom and the kitchen.  Outdoor [employer] Use air conditioning and keep windows closed Avoid exposure to decaying vegetation. Avoid leaf raking. Avoid grain handling. Consider wearing a face mask if working in moldy areas.  Indoor Mold Control Maintain humidity below 50%. Clean washable surfaces with 5% bleach solution. Remove sources e.g. Contaminated carpets.

## 2021-08-16 NOTE — Addendum Note (Signed)
Addended by: Berna Bue on: 08/16/2021 08:15 AM   Modules accepted: Orders

## 2021-08-30 ENCOUNTER — Ambulatory Visit (INDEPENDENT_AMBULATORY_CARE_PROVIDER_SITE_OTHER): Payer: Medicare HMO

## 2021-08-30 DIAGNOSIS — J309 Allergic rhinitis, unspecified: Secondary | ICD-10-CM

## 2021-09-05 ENCOUNTER — Ambulatory Visit (INDEPENDENT_AMBULATORY_CARE_PROVIDER_SITE_OTHER): Payer: Medicare HMO

## 2021-09-05 DIAGNOSIS — J4550 Severe persistent asthma, uncomplicated: Secondary | ICD-10-CM | POA: Diagnosis not present

## 2021-09-10 ENCOUNTER — Other Ambulatory Visit: Payer: Self-pay | Admitting: Family Medicine

## 2021-10-01 ENCOUNTER — Ambulatory Visit (INDEPENDENT_AMBULATORY_CARE_PROVIDER_SITE_OTHER): Payer: Medicare HMO | Admitting: *Deleted

## 2021-10-01 DIAGNOSIS — J309 Allergic rhinitis, unspecified: Secondary | ICD-10-CM

## 2021-10-03 ENCOUNTER — Telehealth: Payer: Self-pay | Admitting: *Deleted

## 2021-10-03 NOTE — Telephone Encounter (Signed)
Tried calling pt but left a message for a return call. I asked Thurston Hole if she would be able to get her Harrington Challenger and allergy injection on the same day- unfortunately Thurston Hole does not feel comfortable having her do both on the same day due to her other medications. Her Harrington Challenger is scheduled for 9/20 and her allergy injection is due 9/18.

## 2021-10-05 NOTE — Telephone Encounter (Signed)
Patient called back inquiring about if she could get her allergy shot and Fasenra on the same day. Patient given message from Thermon Leyland, FNP that she is not comfortable having her do both on the same day.

## 2021-10-28 ENCOUNTER — Other Ambulatory Visit: Payer: Self-pay | Admitting: Family Medicine

## 2021-10-29 ENCOUNTER — Ambulatory Visit (INDEPENDENT_AMBULATORY_CARE_PROVIDER_SITE_OTHER): Payer: Medicare HMO

## 2021-10-29 DIAGNOSIS — J309 Allergic rhinitis, unspecified: Secondary | ICD-10-CM

## 2021-10-29 NOTE — Telephone Encounter (Signed)
Spoke to patient and patient states that the pharmacy said she did not have anymore refills on this medication and that the prescription was old. Patient states that she only uses the inhaler as needed.

## 2021-10-31 ENCOUNTER — Ambulatory Visit (INDEPENDENT_AMBULATORY_CARE_PROVIDER_SITE_OTHER): Payer: Medicare HMO

## 2021-10-31 DIAGNOSIS — J4550 Severe persistent asthma, uncomplicated: Secondary | ICD-10-CM

## 2021-11-26 ENCOUNTER — Ambulatory Visit (INDEPENDENT_AMBULATORY_CARE_PROVIDER_SITE_OTHER): Payer: Medicare HMO

## 2021-11-26 DIAGNOSIS — J309 Allergic rhinitis, unspecified: Secondary | ICD-10-CM

## 2021-12-10 ENCOUNTER — Ambulatory Visit (INDEPENDENT_AMBULATORY_CARE_PROVIDER_SITE_OTHER): Payer: Medicare HMO

## 2021-12-10 DIAGNOSIS — J309 Allergic rhinitis, unspecified: Secondary | ICD-10-CM

## 2021-12-25 ENCOUNTER — Ambulatory Visit (INDEPENDENT_AMBULATORY_CARE_PROVIDER_SITE_OTHER): Payer: Medicare HMO

## 2021-12-25 DIAGNOSIS — J309 Allergic rhinitis, unspecified: Secondary | ICD-10-CM | POA: Diagnosis not present

## 2021-12-26 ENCOUNTER — Ambulatory Visit (INDEPENDENT_AMBULATORY_CARE_PROVIDER_SITE_OTHER): Payer: Medicare HMO

## 2021-12-26 DIAGNOSIS — J4550 Severe persistent asthma, uncomplicated: Secondary | ICD-10-CM

## 2021-12-28 ENCOUNTER — Telehealth: Payer: Self-pay | Admitting: Family Medicine

## 2021-12-28 ENCOUNTER — Other Ambulatory Visit: Payer: Self-pay | Admitting: Family Medicine

## 2021-12-28 MED ORDER — ALBUTEROL SULFATE HFA 108 (90 BASE) MCG/ACT IN AERS: 2.0000 | INHALATION_SPRAY | RESPIRATORY_TRACT | 1 refills | Status: DC | PRN

## 2021-12-28 NOTE — Telephone Encounter (Signed)
Ventolin rx sent to [facility].

## 2021-12-28 NOTE — Telephone Encounter (Signed)
Patient called stating she needs a refill on albuterol (VENTOLIN HFA) 108 (90 Base) MCG/ACT inhaler [[id number]]  please advise

## 2022-01-08 ENCOUNTER — Ambulatory Visit (INDEPENDENT_AMBULATORY_CARE_PROVIDER_SITE_OTHER): Payer: Medicare HMO

## 2022-01-08 DIAGNOSIS — J309 Allergic rhinitis, unspecified: Secondary | ICD-10-CM | POA: Diagnosis not present

## 2022-01-21 ENCOUNTER — Ambulatory Visit (INDEPENDENT_AMBULATORY_CARE_PROVIDER_SITE_OTHER): Payer: Medicare HMO

## 2022-01-21 DIAGNOSIS — J309 Allergic rhinitis, unspecified: Secondary | ICD-10-CM | POA: Diagnosis not present

## 2022-02-18 NOTE — Patient Instructions (Incomplete)
Pneumonia Suggest for you to get a chest xray We will call you when the results become available  Asthma  Continue montelukast 10 mg once a day to prevent cough or wheeze Continue albuterol inhaler 2 puffs every 4 hours as needed or albuterol via nebulizer 1 unit dose once every 4 hours as needed for cough or wheeze Continue Fasenra 30 mg once every 8 weeks  Allergic rhinitis Continue with your allergy injections on the current schedule of once every 4 weeks and have access to an epinephrine auto-injector set Let's retest your allergy to mold when you are feeling better Continue Xyzal once a day as needed for a runny nose.  Begin Ryaltris 2 sprays in each nostril twice a day for nasal symptoms Consider saline nasal rinses as needed for nasal symptoms. Use this before any medicated nasal sprays for best result Continue allergen avoidance measures directed toward mold as listed below  Allergic conjunctivitis Continue olopatadine 1 drop in each eye once a day as needed for red or itchy eyes.   Reflux Continue lifestyle modifications to decrease reflux Continue Dexilant once a day as previously prescribed  Continue the other medications as listed in your chart.  Call me if this plan is not working well for you  Follow up in 2 months or sooner if needed   Control of Mold Allergen Mold and fungi can grow on a variety of surfaces provided certain temperature and moisture conditions exist.  Outdoor molds grow on plants, decaying vegetation and soil.  The major outdoor mold, Alternaria and Cladosporium, are found in very high numbers during hot and dry conditions.  Generally, a late Summer - Fall peak is seen for common outdoor fungal spores.  Rain will temporarily lower outdoor mold spore count, but counts rise rapidly when the rainy period ends.  The most important indoor molds are Aspergillus and Penicillium.  Dark, humid and poorly ventilated basements are ideal sites for mold growth.  The  next most common sites of mold growth are the bathroom and the kitchen.  Outdoor Mold Control Use air conditioning and keep windows closed Avoid exposure to decaying vegetation. Avoid leaf raking. Avoid grain handling. Consider wearing a face mask if working in moldy areas.  Indoor Mold Control Maintain humidity below 50%. Clean washable surfaces with 5% bleach solution. Remove sources e.g. Contaminated carpets. 

## 2022-02-18 NOTE — Progress Notes (Unsigned)
   [place] [address] Dept: [phone]  FOLLOW UP NOTE  Patient ID: Carolyn Benson, female    DOB: 1956/01/24  Age: 67 y.o. MRN: [id number] Date of Office Visit: 02/19/2022  Assessment  Chief Complaint: No chief complaint on file.  HPI Carolyn Benson is a 67 year old old female who presents to the clinic for follow-up visit.  She was last seen in this clinic on 02/15/2021 by Gareth Morgan, FNP, for evaluation of asthma, allergic rhinitis, allergic conjunctivitis, and reflux.   Drug Allergies:  Allergies  Allergen Reactions   Ibuprofen Swelling   Ciprofloxacin Rash   Clarithromycin Nausea And Vomiting   Codeine Rash    Swelling   Doxycycline Nausea And Vomiting   Metoclopramide Rash    PT DOES NOT KNOW   Omnipaque [Iohexol] Other (See Comments)    Kidneys bleed Kidneys bleed   Sulfasalazine Rash   Ozempic (0.25 Or 0.5 Mg-Dose) [Semaglutide(0.25 Or 0.5mg -Dos)]     Acid reflux and vomiting    Phenergan [Promethazine] Diarrhea   Phenylpropanol Nausea And Vomiting   Pregabalin    Butorphanol Nausea Only   Butorphanol Tartrate Nausea And Vomiting and Nausea Only   Darvocet [Propoxyphene N-Acetaminophen] Nausea Only   Other Rash    Fluoroquinolones     Ranitidine Hcl Rash   Stadol [Butorphanol Tartrate] Nausea Only   Sulfa Antibiotics Rash    Physical Exam: There were no vitals taken for this visit.   Physical Exam  Diagnostics:    Assessment and Plan: No diagnosis found.  No orders of the defined types were placed in this encounter.   There are no Patient Instructions on file for this visit.  No follow-ups on file.    Thank you for the opportunity to care for this patient.  Please do not hesitate to contact me with questions.  Gareth Morgan, FNP [facility]

## 2022-02-19 ENCOUNTER — Encounter: Payer: Self-pay | Admitting: Family Medicine

## 2022-02-19 ENCOUNTER — Other Ambulatory Visit: Payer: Self-pay

## 2022-02-19 ENCOUNTER — Ambulatory Visit: Payer: Medicare HMO | Admitting: Family Medicine

## 2022-02-19 VITALS — BP 114/66 | HR 88 | Temp 97.9°F | Resp 18 | Ht 63.0 in | Wt 222.5 lb

## 2022-02-19 DIAGNOSIS — Z8701 Personal history of pneumonia (recurrent): Secondary | ICD-10-CM | POA: Insufficient documentation

## 2022-02-19 DIAGNOSIS — J4550 Severe persistent asthma, uncomplicated: Secondary | ICD-10-CM | POA: Diagnosis not present

## 2022-02-19 DIAGNOSIS — K219 Gastro-esophageal reflux disease without esophagitis: Secondary | ICD-10-CM | POA: Diagnosis not present

## 2022-02-19 DIAGNOSIS — J3089 Other allergic rhinitis: Secondary | ICD-10-CM

## 2022-02-19 DIAGNOSIS — H1013 Acute atopic conjunctivitis, bilateral: Secondary | ICD-10-CM

## 2022-02-20 ENCOUNTER — Ambulatory Visit: Payer: Medicare HMO

## 2022-02-25 ENCOUNTER — Ambulatory Visit (INDEPENDENT_AMBULATORY_CARE_PROVIDER_SITE_OTHER): Payer: Medicare HMO

## 2022-02-25 DIAGNOSIS — J309 Allergic rhinitis, unspecified: Secondary | ICD-10-CM | POA: Diagnosis not present

## 2022-03-11 ENCOUNTER — Other Ambulatory Visit: Payer: Self-pay | Admitting: Family Medicine

## 2022-04-04 ENCOUNTER — Ambulatory Visit (INDEPENDENT_AMBULATORY_CARE_PROVIDER_SITE_OTHER): Payer: Medicare HMO

## 2022-04-04 DIAGNOSIS — J309 Allergic rhinitis, unspecified: Secondary | ICD-10-CM | POA: Diagnosis not present

## 2022-04-15 DIAGNOSIS — J302 Other seasonal allergic rhinitis: Secondary | ICD-10-CM | POA: Diagnosis not present

## 2022-04-15 NOTE — Progress Notes (Signed)
VIALS EXP 04-15-23

## 2022-04-16 ENCOUNTER — Ambulatory Visit (INDEPENDENT_AMBULATORY_CARE_PROVIDER_SITE_OTHER): Payer: Medicare HMO

## 2022-04-16 DIAGNOSIS — J4550 Severe persistent asthma, uncomplicated: Secondary | ICD-10-CM | POA: Diagnosis not present

## 2022-04-23 ENCOUNTER — Ambulatory Visit: Payer: Medicare HMO | Admitting: Family Medicine

## 2022-04-23 NOTE — Progress Notes (Deleted)
   [place] [address] Dept: [phone]  FOLLOW UP NOTE  Patient ID: Carolyn Benson, female    DOB: [birth year]  Age: 67 y.o. MRN: [id number] Date of Office Visit: 04/23/2022  Assessment  Chief Complaint: No chief complaint on file.  HPI Carolyn Benson is a 67 year old female who presents to the clinic for follow-up visit.  She was last seen in this clinic on 02/19/2022 by Gareth Morgan, FNP, for evaluation of asthma, allergic rhinitis, allergic conjunctivitis, reflux, and possible pneumonia.  At that time, she did not want to get a chest x-ray and reported that she was going to get a chest x-ray with her PCP over the next several days.   Drug Allergies:  Allergies  Allergen Reactions   Ibuprofen Swelling   Ciprofloxacin Rash   Clarithromycin Nausea And Vomiting   Codeine Rash    Swelling   Doxycycline Nausea And Vomiting   Metoclopramide Rash    PT DOES NOT KNOW   Omnipaque [Iohexol] Other (See Comments)    Kidneys bleed Kidneys bleed   Sulfasalazine Rash   Ozempic (0.25 Or 0.5 Mg-Dose) [Semaglutide(0.25 Or 0.5mg -Dos)]     Acid reflux and vomiting    Phenergan [Promethazine] Diarrhea   Phenylpropanol Nausea And Vomiting   Pregabalin    Butorphanol Nausea Only   Butorphanol Tartrate Nausea And Vomiting and Nausea Only   Darvocet [Propoxyphene N-Acetaminophen] Nausea Only   Other Rash    Fluoroquinolones     Ranitidine Hcl Rash   Stadol [Butorphanol Tartrate] Nausea Only   Sulfa Antibiotics Rash    Physical Exam: There were no vitals taken for this visit.   Physical Exam  Diagnostics:    Assessment and Plan: No diagnosis found.  No orders of the defined types were placed in this encounter.   There are no Patient Instructions on file for this visit.  No follow-ups on file.    Thank you for the opportunity to care for this patient.  Please do not hesitate to contact me with questions.  Gareth Morgan, FNP [facility]

## 2022-04-30 ENCOUNTER — Other Ambulatory Visit: Payer: Self-pay

## 2022-04-30 ENCOUNTER — Ambulatory Visit: Payer: Medicare HMO | Admitting: Family Medicine

## 2022-04-30 ENCOUNTER — Encounter: Payer: Self-pay | Admitting: Family Medicine

## 2022-04-30 VITALS — BP 134/76 | HR 85 | Temp 97.9°F | Resp 18 | Ht 63.0 in | Wt 226.4 lb

## 2022-04-30 DIAGNOSIS — K219 Gastro-esophageal reflux disease without esophagitis: Secondary | ICD-10-CM

## 2022-04-30 DIAGNOSIS — J309 Allergic rhinitis, unspecified: Secondary | ICD-10-CM

## 2022-04-30 DIAGNOSIS — H1013 Acute atopic conjunctivitis, bilateral: Secondary | ICD-10-CM

## 2022-04-30 DIAGNOSIS — J4550 Severe persistent asthma, uncomplicated: Secondary | ICD-10-CM

## 2022-04-30 DIAGNOSIS — Z8701 Personal history of pneumonia (recurrent): Secondary | ICD-10-CM

## 2022-04-30 MED ORDER — FLUTICASONE-SALMETEROL 250-50 MCG/ACT IN AEPB: INHALATION_SPRAY | RESPIRATORY_TRACT | 1 refills | Status: DC

## 2022-04-30 MED ORDER — ALBUTEROL SULFATE HFA 108 (90 BASE) MCG/ACT IN AERS: 2.0000 | INHALATION_SPRAY | RESPIRATORY_TRACT | 2 refills | Status: DC | PRN

## 2022-04-30 NOTE — Patient Instructions (Addendum)
Asthma  Continue montelukast 10 mg once a day to prevent cough or wheeze Continue albuterol inhaler 2 puffs every 4 hours as needed or albuterol via nebulizer 1 unit dose once every 4 hours as needed for cough or wheeze You may use albuterol 2 puffs 5 to 15 minutes before activity to decrease cough or wheeze Continue Fasenra 30 mg once every 8 weeks For asthma flare, continue fluticasone 110-2 puffs twice a day for 2 weeks or until cough and wheeze free.  This will replace Flovent 110 for asthma flare  Allergic rhinitis Continue with your allergy injections on the current schedule of once every 4 weeks and have access to an epinephrine auto-injector set Let's retest your allergy to mold at your next office visit.  Remember to stop antihistamines for 3 days before your testing appointment Continue Xyzal once a day as needed for a runny nose.  Continue Ryaltris 2 sprays in each nostril twice a day for nasal symptoms Consider saline nasal rinses as needed for nasal symptoms. Use this before any medicated nasal sprays for best result Continue allergen avoidance measures directed toward mold as listed below  Allergic conjunctivitis Continue olopatadine 1 drop in each eye once a day as needed for red or itchy eyes.   Reflux Continue lifestyle modifications to decrease reflux Continue Dexilant once a day as previously prescribed  Continue the other medications as listed in your chart.  Call me if this plan is not working well for you  Follow up in 6 months or sooner if needed   Control of Mold Allergen Mold and fungi can grow on a variety of surfaces provided certain temperature and moisture conditions exist.  Outdoor molds grow on plants, decaying vegetation and soil.  The major outdoor mold, Alternaria and Cladosporium, are found in very high numbers during hot and dry conditions.  Generally, a late Summer - Fall peak is seen for common outdoor fungal spores.  Rain will temporarily lower  outdoor mold spore count, but counts rise rapidly when the rainy period ends.  The most important indoor molds are Aspergillus and Penicillium.  Dark, humid and poorly ventilated basements are ideal sites for mold growth.  The next most common sites of mold growth are the bathroom and the kitchen.  Outdoor [employer] Use air conditioning and keep windows closed Avoid exposure to decaying vegetation. Avoid leaf raking. Avoid grain handling. Consider wearing a face mask if working in moldy areas.  Indoor Mold Control Maintain humidity below 50%. Clean washable surfaces with 5% bleach solution. Remove sources e.g. Contaminated carpets.

## 2022-04-30 NOTE — Progress Notes (Signed)
[place] [id number] Dept: [phone]  FOLLOW UP NOTE  Patient ID: Carolyn Benson, female    DOB: 10/27/1955  Age: 67 y.o. MRN: [id number] Date of Office Visit: 04/30/2022  Assessment  Chief Complaint: Follow-up, Asthma (Doing well on asthma), and Medication Refill  HPI Carolyn Benson is a 67 year old female who presents to the clinic for follow-up visit.  She was last seen in this clinic on 02/19/2022 by Gareth Morgan, FNP, for evaluation of asthma, allergic rhinitis on allergen immunotherapy, allergic conjunctivitis, and reflux.  In the interim, she has had an additional antibiotic for treatment of pneumonia with resolution of symptoms.  At today's visit, she reports her asthma has been moderately well-controlled with occasional wheeze over the last week with the increase in pollens in the air for which she used albuterol with relief of wheeze.  She denies shortness of breath or cough with activity or rest.  She continues montelukast 10 mg once a day, Fasenra injections once every 8 weeks, and rarely uses albuterol for relief of symptoms.  She is not currently using an asthma maintenance inhaler.  Allergic rhinitis is reported as moderately well-controlled with occasional sneezing as the main symptom.  She continues Xyzal 5 mg once a day, occasionally uses Ryaltris nasal spray and is not currently using nasal saline rinses.  She continues allergen immunotherapy directed toward mold with no large or local reactions.  She reports significant decrease in her symptoms of allergic rhinitis while continuing on allergen immunotherapy.  She is not particularly interested in stopping allergen immunotherapy at this time, however, is willing to retest for mold allergy at her next visit.  Allergic conjunctivitis is reported as well-controlled with no medical intervention at this time  Reflux is reported as well-controlled with no symptoms including heartburn or vomiting.  She  continues Dexilant at this time, however, reports that her insurance company will no longer cover this medication and the cost to cover this without insurance may be prohibitive.  Her current medications are listed in the chart.  Drug Allergies:  Allergies  Allergen Reactions   Ibuprofen Swelling   Ciprofloxacin Rash   Clarithromycin Nausea And Vomiting   Codeine Rash    Swelling   Doxycycline Nausea And Vomiting   Metoclopramide Rash    PT DOES NOT KNOW   Omnipaque [Iohexol] Other (See Comments)    Kidneys bleed Kidneys bleed   Sulfasalazine Rash   Ozempic (0.25 Or 0.5 Mg-Dose) [Semaglutide(0.25 Or 0.5mg -Dos)]     Acid reflux and vomiting    Phenergan [Promethazine] Diarrhea   Phenylpropanol Nausea And Vomiting   Pregabalin    Butorphanol Nausea Only   Butorphanol Tartrate Nausea And Vomiting and Nausea Only   Darvocet [Propoxyphene N-Acetaminophen] Nausea Only   Other Rash    Fluoroquinolones     Ranitidine Hcl Rash   Stadol [Butorphanol Tartrate] Nausea Only   Sulfa Antibiotics Rash    Physical Exam: BP 134/76 (BP Location: Left Arm, Patient Position: Sitting, Cuff Size: Normal)   Pulse 85   Temp 97.9 F (36.6 C) (Temporal)   Resp 18   Ht 5\' 3"  (1.6 m)   Wt 226 lb 6.4 oz (102.7 kg)   SpO2 96%   BMI 40.10 kg/m    Physical Exam Vitals reviewed.  Constitutional:      Appearance: Normal appearance.  HENT:     Head: Normocephalic and atraumatic.     Right Ear: Tympanic membrane normal.  Left Ear: Tympanic membrane normal.     Nose:     Comments: Bilateral nares slightly erythematous with clear nasal drainage noted.  Pharynx normal.  Ears normal.  Eyes normal.    Mouth/Throat:     Pharynx: Oropharynx is clear.  Eyes:     Conjunctiva/sclera: Conjunctivae normal.  Cardiovascular:     Rate and Rhythm: Normal rate and regular rhythm.     Heart sounds: Normal heart sounds. No murmur heard. Pulmonary:     Effort: Pulmonary effort is normal.     Breath  sounds: Normal breath sounds.     Comments: Lungs clear to auscultation Musculoskeletal:        General: Normal range of motion.     Cervical back: Normal range of motion and neck supple.  Skin:    General: Skin is warm and dry.  Neurological:     Mental Status: She is alert and oriented to person, place, and time.  Psychiatric:        Mood and Affect: Mood normal.        Behavior: Behavior normal.        Thought Content: Thought content normal.        Judgment: Judgment normal.     Diagnostics: FVC 1.45, FEV1 1.20.  Predicted FVC 2.85, predicted FEV1 2.22.  Spirometry indicates restriction.  This is consistent with previous spirometry readings.  Assessment and Plan: 1. Severe persistent asthma without complication   2. Allergic rhinitis, unspecified seasonality, unspecified trigger   3. Perennial allergic conjunctivitis of both eyes   4. Gastroesophageal reflux disease, unspecified whether esophagitis present   5. History of pneumonia     Meds ordered this encounter  Medications   fluticasone-salmeterol (ADVAIR) 250-50 MCG/ACT AEPB    Sig: For asthma flare, begin Advair 250-1 puff twice a day for 2 weeks or until cough and wheeze free    Dispense:  1 each    Refill:  1   albuterol (VENTOLIN HFA) 108 (90 Base) MCG/ACT inhaler    Sig: Inhale 2 puffs into the lungs every 4 (four) hours as needed for wheezing or shortness of breath.    Dispense:  1 each    Refill:  2    Patient Instructions  Asthma  Continue montelukast 10 mg once a day to prevent cough or wheeze Continue albuterol inhaler 2 puffs every 4 hours as needed or albuterol via nebulizer 1 unit dose once every 4 hours as needed for cough or wheeze You may use albuterol 2 puffs 5 to 15 minutes before activity to decrease cough or wheeze Continue Fasenra 30 mg once every 8 weeks For asthma flare, continue fluticasone 110-2 puffs twice a day for 2 weeks or until cough and wheeze free.  This will replace Flovent 110  for asthma flare  Allergic rhinitis Continue with your allergy injections on the current schedule of once every 4 weeks and have access to an epinephrine auto-injector set Let's retest your allergy to mold at your next office visit.  Remember to stop antihistamines for 3 days before your testing appointment Continue Xyzal once a day as needed for a runny nose.  Continue Ryaltris 2 sprays in each nostril twice a day for nasal symptoms Consider saline nasal rinses as needed for nasal symptoms. Use this before any medicated nasal sprays for best result Continue allergen avoidance measures directed toward mold as listed below  Allergic conjunctivitis Continue olopatadine 1 drop in each eye once a day as needed for red  or itchy eyes.   Reflux Continue lifestyle modifications to decrease reflux Continue Dexilant once a day as previously prescribed  Continue the other medications as listed in your chart.  Call me if this plan is not working well for you  Follow up in 6 months or sooner if needed    Return in about 6 months (around 10/31/2022), or if symptoms worsen or fail to improve.    Thank you for the opportunity to care for this patient.  Please do not hesitate to contact me with questions.  Gareth Morgan, FNP [facility]

## 2022-05-14 ENCOUNTER — Ambulatory Visit (INDEPENDENT_AMBULATORY_CARE_PROVIDER_SITE_OTHER): Payer: Medicare HMO

## 2022-05-14 DIAGNOSIS — J309 Allergic rhinitis, unspecified: Secondary | ICD-10-CM | POA: Diagnosis not present

## 2022-05-28 ENCOUNTER — Ambulatory Visit (INDEPENDENT_AMBULATORY_CARE_PROVIDER_SITE_OTHER): Payer: Medicare HMO | Admitting: *Deleted

## 2022-05-28 DIAGNOSIS — J309 Allergic rhinitis, unspecified: Secondary | ICD-10-CM | POA: Diagnosis not present

## 2022-05-30 ENCOUNTER — Other Ambulatory Visit: Payer: Self-pay | Admitting: *Deleted

## 2022-05-30 MED ORDER — FASENRA 30 MG/ML ~~LOC~~ SOSY: 30.0000 mg | PREFILLED_SYRINGE | SUBCUTANEOUS | 6 refills | Status: DC

## 2022-06-11 ENCOUNTER — Ambulatory Visit (INDEPENDENT_AMBULATORY_CARE_PROVIDER_SITE_OTHER): Payer: Medicare HMO

## 2022-06-11 DIAGNOSIS — J4550 Severe persistent asthma, uncomplicated: Secondary | ICD-10-CM

## 2022-06-14 ENCOUNTER — Ambulatory Visit (INDEPENDENT_AMBULATORY_CARE_PROVIDER_SITE_OTHER): Payer: Medicare HMO

## 2022-06-14 DIAGNOSIS — J309 Allergic rhinitis, unspecified: Secondary | ICD-10-CM

## 2022-06-25 ENCOUNTER — Ambulatory Visit (INDEPENDENT_AMBULATORY_CARE_PROVIDER_SITE_OTHER): Payer: Medicare HMO

## 2022-06-25 DIAGNOSIS — J309 Allergic rhinitis, unspecified: Secondary | ICD-10-CM | POA: Diagnosis not present

## 2022-06-26 ENCOUNTER — Other Ambulatory Visit: Payer: Self-pay | Admitting: Family Medicine

## 2022-06-27 NOTE — Telephone Encounter (Signed)
Deny this refill please. Likely pharmacy generated. Thank you

## 2022-07-10 ENCOUNTER — Ambulatory Visit (INDEPENDENT_AMBULATORY_CARE_PROVIDER_SITE_OTHER): Payer: Medicare HMO

## 2022-07-10 DIAGNOSIS — J309 Allergic rhinitis, unspecified: Secondary | ICD-10-CM

## 2022-07-22 ENCOUNTER — Ambulatory Visit (INDEPENDENT_AMBULATORY_CARE_PROVIDER_SITE_OTHER): Payer: Medicare HMO

## 2022-07-22 DIAGNOSIS — J309 Allergic rhinitis, unspecified: Secondary | ICD-10-CM | POA: Diagnosis not present

## 2022-07-28 ENCOUNTER — Other Ambulatory Visit: Payer: Self-pay | Admitting: Family Medicine

## 2022-08-06 ENCOUNTER — Ambulatory Visit (INDEPENDENT_AMBULATORY_CARE_PROVIDER_SITE_OTHER): Payer: Medicare HMO

## 2022-08-06 DIAGNOSIS — J4550 Severe persistent asthma, uncomplicated: Secondary | ICD-10-CM | POA: Diagnosis not present

## 2022-08-19 ENCOUNTER — Ambulatory Visit (INDEPENDENT_AMBULATORY_CARE_PROVIDER_SITE_OTHER): Payer: Medicare HMO

## 2022-08-19 DIAGNOSIS — J309 Allergic rhinitis, unspecified: Secondary | ICD-10-CM | POA: Diagnosis not present

## 2022-09-12 ENCOUNTER — Other Ambulatory Visit: Payer: Self-pay | Admitting: Family Medicine

## 2022-09-16 ENCOUNTER — Ambulatory Visit (INDEPENDENT_AMBULATORY_CARE_PROVIDER_SITE_OTHER): Payer: Medicare HMO

## 2022-09-16 DIAGNOSIS — J309 Allergic rhinitis, unspecified: Secondary | ICD-10-CM

## 2022-10-01 ENCOUNTER — Ambulatory Visit (INDEPENDENT_AMBULATORY_CARE_PROVIDER_SITE_OTHER): Payer: Medicare HMO

## 2022-10-01 DIAGNOSIS — J4550 Severe persistent asthma, uncomplicated: Secondary | ICD-10-CM

## 2022-10-22 ENCOUNTER — Ambulatory Visit (INDEPENDENT_AMBULATORY_CARE_PROVIDER_SITE_OTHER): Payer: Medicare HMO

## 2022-10-22 DIAGNOSIS — J309 Allergic rhinitis, unspecified: Secondary | ICD-10-CM

## 2022-10-22 NOTE — Progress Notes (Signed)
Vial diluted

## 2022-11-04 ENCOUNTER — Other Ambulatory Visit: Payer: Self-pay | Admitting: Family Medicine

## 2022-11-04 NOTE — Progress Notes (Unsigned)
[address] [place] [address] Dept: [phone]  FOLLOW UP NOTE  Patient ID: Carolyn Benson, female    DOB: 1955-08-18  Age: 67 y.o. MRN: [id number] Date of Office Visit: 11/05/2022  Assessment  Chief Complaint: No chief complaint on file.  HPI [place]    Drug Allergies:  Allergies  Allergen Reactions   Ibuprofen Swelling   Ciprofloxacin Rash   Clarithromycin Nausea And Vomiting   Codeine Rash    Swelling   Doxycycline Nausea And Vomiting   Metoclopramide Rash    PT DOES NOT KNOW   Omnipaque [Iohexol] Other (See Comments)    Kidneys bleed Kidneys bleed   Sulfasalazine Rash   Ozempic (0.25 Or 0.5 Mg-Dose) [Semaglutide(0.25 Or 0.5mg -Dos)]     Acid reflux and vomiting    Phenergan [Promethazine] Diarrhea   Phenylpropanol Nausea And Vomiting   Pregabalin    Butorphanol Nausea Only   Butorphanol Tartrate Nausea And Vomiting and Nausea Only   Darvocet [Propoxyphene N-Acetaminophen] Nausea Only   Other Rash    Fluoroquinolones     Ranitidine Hcl Rash   Stadol [Butorphanol Tartrate] Nausea Only   Sulfa Antibiotics Rash    Physical Exam: There were no vitals taken for this visit.   Physical Exam  Diagnostics:    Assessment and Plan: No diagnosis found.  No orders of the defined types were placed in this encounter.   There are no Patient Instructions on file for this visit.  No follow-ups on file.    Thank you for the opportunity to care for this patient.  Please do not hesitate to contact me with questions.  Thermon Leyland, FNP [facility]

## 2022-11-05 ENCOUNTER — Other Ambulatory Visit: Payer: Self-pay | Admitting: Family

## 2022-11-05 ENCOUNTER — Encounter: Payer: Self-pay | Admitting: Family Medicine

## 2022-11-05 ENCOUNTER — Ambulatory Visit: Payer: Medicare HMO | Admitting: Family Medicine

## 2022-11-05 ENCOUNTER — Other Ambulatory Visit: Payer: Self-pay

## 2022-11-05 VITALS — BP 128/68 | HR 64 | Temp 98.1°F | Resp 20 | Wt 222.8 lb

## 2022-11-05 DIAGNOSIS — K219 Gastro-esophageal reflux disease without esophagitis: Secondary | ICD-10-CM | POA: Diagnosis not present

## 2022-11-05 DIAGNOSIS — H1013 Acute atopic conjunctivitis, bilateral: Secondary | ICD-10-CM

## 2022-11-05 DIAGNOSIS — J3089 Other allergic rhinitis: Secondary | ICD-10-CM | POA: Diagnosis not present

## 2022-11-05 DIAGNOSIS — J4550 Severe persistent asthma, uncomplicated: Secondary | ICD-10-CM | POA: Diagnosis not present

## 2022-11-05 NOTE — Patient Instructions (Addendum)
Asthma  Continue montelukast 10 mg once a day to prevent cough or wheeze Continue albuterol inhaler 2 puffs every 4 hours as needed or albuterol via nebulizer 1 unit dose once every 4 hours as needed for cough or wheeze You may use albuterol 2 puffs 5 to 15 minutes before activity to decrease cough or wheeze Continue Fasenra 30 mg once every 8 weeks For asthma flare, continue Flovent 110-2 puffs twice a day for 2 weeks or until cough and wheeze free.    Allergic rhinitis Continue with your allergy injections on the current schedule of once every 4 weeks and have access to an epinephrine auto-injector set Let's retest your allergy to mold at your next office visit.  Remember to stop antihistamines for 3 days before your testing appointment Continue Xyzal once a day as needed for a runny nose.  Continue Ryaltris 2 sprays in each nostril twice a day for nasal symptoms Consider saline nasal rinses as needed for nasal symptoms. Use this before any medicated nasal sprays for best result Continue allergen avoidance measures directed toward mold as listed below  Allergic conjunctivitis Continue olopatadine 1 drop in each eye once a day as needed for red or itchy eyes.   Reflux Continue lifestyle modifications to decrease reflux Continue Dexilant once a day as previously prescribed  Continue the other medications as listed in your chart.  Call me if this plan is not working well for you  Follow up in 6 months or sooner if needed   Control of Mold Allergen Mold and fungi can grow on a variety of surfaces provided certain temperature and moisture conditions exist.  Outdoor molds grow on plants, decaying vegetation and soil.  The major outdoor mold, Alternaria and Cladosporium, are found in very high numbers during hot and dry conditions.  Generally, a late Summer - Fall peak is seen for common outdoor fungal spores.  Rain will temporarily lower outdoor mold spore count, but counts rise rapidly when  the rainy period ends.  The most important indoor molds are Aspergillus and Penicillium.  Dark, humid and poorly ventilated basements are ideal sites for mold growth.  The next most common sites of mold growth are the bathroom and the kitchen.  Outdoor [employer] Use air conditioning and keep windows closed Avoid exposure to decaying vegetation. Avoid leaf raking. Avoid grain handling. Consider wearing a face mask if working in moldy areas.  Indoor Mold Control Maintain humidity below 50%. Clean washable surfaces with 5% bleach solution. Remove sources e.g. Contaminated carpets.

## 2022-11-19 ENCOUNTER — Ambulatory Visit (INDEPENDENT_AMBULATORY_CARE_PROVIDER_SITE_OTHER): Payer: Medicare HMO

## 2022-11-19 DIAGNOSIS — J309 Allergic rhinitis, unspecified: Secondary | ICD-10-CM

## 2022-11-26 ENCOUNTER — Ambulatory Visit: Payer: Medicare HMO

## 2022-11-26 DIAGNOSIS — J4550 Severe persistent asthma, uncomplicated: Secondary | ICD-10-CM | POA: Diagnosis not present

## 2022-12-17 ENCOUNTER — Ambulatory Visit (INDEPENDENT_AMBULATORY_CARE_PROVIDER_SITE_OTHER): Payer: Self-pay

## 2022-12-17 DIAGNOSIS — J309 Allergic rhinitis, unspecified: Secondary | ICD-10-CM | POA: Diagnosis not present

## 2022-12-25 ENCOUNTER — Telehealth: Payer: Self-pay | Admitting: Family Medicine

## 2022-12-25 MED ORDER — EPINEPHRINE 0.3 MG/0.3ML IJ SOAJ: INTRAMUSCULAR | 1 refills | Status: DC

## 2022-12-25 NOTE — Telephone Encounter (Signed)
Sent in epipen to [facility].

## 2022-12-25 NOTE — Telephone Encounter (Signed)
Pt request a refill for Epi[facility]Pen [facility] to [facility] [place].

## 2022-12-30 ENCOUNTER — Ambulatory Visit (INDEPENDENT_AMBULATORY_CARE_PROVIDER_SITE_OTHER): Payer: Self-pay

## 2022-12-30 DIAGNOSIS — J309 Allergic rhinitis, unspecified: Secondary | ICD-10-CM | POA: Diagnosis not present

## 2023-01-13 ENCOUNTER — Ambulatory Visit (INDEPENDENT_AMBULATORY_CARE_PROVIDER_SITE_OTHER): Payer: Medicare HMO

## 2023-01-13 DIAGNOSIS — J309 Allergic rhinitis, unspecified: Secondary | ICD-10-CM

## 2023-01-14 ENCOUNTER — Other Ambulatory Visit: Payer: Self-pay | Admitting: Family Medicine

## 2023-01-21 ENCOUNTER — Ambulatory Visit: Payer: Medicare HMO

## 2023-01-21 DIAGNOSIS — J4550 Severe persistent asthma, uncomplicated: Secondary | ICD-10-CM

## 2023-02-13 ENCOUNTER — Other Ambulatory Visit: Payer: Self-pay | Admitting: *Deleted

## 2023-02-13 MED ORDER — FASENRA 30 MG/ML ~~LOC~~ SOSY: 30.0000 mg | PREFILLED_SYRINGE | SUBCUTANEOUS | 6 refills | Status: AC

## 2023-02-17 ENCOUNTER — Ambulatory Visit (INDEPENDENT_AMBULATORY_CARE_PROVIDER_SITE_OTHER): Payer: Medicare HMO | Admitting: *Deleted

## 2023-02-17 DIAGNOSIS — J309 Allergic rhinitis, unspecified: Secondary | ICD-10-CM | POA: Diagnosis not present

## 2023-03-18 ENCOUNTER — Ambulatory Visit: Payer: Medicare HMO

## 2023-03-18 DIAGNOSIS — J4550 Severe persistent asthma, uncomplicated: Secondary | ICD-10-CM

## 2023-03-24 DIAGNOSIS — J302 Other seasonal allergic rhinitis: Secondary | ICD-10-CM | POA: Diagnosis not present

## 2023-03-24 NOTE — Progress Notes (Signed)
 VIALS EXP 03-23-24

## 2023-03-26 ENCOUNTER — Ambulatory Visit (INDEPENDENT_AMBULATORY_CARE_PROVIDER_SITE_OTHER): Payer: Self-pay

## 2023-03-26 DIAGNOSIS — J309 Allergic rhinitis, unspecified: Secondary | ICD-10-CM

## 2023-04-21 ENCOUNTER — Ambulatory Visit (INDEPENDENT_AMBULATORY_CARE_PROVIDER_SITE_OTHER): Payer: Self-pay

## 2023-04-21 DIAGNOSIS — J309 Allergic rhinitis, unspecified: Secondary | ICD-10-CM

## 2023-04-26 ENCOUNTER — Other Ambulatory Visit: Payer: Self-pay | Admitting: Family Medicine

## 2023-05-05 NOTE — Progress Notes (Deleted)
   [address] [place] [address] Dept: [phone]  FOLLOW UP NOTE  Patient ID: Carolyn Benson, female    DOB: 26-Jun-1955  Age: 68 y.o. MRN: [id number] Date of Office Visit: 05/06/2023  Assessment  Chief Complaint: No chief complaint on file.  HPI Carolyn Benson is a 68 year old female who presents to the clinic for follow-up visit.  She was last seen in this clinic on 11/05/2022 by Thermon Leyland, FNP, for evaluation of asthma, allergic rhinitis on allergen immunotherapy, allergic conjunctivitis, and reflux.  She began allergen immunotherapy directed toward mold on 03/07/2015.  Discussed the use of AI scribe software for clinical note transcription with the patient, who gave verbal consent to proceed.  History of Present Illness             Drug Allergies:  Allergies  Allergen Reactions   Ibuprofen Swelling   Ciprofloxacin Rash   Clarithromycin Nausea And Vomiting   Codeine Rash    Swelling   Doxycycline Nausea And Vomiting   Metoclopramide Rash    PT DOES NOT KNOW   Omnipaque [Iohexol] Other (See Comments)    Kidneys bleed Kidneys bleed   Sulfasalazine Rash   Ozempic (0.25 Or 0.5 Mg-Dose) [Semaglutide(0.25 Or 0.5mg -Dos)]     Acid reflux and vomiting    Phenergan [Promethazine] Diarrhea   Phenylpropanol Nausea And Vomiting   Pregabalin    Butorphanol Nausea Only   Butorphanol Tartrate Nausea And Vomiting and Nausea Only   Darvocet [Propoxyphene N-Acetaminophen] Nausea Only   Other Rash    Fluoroquinolones     Ranitidine Hcl Rash   Stadol [Butorphanol Tartrate] Nausea Only   Sulfa Antibiotics Rash    Physical Exam: There were no vitals taken for this visit.   Physical Exam  Diagnostics:    Assessment and Plan: No diagnosis found.  No orders of the defined types were placed in this encounter.   There are no Patient Instructions on file for this visit.  No follow-ups on file.    Thank you for the opportunity to care for this patient.   Please do not hesitate to contact me with questions.  Thermon Leyland, FNP [facility]

## 2023-05-06 ENCOUNTER — Ambulatory Visit: Payer: Medicare HMO | Admitting: Family Medicine

## 2023-05-12 ENCOUNTER — Ambulatory Visit (INDEPENDENT_AMBULATORY_CARE_PROVIDER_SITE_OTHER): Payer: Medicare HMO

## 2023-05-12 DIAGNOSIS — J4550 Severe persistent asthma, uncomplicated: Secondary | ICD-10-CM

## 2023-05-12 NOTE — Progress Notes (Deleted)
   [address] [place] [address] Dept: [phone]  FOLLOW UP NOTE  Patient ID: Carolyn Benson, female    DOB: 03/07/1955  Age: 68 y.o. MRN: [id number] Date of Office Visit: 05/13/2023  Assessment  Chief Complaint: No chief complaint on file.  HPI Carolyn Benson is a 68 year old female who presents to the clinic for follow-up visit.  She was last seen in this clinic on 11/05/2022 by Thermon Leyland, FNP, for evaluation of asthma, allergic rhinitis on allergen immunotherapy, allergic conjunctivitis, and reflux.  She began allergen immunotherapy directed toward mold on 03/07/2015.  Discussed the use of AI scribe software for clinical note transcription with the patient, who gave verbal consent to proceed.  History of Present Illness             Drug Allergies:  Allergies  Allergen Reactions  . Ibuprofen Swelling  . Ciprofloxacin Rash  . Clarithromycin Nausea And Vomiting  . Codeine Rash    Swelling  . Doxycycline Nausea And Vomiting  . Metoclopramide Rash    PT DOES NOT KNOW  . Omnipaque [Iohexol] Other (See Comments)    Kidneys bleed Kidneys bleed  . Sulfasalazine Rash  . Ozempic (0.25 Or 0.5 Mg-Dose) [Semaglutide(0.25 Or 0.5mg -Dos)]     Acid reflux and vomiting   . Phenergan [Promethazine] Diarrhea  . Phenylpropanol Nausea And Vomiting  . Pregabalin   . Butorphanol Nausea Only  . Butorphanol Tartrate Nausea And Vomiting and Nausea Only  . Darvocet [Propoxyphene N-Acetaminophen] Nausea Only  . Other Rash    Fluoroquinolones    . Ranitidine Hcl Rash  . Stadol [Butorphanol Tartrate] Nausea Only  . Sulfa Antibiotics Rash    Physical Exam: There were no vitals taken for this visit.   Physical Exam  Diagnostics:    Assessment and Plan: No diagnosis found.  No orders of the defined types were placed in this encounter.   There are no Patient Instructions on file for this visit.  No follow-ups on file.    Thank you for the opportunity to care  for this patient.  Please do not hesitate to contact me with questions.  Thermon Leyland, FNP [facility]

## 2023-05-13 ENCOUNTER — Ambulatory Visit: Admitting: Family Medicine

## 2023-05-19 NOTE — Progress Notes (Deleted)
   [address] [place] [address] Dept: [phone]  FOLLOW UP NOTE  Patient ID: Carolyn Benson, female    DOB: 05/18/1955  Age: 68 y.o. MRN: [id number] Date of Office Visit: 05/20/2023  Assessment  Chief Complaint: No chief complaint on file.  HPI Carolyn Benson is a 68 year old female who presents to the clinic for follow-up visit.  She was last seen in this clinic on 11/05/2022 by Thermon Leyland, FNP, for evaluation of asthma, allergic rhinitis on allergen immunotherapy, allergic conjunctivitis, and reflux.  She began allergen immunotherapy directed toward mold on 03/07/2015.  Discussed the use of AI scribe software for clinical note transcription with the patient, who gave verbal consent to proceed.  History of Present Illness             Drug Allergies:  Allergies  Allergen Reactions  . Ibuprofen Swelling  . Ciprofloxacin Rash  . Clarithromycin Nausea And Vomiting  . Codeine Rash    Swelling  . Doxycycline Nausea And Vomiting  . Metoclopramide Rash    PT DOES NOT KNOW  . Omnipaque [Iohexol] Other (See Comments)    Kidneys bleed Kidneys bleed  . Sulfasalazine Rash  . Ozempic (0.25 Or 0.5 Mg-Dose) [Semaglutide(0.25 Or 0.5mg -Dos)]     Acid reflux and vomiting   . Phenergan [Promethazine] Diarrhea  . Phenylpropanol Nausea And Vomiting  . Pregabalin   . Butorphanol Nausea Only  . Butorphanol Tartrate Nausea And Vomiting and Nausea Only  . Darvocet [Propoxyphene N-Acetaminophen] Nausea Only  . Other Rash    Fluoroquinolones    . Ranitidine Hcl Rash  . Stadol [Butorphanol Tartrate] Nausea Only  . Sulfa Antibiotics Rash    Physical Exam: There were no vitals taken for this visit.   Physical Exam  Diagnostics:    Assessment and Plan: No diagnosis found.  No orders of the defined types were placed in this encounter.   There are no Patient Instructions on file for this visit.  No follow-ups on file.    Thank you for the opportunity to care  for this patient.  Please do not hesitate to contact me with questions.  Thermon Leyland, FNP [facility]

## 2023-05-20 ENCOUNTER — Ambulatory Visit: Admitting: Family Medicine

## 2023-06-04 ENCOUNTER — Ambulatory Visit (INDEPENDENT_AMBULATORY_CARE_PROVIDER_SITE_OTHER): Payer: Self-pay

## 2023-06-04 DIAGNOSIS — J309 Allergic rhinitis, unspecified: Secondary | ICD-10-CM | POA: Diagnosis not present

## 2023-06-13 ENCOUNTER — Ambulatory Visit (INDEPENDENT_AMBULATORY_CARE_PROVIDER_SITE_OTHER): Payer: Self-pay

## 2023-06-13 DIAGNOSIS — J309 Allergic rhinitis, unspecified: Secondary | ICD-10-CM

## 2023-06-18 ENCOUNTER — Ambulatory Visit (INDEPENDENT_AMBULATORY_CARE_PROVIDER_SITE_OTHER): Payer: Self-pay

## 2023-06-18 DIAGNOSIS — J309 Allergic rhinitis, unspecified: Secondary | ICD-10-CM | POA: Diagnosis not present

## 2023-06-22 NOTE — Progress Notes (Unsigned)
   [address] [place] [address] Dept: [phone]  FOLLOW UP NOTE  Patient ID: Carolyn Benson, female    DOB: 1955-03-18  Age: 68 y.o. MRN: [id number] Date of Office Visit: 06/24/2023  Assessment  Chief Complaint: No chief complaint on file.  HPI Carolyn Benson is a 68 year old female who presents to the clinic for follow-up visit.  She was last seen in this clinic on 11/05/2022 by Marinus Sic, FNP, for evaluation of asthma, allergic rhinitis, allergic conjunctivitis, and reflux.   She began allergen immunotherapy directed toward mold on 03/07/2015  Discussed the use of AI scribe software for clinical note transcription with the patient, who gave verbal consent to proceed.  History of Present Illness      Drug Allergies:  Allergies  Allergen Reactions   Ibuprofen Swelling   Ciprofloxacin Rash   Clarithromycin Nausea And Vomiting   Codeine Rash    Swelling   Doxycycline Nausea And Vomiting   Metoclopramide Rash    PT DOES NOT KNOW   Omnipaque  [Iohexol ] Other (See Comments)    Kidneys bleed Kidneys bleed   Sulfasalazine Rash   Ozempic (0.25 Or 0.5 Mg-Dose) [Semaglutide(0.25 Or 0.5mg -Dos)]     Acid reflux and vomiting    Phenergan  [Promethazine ] Diarrhea   Phenylpropanol Nausea And Vomiting   Pregabalin     Butorphanol Nausea Only   Butorphanol Tartrate Nausea And Vomiting and Nausea Only   Darvocet [Propoxyphene N-Acetaminophen ] Nausea Only   Other Rash    Fluoroquinolones     Ranitidine Hcl Rash   Stadol [Butorphanol Tartrate] Nausea Only   Sulfa Antibiotics Rash    Physical Exam: There were no vitals taken for this visit.   Physical Exam  Diagnostics:    Assessment and Plan: No diagnosis found.  No orders of the defined types were placed in this encounter.   There are no Patient Instructions on file for this visit.  No follow-ups on file.    Thank you for the opportunity to care for this patient.  Please do not hesitate to contact  me with questions.  Marinus Sic, FNP [facility]

## 2023-06-22 NOTE — Patient Instructions (Incomplete)
 Asthma  Continue montelukast  10 mg once a day to prevent cough or wheeze Continue albuterol  inhaler 2 puffs every 4 hours as needed or albuterol  via nebulizer 1 unit dose once every 4 hours as needed for cough or wheeze You may use albuterol  2 puffs 5 to 15 minutes before activity to decrease cough or wheeze Continue Fasenra  30 mg once every 8 weeks For asthma flare, continue Flovent  110-2 puffs twice a day for 2 weeks or until cough and wheeze free.    Allergic rhinitis Continue with your allergy injections on the current schedule of once every 4 weeks and have access to an epinephrine  auto-injector set Let's retest your allergy to mold at your next office visit.  Remember to stop antihistamines for 3 days before your testing appointment Continue Xyzal  once a day as needed for a runny nose.  Continue Ryaltris 2 sprays in each nostril twice a day for nasal symptoms Consider saline nasal rinses as needed for nasal symptoms. Use this before any medicated nasal sprays for best result Continue allergen avoidance measures directed toward mold as listed below  Allergic conjunctivitis Continue olopatadine 1 drop in each eye once a day as needed for red or itchy eyes.   Reflux Continue lifestyle modifications to decrease reflux Continue Dexilant once a day as previously prescribed  Continue the other medications as listed in your chart.  Call me if this plan is not working well for you  Follow up in 6 months or sooner if needed   Control of Mold Allergen Mold and fungi can grow on a variety of surfaces provided certain temperature and moisture conditions exist.  Outdoor molds grow on plants, decaying vegetation and soil.  The major outdoor mold, Alternaria and Cladosporium, are found in very high numbers during hot and dry conditions.  Generally, a late Summer - Fall peak is seen for common outdoor fungal spores.  Rain will temporarily lower outdoor mold spore count, but counts rise rapidly when  the rainy period ends.  The most important indoor molds are Aspergillus and Penicillium.  Dark, humid and poorly ventilated basements are ideal sites for mold growth.  The next most common sites of mold growth are the bathroom and the kitchen.  Outdoor [employer] Use air conditioning and keep windows closed Avoid exposure to decaying vegetation. Avoid leaf raking. Avoid grain handling. Consider wearing a face mask if working in moldy areas.  Indoor Mold Control Maintain humidity below 50%. Clean washable surfaces with 5% bleach solution. Remove sources e.g. Contaminated carpets.

## 2023-06-24 ENCOUNTER — Encounter: Payer: Self-pay | Admitting: Family Medicine

## 2023-06-24 ENCOUNTER — Ambulatory Visit (INDEPENDENT_AMBULATORY_CARE_PROVIDER_SITE_OTHER): Admitting: Family Medicine

## 2023-06-24 VITALS — BP 134/62 | HR 101 | Temp 98.3°F | Resp 18 | Wt 233.0 lb

## 2023-06-24 DIAGNOSIS — H1010 Acute atopic conjunctivitis, unspecified eye: Secondary | ICD-10-CM | POA: Insufficient documentation

## 2023-06-24 DIAGNOSIS — J4550 Severe persistent asthma, uncomplicated: Secondary | ICD-10-CM

## 2023-06-24 DIAGNOSIS — H1013 Acute atopic conjunctivitis, bilateral: Secondary | ICD-10-CM | POA: Diagnosis not present

## 2023-06-24 DIAGNOSIS — K219 Gastro-esophageal reflux disease without esophagitis: Secondary | ICD-10-CM | POA: Diagnosis not present

## 2023-06-25 ENCOUNTER — Telehealth: Payer: Self-pay | Admitting: Family Medicine

## 2023-06-25 NOTE — Telephone Encounter (Signed)
 Patient saw Rexene Catching yesterday and was told that a refill would be sent to pharmacy for her albuterol . Patient states that medication has not been sent in. [facility] [place] [facility] [address] Patients call back number is [phone]

## 2023-06-26 MED ORDER — ALBUTEROL SULFATE HFA 108 (90 BASE) MCG/ACT IN AERS: 2.0000 | INHALATION_SPRAY | RESPIRATORY_TRACT | 1 refills | Status: DC | PRN

## 2023-06-26 NOTE — Telephone Encounter (Signed)
 Refill sent- tried calling patient no answer.

## 2023-07-04 ENCOUNTER — Ambulatory Visit (INDEPENDENT_AMBULATORY_CARE_PROVIDER_SITE_OTHER): Admitting: *Deleted

## 2023-07-04 DIAGNOSIS — J309 Allergic rhinitis, unspecified: Secondary | ICD-10-CM | POA: Diagnosis not present

## 2023-07-08 ENCOUNTER — Ambulatory Visit (INDEPENDENT_AMBULATORY_CARE_PROVIDER_SITE_OTHER)

## 2023-07-08 DIAGNOSIS — J4550 Severe persistent asthma, uncomplicated: Secondary | ICD-10-CM | POA: Diagnosis not present

## 2023-07-29 ENCOUNTER — Ambulatory Visit (INDEPENDENT_AMBULATORY_CARE_PROVIDER_SITE_OTHER)

## 2023-07-29 DIAGNOSIS — J309 Allergic rhinitis, unspecified: Secondary | ICD-10-CM

## 2023-08-25 ENCOUNTER — Other Ambulatory Visit: Payer: Self-pay | Admitting: Family Medicine

## 2023-08-26 ENCOUNTER — Other Ambulatory Visit: Payer: Self-pay | Admitting: Family Medicine

## 2023-08-28 ENCOUNTER — Ambulatory Visit (INDEPENDENT_AMBULATORY_CARE_PROVIDER_SITE_OTHER): Payer: Self-pay

## 2023-08-28 DIAGNOSIS — J309 Allergic rhinitis, unspecified: Secondary | ICD-10-CM | POA: Diagnosis not present

## 2023-09-02 ENCOUNTER — Ambulatory Visit

## 2023-09-02 DIAGNOSIS — J4550 Severe persistent asthma, uncomplicated: Secondary | ICD-10-CM

## 2023-09-16 ENCOUNTER — Ambulatory Visit (INDEPENDENT_AMBULATORY_CARE_PROVIDER_SITE_OTHER): Payer: Self-pay

## 2023-09-16 DIAGNOSIS — J309 Allergic rhinitis, unspecified: Secondary | ICD-10-CM | POA: Diagnosis not present

## 2023-10-14 ENCOUNTER — Ambulatory Visit (INDEPENDENT_AMBULATORY_CARE_PROVIDER_SITE_OTHER): Payer: Self-pay

## 2023-10-14 DIAGNOSIS — J309 Allergic rhinitis, unspecified: Secondary | ICD-10-CM | POA: Diagnosis not present

## 2023-10-28 ENCOUNTER — Ambulatory Visit

## 2023-10-28 DIAGNOSIS — J4550 Severe persistent asthma, uncomplicated: Secondary | ICD-10-CM | POA: Diagnosis not present

## 2023-11-12 ENCOUNTER — Ambulatory Visit: Payer: Self-pay

## 2023-11-12 DIAGNOSIS — J309 Allergic rhinitis, unspecified: Secondary | ICD-10-CM

## 2023-12-03 ENCOUNTER — Ambulatory Visit (INDEPENDENT_AMBULATORY_CARE_PROVIDER_SITE_OTHER): Payer: Self-pay

## 2023-12-03 DIAGNOSIS — J309 Allergic rhinitis, unspecified: Secondary | ICD-10-CM | POA: Diagnosis not present

## 2023-12-09 ENCOUNTER — Ambulatory Visit: Payer: Self-pay

## 2023-12-09 DIAGNOSIS — J309 Allergic rhinitis, unspecified: Secondary | ICD-10-CM | POA: Diagnosis not present

## 2023-12-16 ENCOUNTER — Ambulatory Visit (INDEPENDENT_AMBULATORY_CARE_PROVIDER_SITE_OTHER): Payer: Self-pay

## 2023-12-16 DIAGNOSIS — J309 Allergic rhinitis, unspecified: Secondary | ICD-10-CM

## 2023-12-22 NOTE — Patient Instructions (Signed)
 Asthma  Continue montelukast  10 mg once a day to prevent cough or wheeze Continue albuterol  inhaler 2 puffs every 4 hours as needed or albuterol  via nebulizer 1 unit dose once every 4 hours as needed for cough or wheeze You may use albuterol  2 puffs 5 to 15 minutes before activity to decrease cough or wheeze Continue Fasenra  30 mg once every 8 weeks For asthma flare, continue Flovent  110-2 puffs twice a day for 2 weeks or until cough and wheeze free.    Allergic rhinitis Continue with your allergy injections on the current schedule of once every 4 weeks and have access to an epinephrine  auto-injector set Let's retest your allergy to mold at your next office visit.  Remember to stop antihistamines for 3 days before your testing appointment Continue Xyzal  once a day as needed for a runny nose.  Continue Ryaltris 2 sprays in each nostril twice a day for nasal symptoms Consider saline nasal rinses as needed for nasal symptoms. Use this before any medicated nasal sprays for best result Continue allergen avoidance measures directed toward mold as listed below  Allergic conjunctivitis Continue olopatadine 1 drop in each eye once a day as needed for red or itchy eyes.   Reflux Continue lifestyle modifications to decrease reflux Continue Dexilant once a day as previously prescribed  Continue the other medications as listed in your chart.  Call me if this plan is not working well for you  Follow up in 6 months or sooner if needed   Control of Mold Allergen Mold and fungi can grow on a variety of surfaces provided certain temperature and moisture conditions exist.  Outdoor molds grow on plants, decaying vegetation and soil.  The major outdoor mold, Alternaria and Cladosporium, are found in very high numbers during hot and dry conditions.  Generally, a late Summer - Fall peak is seen for common outdoor fungal spores.  Rain will temporarily lower outdoor mold spore count, but counts rise rapidly when  the rainy period ends.  The most important indoor molds are Aspergillus and Penicillium.  Dark, humid and poorly ventilated basements are ideal sites for mold growth.  The next most common sites of mold growth are the bathroom and the kitchen.  Outdoor [employer] Use air conditioning and keep windows closed Avoid exposure to decaying vegetation. Avoid leaf raking. Avoid grain handling. Consider wearing a face mask if working in moldy areas.  Indoor Mold Control Maintain humidity below 50%. Clean washable surfaces with 5% bleach solution. Remove sources e.g. Contaminated carpets.

## 2023-12-22 NOTE — Progress Notes (Unsigned)
   [address] [place] [address] Dept: [phone]  FOLLOW UP NOTE  Patient ID: Carolyn Benson, female    DOB: 11-26-1955  Age: 68 y.o. MRN: [id number] Date of Office Visit: 12/23/2023  Assessment  Chief Complaint: No chief complaint on file.  HPI Carolyn Benson is a 68 year old female who presents to the clinic for follow-up visit.  She was last seen in this clinic on 06/24/2023 Arlean Mutter, FNP, for evaluation of asthma, allergic rhinitis, allergic conjunctivitis, and reflux.                   Discussed the use of AI scribe software for clinical note transcription with the patient, who gave verbal consent to proceed.  History of Present Illness      Drug Allergies:  Allergies  Allergen Reactions   Ibuprofen Swelling   Ciprofloxacin Rash   Clarithromycin Nausea And Vomiting   Codeine Rash    Swelling   Doxycycline Nausea And Vomiting   Metoclopramide Rash    PT DOES NOT KNOW   Omnipaque  [Iohexol ] Other (See Comments)    Kidneys bleed Kidneys bleed   Sulfasalazine Rash   Ozempic (0.25 Or 0.5 Mg-Dose) [Semaglutide(0.25 Or 0.5mg -Dos)]     Acid reflux and vomiting    Phenergan  [Promethazine ] Diarrhea   Phenylpropanol Nausea And Vomiting   Pregabalin     Butorphanol Nausea Only   Butorphanol Tartrate Nausea And Vomiting and Nausea Only   Darvocet [Propoxyphene N-Acetaminophen ] Nausea Only   Other Rash    Fluoroquinolones     Ranitidine Hcl Rash   Stadol [Butorphanol Tartrate] Nausea Only   Sulfa Antibiotics Rash    Physical Exam: There were no vitals taken for this visit.   Physical Exam  Diagnostics:    Assessment and Plan: No diagnosis found.  No orders of the defined types were placed in this encounter.   There are no Patient Instructions on file for this visit.  No follow-ups on file.    Thank you for the opportunity to care for this patient.  Please do not hesitate to contact me with questions.  Arlean Mutter, FNP [facility]

## 2023-12-23 ENCOUNTER — Ambulatory Visit: Admitting: Family Medicine

## 2023-12-23 ENCOUNTER — Ambulatory Visit

## 2023-12-23 ENCOUNTER — Encounter: Payer: Self-pay | Admitting: Family Medicine

## 2023-12-23 VITALS — BP 126/64 | HR 89 | Temp 98.2°F | Resp 20 | Wt 238.4 lb

## 2023-12-23 DIAGNOSIS — H1013 Acute atopic conjunctivitis, bilateral: Secondary | ICD-10-CM | POA: Diagnosis not present

## 2023-12-23 DIAGNOSIS — J4550 Severe persistent asthma, uncomplicated: Secondary | ICD-10-CM | POA: Diagnosis not present

## 2023-12-23 DIAGNOSIS — H1010 Acute atopic conjunctivitis, unspecified eye: Secondary | ICD-10-CM

## 2023-12-23 DIAGNOSIS — J3089 Other allergic rhinitis: Secondary | ICD-10-CM

## 2023-12-23 DIAGNOSIS — K219 Gastro-esophageal reflux disease without esophagitis: Secondary | ICD-10-CM

## 2023-12-23 MED ORDER — EPINEPHRINE 0.3 MG/0.3ML IJ SOAJ: INTRAMUSCULAR | 1 refills | Status: AC

## 2023-12-23 MED ORDER — ALBUTEROL SULFATE HFA 108 (90 BASE) MCG/ACT IN AERS: 2.0000 | INHALATION_SPRAY | RESPIRATORY_TRACT | 3 refills | Status: AC | PRN

## 2023-12-23 NOTE — Addendum Note (Signed)
 Addended by: NORA SAM on: 12/23/2023 01:36 PM   Modules accepted: Orders

## 2024-01-21 ENCOUNTER — Ambulatory Visit (INDEPENDENT_AMBULATORY_CARE_PROVIDER_SITE_OTHER)

## 2024-01-21 DIAGNOSIS — J302 Other seasonal allergic rhinitis: Secondary | ICD-10-CM | POA: Diagnosis not present

## 2024-01-21 DIAGNOSIS — J309 Allergic rhinitis, unspecified: Secondary | ICD-10-CM

## 2024-02-17 ENCOUNTER — Ambulatory Visit

## 2024-02-17 ENCOUNTER — Telehealth: Payer: Self-pay

## 2024-02-17 NOTE — Telephone Encounter (Signed)
 Patient called to find out if she was getting her fasenra  or allergy shots. Called her back because of the rush in injection room. Returned patient's call and advised her fasenra  is tomorrow. Pt allergy shot is due this week and she said she received both at her last office visit but I didn't see in note. Please advise if patient can get both on same day since she is on her maintenance vial now.

## 2024-02-17 NOTE — Telephone Encounter (Signed)
 Both on same day is ok. Thank you

## 2024-02-18 ENCOUNTER — Ambulatory Visit

## 2024-02-18 DIAGNOSIS — J4550 Severe persistent asthma, uncomplicated: Secondary | ICD-10-CM | POA: Diagnosis not present

## 2024-02-18 DIAGNOSIS — J302 Other seasonal allergic rhinitis: Secondary | ICD-10-CM

## 2024-04-14 ENCOUNTER — Ambulatory Visit

## 2024-06-22 ENCOUNTER — Ambulatory Visit: Admitting: Family Medicine
